# Patient Record
Sex: Female | Born: 1945 | Race: Black or African American | Hispanic: No | Marital: Single | Smoking: Never smoker
Health system: Southern US, Community
[De-identification: ages and names within clinical notes are randomized; demographics above are authoritative.]

## PROBLEM LIST (undated history)

## (undated) DIAGNOSIS — I639 Cerebral infarction, unspecified: Secondary | ICD-10-CM

## (undated) DIAGNOSIS — E119 Type 2 diabetes mellitus without complications: Secondary | ICD-10-CM

## (undated) DIAGNOSIS — I10 Essential (primary) hypertension: Secondary | ICD-10-CM

## (undated) HISTORY — PX: ABDOMINAL HYSTERECTOMY: SHX81

## (undated) HISTORY — PX: GALLBLADDER SURGERY: SHX652

---

## 2000-03-01 ENCOUNTER — Encounter: Admission: RE | Admit: 2000-03-01 | Discharge: 2000-03-01 | Payer: Self-pay | Admitting: Emergency Medicine

## 2000-03-01 ENCOUNTER — Encounter: Payer: Self-pay | Admitting: Emergency Medicine

## 2001-12-17 ENCOUNTER — Emergency Department (HOSPITAL_COMMUNITY): Admission: EM | Admit: 2001-12-17 | Discharge: 2001-12-17 | Payer: Self-pay | Admitting: Emergency Medicine

## 2001-12-17 ENCOUNTER — Encounter: Payer: Self-pay | Admitting: Emergency Medicine

## 2004-11-06 ENCOUNTER — Other Ambulatory Visit: Admission: RE | Admit: 2004-11-06 | Discharge: 2004-11-06 | Payer: Self-pay | Admitting: Internal Medicine

## 2004-11-09 ENCOUNTER — Encounter: Admission: RE | Admit: 2004-11-09 | Discharge: 2004-11-09 | Payer: Self-pay | Admitting: Internal Medicine

## 2005-05-22 ENCOUNTER — Ambulatory Visit (HOSPITAL_COMMUNITY): Admission: RE | Admit: 2005-05-22 | Discharge: 2005-05-22 | Payer: Self-pay | Admitting: Gastroenterology

## 2006-01-29 ENCOUNTER — Encounter: Admission: RE | Admit: 2006-01-29 | Discharge: 2006-01-29 | Payer: Self-pay | Admitting: Internal Medicine

## 2006-02-11 ENCOUNTER — Encounter: Admission: RE | Admit: 2006-02-11 | Discharge: 2006-02-11 | Payer: Self-pay | Admitting: Internal Medicine

## 2006-03-25 ENCOUNTER — Encounter: Admission: RE | Admit: 2006-03-25 | Discharge: 2006-03-25 | Payer: Self-pay | Admitting: General Surgery

## 2006-03-27 ENCOUNTER — Encounter (INDEPENDENT_AMBULATORY_CARE_PROVIDER_SITE_OTHER): Payer: Self-pay | Admitting: Specialist

## 2006-03-27 ENCOUNTER — Ambulatory Visit (HOSPITAL_BASED_OUTPATIENT_CLINIC_OR_DEPARTMENT_OTHER): Admission: RE | Admit: 2006-03-27 | Discharge: 2006-03-27 | Payer: Self-pay | Admitting: General Surgery

## 2007-03-21 ENCOUNTER — Encounter: Admission: RE | Admit: 2007-03-21 | Discharge: 2007-03-21 | Payer: Self-pay | Admitting: Internal Medicine

## 2008-01-15 ENCOUNTER — Emergency Department (HOSPITAL_COMMUNITY): Admission: EM | Admit: 2008-01-15 | Discharge: 2008-01-15 | Payer: Self-pay | Admitting: Emergency Medicine

## 2009-09-10 ENCOUNTER — Emergency Department (HOSPITAL_COMMUNITY)
Admission: EM | Admit: 2009-09-10 | Discharge: 2009-09-10 | Payer: Self-pay | Source: Home / Self Care | Admitting: Emergency Medicine

## 2010-06-27 ENCOUNTER — Inpatient Hospital Stay (HOSPITAL_COMMUNITY)
Admission: EM | Admit: 2010-06-27 | Discharge: 2010-06-30 | Disposition: A | Discharge status: Rehabilitation Facility | Payer: Self-pay | Source: Home / Self Care | Attending: Internal Medicine | Admitting: Internal Medicine

## 2010-06-28 ENCOUNTER — Encounter (INDEPENDENT_AMBULATORY_CARE_PROVIDER_SITE_OTHER): Payer: Self-pay | Admitting: Internal Medicine

## 2010-06-30 ENCOUNTER — Inpatient Hospital Stay (HOSPITAL_COMMUNITY)
Admission: RE | Admit: 2010-06-30 | Discharge: 2010-07-25 | Payer: Self-pay | Attending: Physical Medicine & Rehabilitation | Admitting: Physical Medicine & Rehabilitation

## 2010-07-19 LAB — GLUCOSE, CAPILLARY
Glucose-Capillary: 113 mg/dL — ABNORMAL HIGH (ref 70–99)
Glucose-Capillary: 169 mg/dL — ABNORMAL HIGH (ref 70–99)
Glucose-Capillary: 111 mg/dL — ABNORMAL HIGH (ref 70–99)
Glucose-Capillary: 107 mg/dL — ABNORMAL HIGH (ref 70–99)

## 2010-07-20 LAB — GLUCOSE, CAPILLARY
Glucose-Capillary: 109 mg/dL — ABNORMAL HIGH (ref 70–99)
Glucose-Capillary: 106 mg/dL — ABNORMAL HIGH (ref 70–99)
Glucose-Capillary: 95 mg/dL (ref 70–99)
Glucose-Capillary: 169 mg/dL — ABNORMAL HIGH (ref 70–99)

## 2010-07-21 LAB — GLUCOSE, CAPILLARY
Glucose-Capillary: 124 mg/dL — ABNORMAL HIGH (ref 70–99)
Glucose-Capillary: 87 mg/dL (ref 70–99)

## 2010-07-31 LAB — BASIC METABOLIC PANEL
BUN: 22 mg/dL (ref 6–23)
CO2: 28 mEq/L (ref 19–32)
Calcium: 9.5 mg/dL (ref 8.4–10.5)
Chloride: 108 mEq/L (ref 96–112)
Creatinine, Ser: 0.96 mg/dL (ref 0.4–1.2)
GFR calc Af Amer: >60 mL/min (ref >60)
GFR calc non Af Amer: 59 mL/min — ABNORMAL LOW (ref >60)
Glucose, Bld: 110 mg/dL — ABNORMAL HIGH (ref 70–99)
Potassium: 4.1 mEq/L (ref 3.5–5.1)
Sodium: 140 mEq/L (ref 135–145)

## 2010-07-31 LAB — URINE MICROSCOPIC-ADD ON

## 2010-07-31 LAB — GLUCOSE, CAPILLARY
Glucose-Capillary: 98 mg/dL (ref 70–99)
Glucose-Capillary: 137 mg/dL — ABNORMAL HIGH (ref 70–99)
Glucose-Capillary: 144 mg/dL — ABNORMAL HIGH (ref 70–99)
Glucose-Capillary: 118 mg/dL — ABNORMAL HIGH (ref 70–99)
Glucose-Capillary: 94 mg/dL (ref 70–99)

## 2010-07-31 LAB — URINALYSIS, ROUTINE W REFLEX MICROSCOPIC
Bilirubin Urine: NEGATIVE
Hgb urine dipstick: NEGATIVE
Ketones, ur: NEGATIVE mg/dL
Nitrite: NEGATIVE
Protein, ur: NEGATIVE mg/dL
Specific Gravity, Urine: 1.021 (ref 1.005–1.030)
Urine Glucose, Fasting: NEGATIVE mg/dL
Urobilinogen, UA: 0.2 mg/dL (ref 0.0–1.0)
pH: 5.5 (ref 5.0–8.0)

## 2010-07-31 LAB — URINE CULTURE
Colony Count: NO GROWTH
Culture  Setup Time: 201201091442
Culture: NO GROWTH

## 2010-08-24 ENCOUNTER — Inpatient Hospital Stay: Payer: Self-pay | Admitting: Physical Medicine & Rehabilitation

## 2010-08-24 ENCOUNTER — Encounter: Payer: Self-pay | Attending: Physical Medicine & Rehabilitation

## 2010-09-25 LAB — GLUCOSE, CAPILLARY
Glucose-Capillary: 102 mg/dL — ABNORMAL HIGH (ref 70–99)
Glucose-Capillary: 103 mg/dL — ABNORMAL HIGH (ref 70–99)
Glucose-Capillary: 109 mg/dL — ABNORMAL HIGH (ref 70–99)
Glucose-Capillary: 110 mg/dL — ABNORMAL HIGH (ref 70–99)
Glucose-Capillary: 114 mg/dL — ABNORMAL HIGH (ref 70–99)
Glucose-Capillary: 116 mg/dL — ABNORMAL HIGH (ref 70–99)
Glucose-Capillary: 117 mg/dL — ABNORMAL HIGH (ref 70–99)
Glucose-Capillary: 118 mg/dL — ABNORMAL HIGH (ref 70–99)
Glucose-Capillary: 122 mg/dL — ABNORMAL HIGH (ref 70–99)
Glucose-Capillary: 125 mg/dL — ABNORMAL HIGH (ref 70–99)
Glucose-Capillary: 125 mg/dL — ABNORMAL HIGH (ref 70–99)
Glucose-Capillary: 126 mg/dL — ABNORMAL HIGH (ref 70–99)
Glucose-Capillary: 131 mg/dL — ABNORMAL HIGH (ref 70–99)
Glucose-Capillary: 134 mg/dL — ABNORMAL HIGH (ref 70–99)
Glucose-Capillary: 138 mg/dL — ABNORMAL HIGH (ref 70–99)
Glucose-Capillary: 141 mg/dL — ABNORMAL HIGH (ref 70–99)
Glucose-Capillary: 142 mg/dL — ABNORMAL HIGH (ref 70–99)
Glucose-Capillary: 143 mg/dL — ABNORMAL HIGH (ref 70–99)
Glucose-Capillary: 143 mg/dL — ABNORMAL HIGH (ref 70–99)
Glucose-Capillary: 146 mg/dL — ABNORMAL HIGH (ref 70–99)
Glucose-Capillary: 147 mg/dL — ABNORMAL HIGH (ref 70–99)
Glucose-Capillary: 148 mg/dL — ABNORMAL HIGH (ref 70–99)
Glucose-Capillary: 149 mg/dL — ABNORMAL HIGH (ref 70–99)
Glucose-Capillary: 150 mg/dL — ABNORMAL HIGH (ref 70–99)
Glucose-Capillary: 152 mg/dL — ABNORMAL HIGH (ref 70–99)
Glucose-Capillary: 154 mg/dL — ABNORMAL HIGH (ref 70–99)
Glucose-Capillary: 155 mg/dL — ABNORMAL HIGH (ref 70–99)
Glucose-Capillary: 156 mg/dL — ABNORMAL HIGH (ref 70–99)
Glucose-Capillary: 159 mg/dL — ABNORMAL HIGH (ref 70–99)
Glucose-Capillary: 160 mg/dL — ABNORMAL HIGH (ref 70–99)
Glucose-Capillary: 160 mg/dL — ABNORMAL HIGH (ref 70–99)
Glucose-Capillary: 160 mg/dL — ABNORMAL HIGH (ref 70–99)
Glucose-Capillary: 165 mg/dL — ABNORMAL HIGH (ref 70–99)
Glucose-Capillary: 166 mg/dL — ABNORMAL HIGH (ref 70–99)
Glucose-Capillary: 166 mg/dL — ABNORMAL HIGH (ref 70–99)
Glucose-Capillary: 171 mg/dL — ABNORMAL HIGH (ref 70–99)
Glucose-Capillary: 173 mg/dL — ABNORMAL HIGH (ref 70–99)
Glucose-Capillary: 175 mg/dL — ABNORMAL HIGH (ref 70–99)
Glucose-Capillary: 177 mg/dL — ABNORMAL HIGH (ref 70–99)
Glucose-Capillary: 183 mg/dL — ABNORMAL HIGH (ref 70–99)
Glucose-Capillary: 185 mg/dL — ABNORMAL HIGH (ref 70–99)
Glucose-Capillary: 191 mg/dL — ABNORMAL HIGH (ref 70–99)
Glucose-Capillary: 192 mg/dL — ABNORMAL HIGH (ref 70–99)
Glucose-Capillary: 195 mg/dL — ABNORMAL HIGH (ref 70–99)
Glucose-Capillary: 195 mg/dL — ABNORMAL HIGH (ref 70–99)
Glucose-Capillary: 217 mg/dL — ABNORMAL HIGH (ref 70–99)
Glucose-Capillary: 218 mg/dL — ABNORMAL HIGH (ref 70–99)
Glucose-Capillary: 79 mg/dL (ref 70–99)

## 2010-09-25 LAB — CK TOTAL AND CKMB (NOT AT ARMC)
CK, MB: 2.2 ng/mL (ref 0.3–4.0)
Total CK: 153 U/L (ref 7–177)

## 2010-09-25 LAB — URINALYSIS, ROUTINE W REFLEX MICROSCOPIC
Glucose, UA: NEGATIVE mg/dL
Specific Gravity, Urine: 1.017 (ref 1.005–1.030)
pH: 6 (ref 5.0–8.0)

## 2010-09-25 LAB — BASIC METABOLIC PANEL
BUN: 16 mg/dL (ref 6–23)
BUN: 22 mg/dL (ref 6–23)
BUN: 8 mg/dL (ref 6–23)
CO2: 26 mEq/L (ref 19–32)
CO2: 28 mEq/L (ref 19–32)
Chloride: 104 mEq/L (ref 96–112)
Chloride: 105 mEq/L (ref 96–112)
Chloride: 107 mEq/L (ref 96–112)
Creatinine, Ser: 0.88 mg/dL (ref 0.4–1.2)
GFR calc non Af Amer: 53 mL/min — ABNORMAL LOW (ref >60)
GFR calc non Af Amer: >60 mL/min (ref >60)
Glucose, Bld: 126 mg/dL — ABNORMAL HIGH (ref 70–99)
Glucose, Bld: 139 mg/dL — ABNORMAL HIGH (ref 70–99)
Glucose, Bld: 150 mg/dL — ABNORMAL HIGH (ref 70–99)
Potassium: 3.7 mEq/L (ref 3.5–5.1)
Potassium: 4.3 mEq/L (ref 3.5–5.1)
Sodium: 139 mEq/L (ref 135–145)
Sodium: 141 mEq/L (ref 135–145)

## 2010-09-25 LAB — DIFFERENTIAL
Eosinophils Absolute: 0 10*3/uL (ref 0.0–0.7)
Eosinophils Absolute: 0.1 10*3/uL (ref 0.0–0.7)
Eosinophils Relative: 2 % (ref 0–5)
Lymphocytes Relative: 31 % (ref 12–46)
Lymphocytes Relative: 35 % (ref 12–46)
Lymphs Abs: 2.3 10*3/uL (ref 0.7–4.0)
Monocytes Absolute: 0.3 10*3/uL (ref 0.1–1.0)
Monocytes Absolute: 0.5 10*3/uL (ref 0.1–1.0)
Monocytes Relative: 7 % (ref 3–12)
Neutrophils Relative %: 65 % (ref 43–77)
Smear Review: ADEQUATE

## 2010-09-25 LAB — URINE CULTURE
Colony Count: NO GROWTH
Culture  Setup Time: 201112130011
Culture  Setup Time: 201112170306
Culture: NO GROWTH

## 2010-09-25 LAB — COMPREHENSIVE METABOLIC PANEL
ALT: 14 U/L (ref 0–35)
ALT: 15 U/L (ref 0–35)
AST: 16 U/L (ref 0–37)
AST: 23 U/L (ref 0–37)
Albumin: 2.9 g/dL — ABNORMAL LOW (ref 3.5–5.2)
Albumin: 2.9 g/dL — ABNORMAL LOW (ref 3.5–5.2)
Alkaline Phosphatase: 93 U/L (ref 39–117)
BUN: 14 mg/dL (ref 6–23)
CO2: 27 mEq/L (ref 19–32)
CO2: 28 mEq/L (ref 19–32)
Calcium: 9.4 mg/dL (ref 8.4–10.5)
Calcium: 9.5 mg/dL (ref 8.4–10.5)
Chloride: 102 mEq/L (ref 96–112)
Creatinine, Ser: 0.82 mg/dL (ref 0.4–1.2)
GFR calc Af Amer: >60 mL/min (ref >60)
GFR calc Af Amer: >60 mL/min (ref >60)
GFR calc non Af Amer: 54 mL/min — ABNORMAL LOW (ref >60)
GFR calc non Af Amer: 56 mL/min — ABNORMAL LOW (ref >60)
Glucose, Bld: 180 mg/dL — ABNORMAL HIGH (ref 70–99)
Potassium: 2.9 mEq/L — ABNORMAL LOW (ref 3.5–5.1)
Sodium: 140 mEq/L (ref 135–145)
Sodium: 142 mEq/L (ref 135–145)
Total Bilirubin: 0.5 mg/dL (ref 0.3–1.2)
Total Protein: 6.8 g/dL (ref 6.0–8.3)

## 2010-09-25 LAB — URINALYSIS, MICROSCOPIC ONLY
Bilirubin Urine: NEGATIVE
Bilirubin Urine: NEGATIVE
Glucose, UA: NEGATIVE mg/dL
Glucose, UA: NEGATIVE mg/dL
Nitrite: NEGATIVE
Specific Gravity, Urine: 1.023 (ref 1.005–1.030)
Specific Gravity, Urine: 1.023 (ref 1.005–1.030)
Urobilinogen, UA: 1 mg/dL (ref 0.0–1.0)
pH: 5.5 (ref 5.0–8.0)
pH: 6 (ref 5.0–8.0)

## 2010-09-25 LAB — CBC
Hemoglobin: 11.6 g/dL — ABNORMAL LOW (ref 12.0–15.0)
MCH: 22.8 pg — ABNORMAL LOW (ref 26.0–34.0)
MCH: 23.3 pg — ABNORMAL LOW (ref 26.0–34.0)
MCH: 23.8 pg — ABNORMAL LOW (ref 26.0–34.0)
MCHC: 33.9 g/dL (ref 30.0–36.0)
MCV: 69.5 fL — ABNORMAL LOW (ref 78.0–100.0)
Platelets: 146 10*3/uL — ABNORMAL LOW (ref 150–400)
Platelets: 160 10*3/uL (ref 150–400)
Platelets: 166 10*3/uL (ref 150–400)
Platelets: 177 10*3/uL (ref 150–400)
RBC: 4.45 MIL/uL (ref 3.87–5.11)
RBC: 4.85 MIL/uL (ref 3.87–5.11)
RBC: 4.92 MIL/uL (ref 3.87–5.11)
RDW: 17.9 % — ABNORMAL HIGH (ref 11.5–15.5)
WBC: 7.3 10*3/uL (ref 4.0–10.5)
WBC: 8 10*3/uL (ref 4.0–10.5)

## 2010-09-25 LAB — MAGNESIUM: Magnesium: 1.8 mg/dL (ref 1.5–2.5)

## 2010-09-25 LAB — CARDIAC PANEL(CRET KIN+CKTOT+MB+TROPI)
CK, MB: 1.4 ng/mL (ref 0.3–4.0)
Relative Index: 1.3 (ref 0.0–2.5)
Total CK: 108 U/L (ref 7–177)

## 2010-09-25 LAB — PROTIME-INR
INR: 1.11 (ref 0.00–1.49)
Prothrombin Time: 14.5 seconds (ref 11.6–15.2)

## 2010-11-17 ENCOUNTER — Ambulatory Visit: Payer: Medicare Other | Attending: Internal Medicine | Admitting: Rehabilitative and Restorative Service Providers"

## 2010-11-17 DIAGNOSIS — IMO0001 Reserved for inherently not codable concepts without codable children: Secondary | ICD-10-CM | POA: Insufficient documentation

## 2010-11-17 DIAGNOSIS — R269 Unspecified abnormalities of gait and mobility: Secondary | ICD-10-CM | POA: Insufficient documentation

## 2010-11-17 DIAGNOSIS — M6281 Muscle weakness (generalized): Secondary | ICD-10-CM | POA: Insufficient documentation

## 2010-11-24 ENCOUNTER — Ambulatory Visit: Payer: Medicare Other | Admitting: *Deleted

## 2010-11-28 ENCOUNTER — Ambulatory Visit: Payer: Medicare Other | Admitting: Rehabilitative and Restorative Service Providers"

## 2010-11-29 ENCOUNTER — Ambulatory Visit: Payer: Medicare Other | Admitting: Rehabilitative and Restorative Service Providers"

## 2010-12-01 ENCOUNTER — Ambulatory Visit: Payer: Medicare Other | Admitting: Rehabilitative and Restorative Service Providers"

## 2010-12-01 NOTE — Op Note (Signed)
Laurie Hunter, Laurie Hunter                ACCOUNT NO.:  [id number]   MEDICAL RECORD NO.:  [id number]          PATIENT TYPE:  AMB   LOCATION:  DSC                          FACILITY:  [facility]   PHYSICIAN:  Leonie Man, M.D.   DATE OF BIRTH:  [birth year]   DATE OF PROCEDURE:  03/27/2006  DATE OF DISCHARGE:                                 OPERATIVE REPORT   PREOPERATIVE DIAGNOSIS:  Duct papillomatosis, left breast.   POSTOPERATIVE DIAGNOSIS:  Duct papillomatosis, left breast.   PROCEDURE:  Major duct excision, left breast.   SURGEON:  Dr. Leonie Man.   ASSISTANT:  OR nurse.   ANESTHESIA:  General.   INDICATIONS:  The patient is a 65 year old female who presents with a clear  left-sided nipple discharge which appeared spontaneously. She underwent  ductogram which showed a papilloma or filling defects within the major ducts  of the left breast.  She comes to the operating room now for duct excision  to rule out a papillary carcinoma.   DESCRIPTION OF PROCEDURE:  Following the induction of satisfactory general  anesthesia with the patient positioned supinely, the left breast was prepped  and draped to be included in a sterile operative field. An inferiorly placed  circumareolar incision is made deep and through the skin and subcutaneous  tissue with the nipple lifted superiorly as a flap. All of the ducts below  the nipple are taken down and dissection carried down to the central portion  of the breast for approximately 5-1/2 cm. The specimen was removed in its  entirety and forwarded for pathologic evaluation.  Hemostasis obtained with  electrocautery.  Sponge and instrument counts verified.  The breast tissue  is reapproximated with two concentric pursestring sutures pulling the breast  tissues together.  The subcutaneous tissues are then closed with interrupted  3-0 Vicryl suture. The skin is closed with running 5-0 Monocryl suture.  The  wound is reinforced with Steri-Strips,  sterile dressing is applied.  The  anesthetic reversed and the patient removed from the operating room to the  recovery room in stable condition.  He tolerated the procedure well.      Leonie Man, M.D.  Electronically Signed     PB/MEDQ  D:  03/27/2006  T:  03/27/2006  Job:  [id number]

## 2010-12-01 NOTE — Op Note (Signed)
NAMEXIAN, APOSTOL                ACCOUNT NO.:  [id number]   MEDICAL RECORD NO.:  [id number]          PATIENT TYPE:  AMB   LOCATION:  ENDO                         FACILITY:  [facility]   PHYSICIAN:  Danise Edge, M.D.   DATE OF BIRTH:  1945/07/22   DATE OF PROCEDURE:  05/22/2005  DATE OF DISCHARGE:                                 OPERATIVE REPORT   PROCEDURE:  Screening colonoscopy.   INDICATIONS:  Ms. Nisa Decaire is a 65 year old female born [birth year].  Ms. Mcquilkin is scheduled to undergo her first screening colonoscopy with  polypectomy to prevent colon cancer.   ENDOSCOPIST:  Danise Edge, M.D.   PREMEDICATION:  Versed 5 mg, Demerol 50 mg.   DESCRIPTION OF PROCEDURE:  After obtaining informed consent, Ms. Matarese was  placed in the left lateral decubitus position. I administered intravenous  Demerol and intravenous Versed to achieve conscious sedation for the  procedure. The patient's blood pressure, oxygen saturation and cardiac  rhythm were monitored throughout the procedure and documented in the medical  record.   Anal inspection and digital rectal exam were normal. The Olympus adjustable  pediatric colonoscope was introduced into the rectum and easily advanced to  the cecum. Colonic preparation was fair. The cecum was filled with solid  fecal matter. Wight vigorous water irrigation, I was able to inspect the  vast majority of the cecum; small polyps could easily have been missed.  There was also a large amount of solid waste in the sigmoid colon and small  polyps could have be missed in this area.   RECTUM:  Normal. Retroflexed view of the distal rectum normal.  SIGMOID COLON AND DESCENDING COLON:  Normal.  SPLENIC FLEXURE:  Normal.  TRANSVERSE COLON:  Normal.  HEPATIC FLEXURE:  Normal.  ASCENDING COLON:  Normal.  CECUM AND ILEOCECAL VALVE:  Normal.   ASSESSMENT:  Normal screening proctocolonoscopy to the cecum.           ______________________________  Danise Edge, M.D.    MJ/MEDQ  D:  05/22/2005  T:  05/22/2005  Job:  [id number]   cc:   Georgann Housekeeper, MD  Fax: [phone]

## 2010-12-04 ENCOUNTER — Ambulatory Visit: Payer: Medicare Other | Admitting: Rehabilitative and Restorative Service Providers"

## 2010-12-06 ENCOUNTER — Ambulatory Visit: Payer: Medicare Other | Admitting: Occupational Therapy

## 2010-12-06 ENCOUNTER — Ambulatory Visit: Payer: Medicare Other | Admitting: Rehabilitative and Restorative Service Providers"

## 2010-12-08 ENCOUNTER — Ambulatory Visit: Payer: Medicare Other | Admitting: Rehabilitative and Restorative Service Providers"

## 2010-12-12 ENCOUNTER — Ambulatory Visit: Payer: Medicare Other | Admitting: Rehabilitative and Restorative Service Providers"

## 2010-12-13 ENCOUNTER — Ambulatory Visit: Payer: Medicare Other | Admitting: Occupational Therapy

## 2010-12-13 ENCOUNTER — Encounter: Payer: Medicare Other | Admitting: Occupational Therapy

## 2010-12-13 ENCOUNTER — Ambulatory Visit: Payer: Medicare Other | Admitting: Rehabilitative and Restorative Service Providers"

## 2010-12-15 ENCOUNTER — Ambulatory Visit: Payer: Medicare Other | Admitting: Occupational Therapy

## 2010-12-15 ENCOUNTER — Ambulatory Visit: Payer: Medicare Other | Attending: Internal Medicine | Admitting: Rehabilitative and Restorative Service Providers"

## 2010-12-15 DIAGNOSIS — IMO0001 Reserved for inherently not codable concepts without codable children: Secondary | ICD-10-CM | POA: Insufficient documentation

## 2010-12-15 DIAGNOSIS — R269 Unspecified abnormalities of gait and mobility: Secondary | ICD-10-CM | POA: Insufficient documentation

## 2010-12-15 DIAGNOSIS — M6281 Muscle weakness (generalized): Secondary | ICD-10-CM | POA: Insufficient documentation

## 2010-12-18 ENCOUNTER — Ambulatory Visit: Payer: Medicare Other | Admitting: Rehabilitative and Restorative Service Providers"

## 2010-12-18 ENCOUNTER — Ambulatory Visit: Payer: Medicare Other | Admitting: Occupational Therapy

## 2010-12-20 ENCOUNTER — Ambulatory Visit: Payer: Medicare Other | Admitting: Occupational Therapy

## 2010-12-20 ENCOUNTER — Ambulatory Visit: Payer: Medicare Other | Admitting: Rehabilitative and Restorative Service Providers"

## 2010-12-21 ENCOUNTER — Ambulatory Visit: Payer: Medicare Other | Admitting: Occupational Therapy

## 2010-12-21 ENCOUNTER — Ambulatory Visit: Payer: Medicare Other | Admitting: Rehabilitative and Restorative Service Providers"

## 2010-12-25 ENCOUNTER — Ambulatory Visit: Payer: Medicare Other | Admitting: Occupational Therapy

## 2010-12-25 ENCOUNTER — Ambulatory Visit: Payer: Medicare Other | Admitting: Rehabilitative and Restorative Service Providers"

## 2010-12-26 ENCOUNTER — Ambulatory Visit: Payer: Medicare Other | Admitting: Occupational Therapy

## 2010-12-27 ENCOUNTER — Ambulatory Visit: Payer: Medicare Other | Admitting: Occupational Therapy

## 2010-12-27 ENCOUNTER — Ambulatory Visit: Payer: Medicare Other | Admitting: Rehabilitative and Restorative Service Providers"

## 2010-12-28 ENCOUNTER — Ambulatory Visit: Payer: Medicare Other | Admitting: Physical Therapy

## 2010-12-28 ENCOUNTER — Encounter: Payer: Medicare Other | Admitting: Occupational Therapy

## 2011-01-01 ENCOUNTER — Ambulatory Visit: Payer: Medicare Other | Admitting: Rehabilitative and Restorative Service Providers"

## 2011-01-01 ENCOUNTER — Ambulatory Visit: Payer: Medicare Other | Admitting: Occupational Therapy

## 2011-01-03 ENCOUNTER — Ambulatory Visit: Payer: Medicare Other | Admitting: Occupational Therapy

## 2011-01-03 ENCOUNTER — Ambulatory Visit: Payer: Medicare Other | Admitting: Rehabilitative and Restorative Service Providers"

## 2011-01-05 ENCOUNTER — Ambulatory Visit: Payer: Medicare Other | Admitting: Occupational Therapy

## 2011-01-05 ENCOUNTER — Ambulatory Visit: Payer: Medicare Other | Admitting: Rehabilitative and Restorative Service Providers"

## 2011-01-10 ENCOUNTER — Ambulatory Visit: Payer: Medicare Other | Admitting: Rehabilitative and Restorative Service Providers"

## 2011-01-11 ENCOUNTER — Ambulatory Visit: Payer: Medicare Other | Admitting: Occupational Therapy

## 2011-01-12 ENCOUNTER — Ambulatory Visit: Payer: Medicare Other | Admitting: Rehabilitative and Restorative Service Providers"

## 2011-01-15 ENCOUNTER — Ambulatory Visit: Payer: Medicare Other | Attending: Internal Medicine | Admitting: Occupational Therapy

## 2011-01-15 DIAGNOSIS — M6281 Muscle weakness (generalized): Secondary | ICD-10-CM | POA: Insufficient documentation

## 2011-01-15 DIAGNOSIS — R269 Unspecified abnormalities of gait and mobility: Secondary | ICD-10-CM | POA: Insufficient documentation

## 2011-01-15 DIAGNOSIS — IMO0001 Reserved for inherently not codable concepts without codable children: Secondary | ICD-10-CM | POA: Insufficient documentation

## 2011-01-19 ENCOUNTER — Ambulatory Visit: Payer: Medicare Other | Admitting: Physical Therapy

## 2011-01-23 ENCOUNTER — Ambulatory Visit: Payer: Medicare Other | Admitting: Occupational Therapy

## 2011-01-23 ENCOUNTER — Ambulatory Visit: Payer: Medicare Other | Admitting: Rehabilitative and Restorative Service Providers"

## 2011-01-24 ENCOUNTER — Ambulatory Visit: Payer: Medicare Other | Admitting: Rehabilitative and Restorative Service Providers"

## 2011-01-25 ENCOUNTER — Ambulatory Visit: Payer: Medicare Other | Admitting: Rehabilitative and Restorative Service Providers"

## 2011-01-30 ENCOUNTER — Ambulatory Visit: Payer: Medicare Other | Admitting: Rehabilitative and Restorative Service Providers"

## 2011-02-01 ENCOUNTER — Ambulatory Visit: Payer: Medicare Other | Admitting: Rehabilitative and Restorative Service Providers"

## 2011-02-07 ENCOUNTER — Ambulatory Visit: Payer: Medicare Other | Admitting: Physical Therapy

## 2011-02-07 ENCOUNTER — Ambulatory Visit: Payer: Medicare Other | Admitting: Occupational Therapy

## 2011-02-09 ENCOUNTER — Ambulatory Visit: Payer: Medicare Other | Admitting: Occupational Therapy

## 2011-02-09 ENCOUNTER — Ambulatory Visit: Payer: Medicare Other | Admitting: Rehabilitative and Restorative Service Providers"

## 2011-02-13 ENCOUNTER — Ambulatory Visit: Payer: Medicare Other | Admitting: Occupational Therapy

## 2011-02-13 ENCOUNTER — Ambulatory Visit: Payer: Medicare Other | Admitting: Rehabilitative and Restorative Service Providers"

## 2011-02-15 ENCOUNTER — Ambulatory Visit: Payer: Medicare Other | Attending: Internal Medicine | Admitting: Occupational Therapy

## 2011-02-15 ENCOUNTER — Ambulatory Visit: Payer: Medicare Other | Admitting: Physical Therapy

## 2011-02-15 DIAGNOSIS — R269 Unspecified abnormalities of gait and mobility: Secondary | ICD-10-CM | POA: Insufficient documentation

## 2011-02-15 DIAGNOSIS — M6281 Muscle weakness (generalized): Secondary | ICD-10-CM | POA: Insufficient documentation

## 2011-02-15 DIAGNOSIS — IMO0001 Reserved for inherently not codable concepts without codable children: Secondary | ICD-10-CM | POA: Insufficient documentation

## 2011-02-20 ENCOUNTER — Ambulatory Visit: Payer: Medicare Other | Admitting: Occupational Therapy

## 2011-02-20 ENCOUNTER — Ambulatory Visit: Payer: Medicare Other | Admitting: Rehabilitative and Restorative Service Providers"

## 2011-02-22 ENCOUNTER — Ambulatory Visit: Payer: Medicare Other | Admitting: Occupational Therapy

## 2011-02-26 ENCOUNTER — Ambulatory Visit: Payer: Medicare Other | Admitting: Rehabilitative and Restorative Service Providers"

## 2011-02-27 ENCOUNTER — Ambulatory Visit: Payer: Medicare Other | Admitting: Rehabilitative and Restorative Service Providers"

## 2011-03-01 ENCOUNTER — Ambulatory Visit: Payer: Medicare Other | Admitting: Physical Therapy

## 2011-03-02 ENCOUNTER — Ambulatory Visit: Payer: Medicare Other | Admitting: Physical Therapy

## 2011-03-06 ENCOUNTER — Ambulatory Visit: Payer: Medicare Other | Admitting: Rehabilitative and Restorative Service Providers"

## 2011-03-07 ENCOUNTER — Ambulatory Visit: Payer: Medicare Other | Admitting: Physical Therapy

## 2011-03-09 ENCOUNTER — Ambulatory Visit: Payer: Medicare Other | Admitting: Rehabilitative and Restorative Service Providers"

## 2011-03-13 ENCOUNTER — Ambulatory Visit: Payer: Medicare Other | Admitting: Rehabilitative and Restorative Service Providers"

## 2011-03-16 ENCOUNTER — Ambulatory Visit: Payer: Medicare Other | Admitting: Rehabilitative and Restorative Service Providers"

## 2011-03-22 ENCOUNTER — Ambulatory Visit: Payer: Medicare Other | Admitting: Rehabilitative and Restorative Service Providers"

## 2011-07-23 ENCOUNTER — Ambulatory Visit: Payer: Medicare Other | Attending: Internal Medicine | Admitting: Rehabilitative and Restorative Service Providers"

## 2011-07-23 DIAGNOSIS — IMO0001 Reserved for inherently not codable concepts without codable children: Secondary | ICD-10-CM | POA: Insufficient documentation

## 2011-07-23 DIAGNOSIS — M6281 Muscle weakness (generalized): Secondary | ICD-10-CM | POA: Insufficient documentation

## 2011-07-23 DIAGNOSIS — R269 Unspecified abnormalities of gait and mobility: Secondary | ICD-10-CM | POA: Insufficient documentation

## 2011-07-31 ENCOUNTER — Ambulatory Visit: Payer: Medicare Other | Admitting: Physical Therapy

## 2011-08-01 ENCOUNTER — Ambulatory Visit: Payer: Medicare Other | Admitting: Rehabilitative and Restorative Service Providers"

## 2011-08-07 ENCOUNTER — Ambulatory Visit: Payer: Medicare Other | Admitting: Physical Therapy

## 2011-08-09 ENCOUNTER — Ambulatory Visit: Payer: Medicare Other | Admitting: Rehabilitative and Restorative Service Providers"

## 2011-08-14 ENCOUNTER — Ambulatory Visit: Payer: Medicare Other | Admitting: Rehabilitative and Restorative Service Providers"

## 2011-08-16 ENCOUNTER — Ambulatory Visit: Payer: Medicare Other | Admitting: Rehabilitative and Restorative Service Providers"

## 2011-08-21 ENCOUNTER — Ambulatory Visit: Payer: Medicare Other | Admitting: Rehabilitative and Restorative Service Providers"

## 2011-08-23 ENCOUNTER — Ambulatory Visit: Payer: Medicare Other | Attending: Internal Medicine | Admitting: Rehabilitative and Restorative Service Providers"

## 2011-08-23 DIAGNOSIS — M6281 Muscle weakness (generalized): Secondary | ICD-10-CM | POA: Insufficient documentation

## 2011-08-23 DIAGNOSIS — IMO0001 Reserved for inherently not codable concepts without codable children: Secondary | ICD-10-CM | POA: Insufficient documentation

## 2011-08-23 DIAGNOSIS — R269 Unspecified abnormalities of gait and mobility: Secondary | ICD-10-CM | POA: Insufficient documentation

## 2011-08-24 ENCOUNTER — Ambulatory Visit: Payer: Medicare Other | Admitting: Rehabilitative and Restorative Service Providers"

## 2011-08-27 ENCOUNTER — Ambulatory Visit: Payer: Medicare Other | Admitting: Rehabilitative and Restorative Service Providers"

## 2011-08-29 ENCOUNTER — Ambulatory Visit: Payer: Medicare Other | Admitting: Rehabilitative and Restorative Service Providers"

## 2011-09-03 ENCOUNTER — Ambulatory Visit: Payer: Medicare Other | Admitting: Rehabilitative and Restorative Service Providers"

## 2011-09-05 ENCOUNTER — Ambulatory Visit: Payer: Medicare Other | Admitting: Rehabilitative and Restorative Service Providers"

## 2011-09-07 ENCOUNTER — Ambulatory Visit: Payer: Medicare Other | Admitting: Rehabilitative and Restorative Service Providers"

## 2011-09-10 ENCOUNTER — Ambulatory Visit: Payer: Medicare Other | Admitting: Rehabilitative and Restorative Service Providers"

## 2011-09-12 ENCOUNTER — Ambulatory Visit: Payer: Medicare Other | Admitting: Rehabilitative and Restorative Service Providers"

## 2011-09-17 ENCOUNTER — Ambulatory Visit: Payer: Medicare Other | Admitting: Rehabilitative and Restorative Service Providers"

## 2011-09-18 ENCOUNTER — Other Ambulatory Visit: Payer: Self-pay | Admitting: Internal Medicine

## 2011-09-18 DIAGNOSIS — Z1231 Encounter for screening mammogram for malignant neoplasm of breast: Secondary | ICD-10-CM

## 2011-09-19 ENCOUNTER — Ambulatory Visit: Payer: Medicare Other | Attending: Internal Medicine | Admitting: Rehabilitative and Restorative Service Providers"

## 2011-09-19 DIAGNOSIS — R269 Unspecified abnormalities of gait and mobility: Secondary | ICD-10-CM | POA: Insufficient documentation

## 2011-09-19 DIAGNOSIS — M6281 Muscle weakness (generalized): Secondary | ICD-10-CM | POA: Insufficient documentation

## 2011-09-19 DIAGNOSIS — IMO0001 Reserved for inherently not codable concepts without codable children: Secondary | ICD-10-CM | POA: Insufficient documentation

## 2011-09-24 ENCOUNTER — Ambulatory Visit: Payer: Medicare Other | Admitting: Rehabilitative and Restorative Service Providers"

## 2011-09-26 ENCOUNTER — Ambulatory Visit: Payer: Medicare Other | Admitting: Rehabilitative and Restorative Service Providers"

## 2011-10-03 ENCOUNTER — Ambulatory Visit: Payer: Medicare Other | Admitting: Rehabilitative and Restorative Service Providers"

## 2011-10-05 ENCOUNTER — Ambulatory Visit
Admission: RE | Admit: 2011-10-05 | Discharge: 2011-10-05 | Disposition: A | Discharge status: Home / Self Care | Payer: Medicare Other | Source: Ambulatory Visit | Attending: Internal Medicine | Admitting: Internal Medicine

## 2011-10-05 ENCOUNTER — Other Ambulatory Visit: Payer: Self-pay | Admitting: Internal Medicine

## 2011-10-05 DIAGNOSIS — Z1231 Encounter for screening mammogram for malignant neoplasm of breast: Secondary | ICD-10-CM

## 2011-10-09 ENCOUNTER — Other Ambulatory Visit: Payer: Self-pay | Admitting: Internal Medicine

## 2011-10-09 DIAGNOSIS — R928 Other abnormal and inconclusive findings on diagnostic imaging of breast: Secondary | ICD-10-CM

## 2011-10-19 ENCOUNTER — Ambulatory Visit
Admission: RE | Admit: 2011-10-19 | Discharge: 2011-10-19 | Disposition: A | Discharge status: Home / Self Care | Payer: Medicare Other | Source: Ambulatory Visit | Attending: Internal Medicine | Admitting: Internal Medicine

## 2011-10-19 DIAGNOSIS — R928 Other abnormal and inconclusive findings on diagnostic imaging of breast: Secondary | ICD-10-CM

## 2012-10-17 ENCOUNTER — Emergency Department (HOSPITAL_COMMUNITY)
Admission: EM | Admit: 2012-10-17 | Discharge: 2012-10-17 | Disposition: A | Discharge status: Home / Self Care | Payer: Medicare Other | Attending: Emergency Medicine | Admitting: Emergency Medicine

## 2012-10-17 ENCOUNTER — Encounter (HOSPITAL_COMMUNITY): Payer: Self-pay | Admitting: Emergency Medicine

## 2012-10-17 DIAGNOSIS — T46905A Adverse effect of unspecified agents primarily affecting the cardiovascular system, initial encounter: Secondary | ICD-10-CM | POA: Insufficient documentation

## 2012-10-17 DIAGNOSIS — I10 Essential (primary) hypertension: Secondary | ICD-10-CM | POA: Insufficient documentation

## 2012-10-17 DIAGNOSIS — T783XXA Angioneurotic edema, initial encounter: Secondary | ICD-10-CM

## 2012-10-17 DIAGNOSIS — E119 Type 2 diabetes mellitus without complications: Secondary | ICD-10-CM | POA: Insufficient documentation

## 2012-10-17 DIAGNOSIS — Z7982 Long term (current) use of aspirin: Secondary | ICD-10-CM | POA: Insufficient documentation

## 2012-10-17 DIAGNOSIS — IMO0002 Reserved for concepts with insufficient information to code with codable children: Secondary | ICD-10-CM | POA: Insufficient documentation

## 2012-10-17 DIAGNOSIS — Z79899 Other long term (current) drug therapy: Secondary | ICD-10-CM | POA: Insufficient documentation

## 2012-10-17 DIAGNOSIS — Z8673 Personal history of transient ischemic attack (TIA), and cerebral infarction without residual deficits: Secondary | ICD-10-CM | POA: Insufficient documentation

## 2012-10-17 DIAGNOSIS — Z7902 Long term (current) use of antithrombotics/antiplatelets: Secondary | ICD-10-CM | POA: Insufficient documentation

## 2012-10-17 HISTORY — DX: Cerebral infarction, unspecified: I63.9

## 2012-10-17 HISTORY — DX: Type 2 diabetes mellitus without complications: E11.9

## 2012-10-17 HISTORY — DX: Essential (primary) hypertension: I10

## 2012-10-17 MED ORDER — FAMOTIDINE 20 MG PO TABS: 20.0000 mg | ORAL_TABLET | Freq: Two times a day (BID) | ORAL | Status: AC

## 2012-10-17 MED ORDER — PREDNISONE 20 MG PO TABS: 40.0000 mg | ORAL_TABLET | Freq: Every day | ORAL | Status: DC

## 2012-10-17 MED ORDER — FAMOTIDINE 20 MG PO TABS
20.0000 mg | ORAL_TABLET | Freq: Once | ORAL | Status: AC
  Administered 2012-10-17: 20 mg via ORAL
  Filled 2012-10-17: qty 1

## 2012-10-17 MED ORDER — PREDNISONE 20 MG PO TABS
40.0000 mg | ORAL_TABLET | Freq: Once | ORAL | Status: AC
  Administered 2012-10-17: 40 mg via ORAL
  Filled 2012-10-17: qty 2

## 2012-10-17 MED ORDER — DIPHENHYDRAMINE HCL 25 MG PO CAPS
25.0000 mg | ORAL_CAPSULE | Freq: Once | ORAL | Status: AC
  Administered 2012-10-17: 25 mg via ORAL
  Filled 2012-10-17: qty 1

## 2012-10-17 MED ORDER — DIPHENHYDRAMINE HCL 25 MG PO TABS: 25.0000 mg | ORAL_TABLET | Freq: Four times a day (QID) | ORAL | Status: DC | PRN

## 2012-10-17 NOTE — ED Notes (Signed)
Pt from home with reports of angioedema that started this morning. Pt denies airway swelling/difficulty, no acute resp distress noted. Pt endorses taking Lisinopril.

## 2012-10-17 NOTE — ED Provider Notes (Signed)
History     CSN: [id number]  Arrival date & time 10/17/12  1627   First MD Initiated Contact with Patient 10/17/12 1644      Chief Complaint  Patient presents with  . Angioedema    (Consider location/radiation/quality/duration/timing/severity/associated sxs/prior treatment) HPI Comments: 67 year old female with a history of hypertension and takes an ACE inhibitor who presents with approximately 12 hours of upper lip swelling. This started this morning when she woke up, has been persistent throughout the day and this only gradually worsening. There is no associated rash, itching, swelling of the tongue, shortness of breath or wheezing. Nothing seems to make this better or worse  The history is provided by the patient and a relative.    Past Medical History  Diagnosis Date  . Hypertension   . Diabetes mellitus without complication   . Stroke     Past Surgical History  Procedure Laterality Date  . Gallbladder surgery    . Abdominal hysterectomy      No family history on file.  History  Substance Use Topics  . Smoking status: Never Smoker   . Smokeless tobacco: Not on file  . Alcohol Use: No    OB History   Grav Para Term Preterm Abortions TAB SAB Ect Mult Living                  Review of Systems  All other systems reviewed and are negative.    Allergies  Erythromycin and Penicillins  Home Medications   Current Outpatient Rx  Name  Route  Sig  Dispense  Refill  . alendronate (FOSAMAX) 70 MG tablet   Oral   Take 70 mg by mouth every 7 (seven) days. Friday. Take with a full glass of water on an empty stomach.         Marland Kitchen allopurinol (ZYLOPRIM) 100 MG tablet   Oral   Take 100 mg by mouth daily.         Marland Kitchen amLODipine (NORVASC) 10 MG tablet   Oral   Take 10 mg by mouth daily.         Marland Kitchen aspirin 81 MG tablet   Oral   Take 162 mg by mouth daily.         Marland Kitchen atenolol (TENORMIN) 50 MG tablet   Oral   Take 25 mg by mouth daily.         .  clopidogrel (PLAVIX) 75 MG tablet   Oral   Take 75 mg by mouth daily.         Marland Kitchen glipiZIDE (GLUCOTROL) 5 MG tablet   Oral   Take 5 mg by mouth daily.         . hydrochlorothiazide (HYDRODIURIL) 25 MG tablet   Oral   Take 25 mg by mouth daily.         . metFORMIN (GLUCOPHAGE) 500 MG tablet   Oral   Take 500 mg by mouth 2 (two) times daily with a meal.         . pravastatin (PRAVACHOL) 40 MG tablet   Oral   Take 40 mg by mouth daily.         . diphenhydrAMINE (BENADRYL) 25 MG tablet   Oral   Take 1 tablet (25 mg total) by mouth every 6 (six) hours as needed for itching (Rash).   30 tablet   0   . famotidine (PEPCID) 20 MG tablet   Oral   Take 1 tablet (20 mg total) by  mouth 2 (two) times daily.   30 tablet   0   . predniSONE (DELTASONE) 20 MG tablet   Oral   Take 2 tablets (40 mg total) by mouth daily.   10 tablet   0     BP 160/73  Pulse 74  Temp(Src) 98.5 F (36.9 C) (Oral)  Resp 18  SpO2 100%  Physical Exam  Nursing note and vitals reviewed. Constitutional: She appears well-developed and well-nourished. No distress.  HENT:  Head: Normocephalic and atraumatic.  Mouth/Throat: Oropharynx is clear and moist. No oropharyngeal exudate.  Upper lip is swollen consistent with angioedema, no tenderness, no redness, oropharynx is clear, tongue normal, voice normal  Eyes: Conjunctivae and EOM are normal. Pupils are equal, round, and reactive to light. Right eye exhibits no discharge. Left eye exhibits no discharge. No scleral icterus.  Neck: Normal range of motion. Neck supple. No JVD present. No thyromegaly present.  Cardiovascular: Normal rate, regular rhythm, normal heart sounds and intact distal pulses.  Exam reveals no gallop and no friction rub.   No murmur heard. Pulmonary/Chest: Effort normal and breath sounds normal. No respiratory distress. She has no wheezes. She has no rales.  Abdominal: Soft. Bowel sounds are normal. She exhibits no distension and  no mass. There is no tenderness.  Musculoskeletal: Normal range of motion. She exhibits no edema and no tenderness.  Lymphadenopathy:    She has no cervical adenopathy.  Neurological: She is alert. Coordination normal.  Skin: Skin is warm and dry. No rash noted. No erythema.  Psychiatric: She has a normal mood and affect. Her behavior is normal.    ED Course  Procedures (including critical care time)  Labs Reviewed - No data to display No results found.   1. Angioedema of lips, initial encounter       MDM  Lungs and heart appear normal, there is no wheezing, no difficulty breathing, isolated angioedema to the upper lip consistent with ACE inhibitor angioedema, counseled the patient on her use of lisinopril, Pepcid, Benadryl, prednisone, observe for short time to make sure not getting worse, patient has expressed her understanding.  The patient appears stable, she has had no significant worsening in her symptoms over the last 3 hours. She has been given Pepcid, Benadryl, prednisone, advised to never take lisinopril again, she states her understanding and will be discharged with followup with her family doctor for further blood pressure medication adjustments.      Vida Roller, MD 10/17/12 [phone]

## 2013-05-19 ENCOUNTER — Ambulatory Visit: Payer: Self-pay

## 2013-08-24 ENCOUNTER — Ambulatory Visit: Payer: Medicare Other | Attending: Internal Medicine

## 2013-08-24 DIAGNOSIS — Z9181 History of falling: Secondary | ICD-10-CM | POA: Insufficient documentation

## 2013-08-24 DIAGNOSIS — R279 Unspecified lack of coordination: Secondary | ICD-10-CM | POA: Insufficient documentation

## 2013-08-24 DIAGNOSIS — R29898 Other symptoms and signs involving the musculoskeletal system: Secondary | ICD-10-CM | POA: Insufficient documentation

## 2013-08-24 DIAGNOSIS — I890 Lymphedema, not elsewhere classified: Secondary | ICD-10-CM | POA: Insufficient documentation

## 2013-08-24 DIAGNOSIS — IMO0001 Reserved for inherently not codable concepts without codable children: Secondary | ICD-10-CM | POA: Insufficient documentation

## 2013-08-24 DIAGNOSIS — I69998 Other sequelae following unspecified cerebrovascular disease: Secondary | ICD-10-CM | POA: Insufficient documentation

## 2013-08-24 DIAGNOSIS — R262 Difficulty in walking, not elsewhere classified: Secondary | ICD-10-CM | POA: Insufficient documentation

## 2013-08-31 ENCOUNTER — Ambulatory Visit: Payer: Medicare Other

## 2013-09-03 ENCOUNTER — Ambulatory Visit: Payer: Medicare Other

## 2013-09-07 ENCOUNTER — Ambulatory Visit: Payer: Medicare Other

## 2013-09-10 ENCOUNTER — Ambulatory Visit: Payer: Medicare Other

## 2013-09-14 ENCOUNTER — Ambulatory Visit: Payer: Medicare Other | Attending: Internal Medicine

## 2013-09-14 DIAGNOSIS — R279 Unspecified lack of coordination: Secondary | ICD-10-CM | POA: Insufficient documentation

## 2013-09-14 DIAGNOSIS — I69998 Other sequelae following unspecified cerebrovascular disease: Secondary | ICD-10-CM | POA: Insufficient documentation

## 2013-09-14 DIAGNOSIS — I890 Lymphedema, not elsewhere classified: Secondary | ICD-10-CM | POA: Insufficient documentation

## 2013-09-14 DIAGNOSIS — Z9181 History of falling: Secondary | ICD-10-CM | POA: Insufficient documentation

## 2013-09-14 DIAGNOSIS — R29898 Other symptoms and signs involving the musculoskeletal system: Secondary | ICD-10-CM | POA: Insufficient documentation

## 2013-09-14 DIAGNOSIS — IMO0001 Reserved for inherently not codable concepts without codable children: Secondary | ICD-10-CM | POA: Insufficient documentation

## 2013-09-14 DIAGNOSIS — R262 Difficulty in walking, not elsewhere classified: Secondary | ICD-10-CM | POA: Insufficient documentation

## 2013-09-17 ENCOUNTER — Ambulatory Visit: Payer: Medicare Other | Admitting: Physical Therapy

## 2013-09-21 ENCOUNTER — Ambulatory Visit: Payer: Medicare Other

## 2013-09-24 ENCOUNTER — Ambulatory Visit: Payer: Medicare Other

## 2013-11-10 ENCOUNTER — Other Ambulatory Visit: Payer: Self-pay | Admitting: Internal Medicine

## 2013-11-10 DIAGNOSIS — N63 Unspecified lump in unspecified breast: Secondary | ICD-10-CM

## 2013-11-20 ENCOUNTER — Ambulatory Visit
Admission: RE | Admit: 2013-11-20 | Discharge: 2013-11-20 | Disposition: A | Discharge status: Home / Self Care | Payer: Medicare Other | Source: Ambulatory Visit | Attending: Internal Medicine | Admitting: Internal Medicine

## 2013-11-20 ENCOUNTER — Other Ambulatory Visit: Payer: Self-pay | Admitting: Internal Medicine

## 2013-11-20 DIAGNOSIS — N63 Unspecified lump in unspecified breast: Secondary | ICD-10-CM

## 2013-12-14 ENCOUNTER — Ambulatory Visit (HOSPITAL_COMMUNITY): Payer: Medicare Other | Admitting: Physical Therapy

## 2013-12-17 ENCOUNTER — Ambulatory Visit (HOSPITAL_COMMUNITY)
Admission: RE | Admit: 2013-12-17 | Discharge: 2013-12-17 | Disposition: A | Discharge status: Home / Self Care | Payer: Medicare Other | Source: Ambulatory Visit | Attending: Internal Medicine | Admitting: Internal Medicine

## 2013-12-17 DIAGNOSIS — R262 Difficulty in walking, not elsewhere classified: Secondary | ICD-10-CM | POA: Insufficient documentation

## 2013-12-17 DIAGNOSIS — IMO0001 Reserved for inherently not codable concepts without codable children: Secondary | ICD-10-CM | POA: Insufficient documentation

## 2013-12-17 DIAGNOSIS — I890 Lymphedema, not elsewhere classified: Secondary | ICD-10-CM | POA: Insufficient documentation

## 2013-12-17 NOTE — Evaluation (Signed)
Physical Therapy Evaluation  Patient Details  Name: KHANIA PASTERNACK MRN: [id number] Date of Birth: [birth year]  Today's Date: 12/17/2013 Time: 1015-1055 PT Time Calculation (min): 40 min              Visit#: 1 of 13  Re-eval: 01/16/14 Assessment Diagnosis: B Edema  Authorization: [employer] medicare    Authorization Visit#: 1 of 10   Past Medical History:  Past Medical History  Diagnosis Date  . Hypertension   . Diabetes mellitus without complication   . Stroke    Past Surgical History:  Past Surgical History  Procedure Laterality Date  . Gallbladder surgery    . Abdominal hysterectomy      Subjective Symptoms/Limitations Symptoms: Ms Compres states that she lives at home with her sister and brother.  She states she had a stroke in 2011 causing her to have decreased dorsiflexion and needing to wear a brace on her left LE.  She states  she gained weight but she has lost a significant amount of weight in the past year but her thighs remain very swollen.  She has been referred to PT for lymphedema however true lymphedma the swelling will be the greatest distally; Ms. Willcutt edema is in her thighs but does not involve her feet or calves .  She states her legs are big in the morning and decrease slightly when she walks.  Her primary MD is Dr. Jerelyn Scott with [facility].    Pertinent History: CVA How long can you sit comfortably?: no problem How long can you stand comfortably?: only for short periods less than 5 minutes How long can you walk comfortably?: Only for short distances with a rolling walker.   Patient Stated Goals: Pt and sister want the swelling in the pt thighs to decrease to allow pt to be able to walk more and assist in her ADL's to a greater extent . Pain Assessment Currently in Pain?: No/denies    Balance Screening  no falls  Cognition/Observation Observation/Other Assessments Observations: Pt has significant induration in B thigh    Assessment  Cicumferential  measurements  Patella     RT 81cm        Lt  84cm 2" above  Rt  93cm        Lt  95.4cm 4" above  Rt  92.5cm     Lt  95.8cm  6" above  Rt 93.8cm       Lt 91.4cm    Exercise/Treatments     Seated Long Arc Quad: 5 reps Other Seated Knee Exercises: ankle pumps, quad sets, abdominal sets, glut sets x 5  Physical Therapy Assessment and Plan PT Assessment and Plan Clinical Impression Statement:  Pt and family are very distraught over the fact that pt was ambulating with a cane and now due to the swelling in her LE she is barely able to walk at all.  Family states that care for pt has increased to such a degree they are unsue if they can continute.  Pt and family educated in lymphedma and how lymphedma normally presents; (and how pt is not presenting as a normal lymphedma pt;  please note that therapist feels pt should have a through medical exam by MD as swelling proximal and not distal has been linked to cancer although pt has no hx of cancer.)  Pt will be seen for two week period to see if manual techiaques assist in decongesion of LE. Marland Kitchen   Pt will benefit from skilled therapeutic  intervention in order to improve on the following deficits: Increased edema;Decreased strength;Difficulty walking Rehab Potential: Fair PT Frequency: Min 3X/week PT Duration: 4 weeks PT Treatment/Interventions: Therapeutic exercise;Manual techniques PT Plan: Begin decongestive techniques to see if they have any benefit for pt.    Goals Home Exercise Program Pt/caregiver will Perform Home Exercise Program:  (decreased volume of fluid) PT Short Term Goals Time to Complete Short Term Goals: 2 weeks PT Short Term Goal 1: Pt to be able to verbalize the importance of compression as a lifetime necessity to control edema PT Short Term Goal 2: Pt to have obtained  Capri biking shorts PT Short Term Goal 3: Pt volume to have decreased  by 20 % using cone formula  PT Long Term Goals Time to Complete Long Term Goals: 4  weeks PT Long Term Goal 1: Pt to be able to verbalize the importance of skin care PT Long Term Goal 2: Pt volume to be decreased by 50% Long Term Goal 3: Pt to be walking for at least 10 minutes at a time for better health habits.  Problem List Patient Active Problem List   Diagnosis Date Noted  . Lymphedema of both lower extremities 12/17/2013  . Difficulty in walking(719.7) 12/17/2013    PT Plan of Care PT Home Exercise Plan: given   GP Functional Assessment Tool Used: clinical judgement Functional Limitation: Other PT primary Other PT Primary Current Status (B2841(G8990): At least 80 percent but less than 100 percent impaired, limited or restricted Other PT Primary Goal Status (L2440(G8991): At least 60 percent but less than 80 percent impaired, limited or restricted  Bella KennedyCynthia J Jahyra Sukup 12/17/2013, 11:50 AM  Physician Documentation Your signature is required to indicate approval of the treatment plan as stated above.  Please sign and either send electronically or make a copy of this report for your files and return this physician signed original.   Please mark one 1.__approve of plan  2. ___approve of plan with the following conditions.   ______________________________                                                          _____________________ Physician Signature                                                                                                             Date

## 2013-12-17 NOTE — Evaluation (Deleted)
Physical Therapy Evaluation  Patient Details  Name: Laurie Hunter MRN: [id number] Date of Birth: 1945/11/13  Today's Date: 12/17/2013 Time: 1015-1055 PT Time Calculation (min): 40 min Charge:  evaluation             Visit#: 1 of 13  Re-eval: 01/16/14 Assessment Diagnosis: B Edema  Authorization: [employer]BS medicare    Authorization Time Period:    Authorization Visit#: 1 of 10   Past Medical History:  Past Medical History  Diagnosis Date  . Hypertension   . Diabetes mellitus without complication   . Stroke    Past Surgical History:  Past Surgical History  Procedure Laterality Date  . Gallbladder surgery    . Abdominal hysterectomy      Subjective Symptoms/Limitations Symptoms: Laurie Hunter states that she lives at home with her sister and brother.  She states she had a stroke in 2011 causing her to have decreased dorsiflexion and needing to wear a brace on her left LE.  She states  she gained weight but she has lost a significant amount of weight in the past year but her thighs remain very swollen.  She has been referred to PT for lymphedema however true lymphedma the swelling will be the greatest distally; Laurie. Hunter edema is in her thighs but does not involve her feet or calves .  She states her legs are big in the morning and decrease slightly when she walks.  Her primary MD is Dr. Jerelyn [facility] with [facility].    Pertinent History: CVA How long can you sit comfortably?: no problem How long can you stand comfortably?: only for short periods less than 5 minutes How long can you walk comfortably?: Only for short distances with a rolling walker.   Patient Stated Goals: Pt and sister want the swelling in the pt thighs to decrease to allow pt to be able to walk more and assist in her ADL's to a greater extent . Pain Assessment Currently in Pain?: No/denies     Balance Screening  no falls     Cognition/Observation Observation/Other Assessments Observations: Pt has significant  induration in B thigh   Assessment  patella circumferential measurement:    RT  81cm       Lt 84cm  2" above                                                  Rt    93cm       Lt 95.4cm  4" above                                                  Rt    92.5 cm   Lt 95.8cm   6" above                                                  Rt    93.8 cm   Lt 91.4 cm  Exercise/Treatments  Given for ankle pumps, quad set; long arc quad, isometric for gluts and abdominal      Physical  Therapy Assessment and Plan PT Assessment and Plan Clinical Impression Statement:  Pt and family are very distraught over the fact that pt was ambulating with a cane and now due to the swelling in her LE she is barely able to walk at all.  Family states that care for pt has increased to such a degree they are unsue if they can continute.  Pt and family educated in lymphedma and how lymphedma normally presents; (and how pt is not presenting as a normal lymphedma pt;  please note that therapist feels pt should have a through medical exam by MD as swelling proximal and not distal has been linked to cancer although pt has no hx of cancer.)  Pt will be seen for two week period to see if manual techiaques assist in decongesion of LE. Marland Kitchen.   Pt will benefit from skilled therapeutic intervention in order to improve on the following deficits: Increased edema;Decreased strength;Difficulty walking Rehab Potential: Fair PT Frequency: Min 3X/week PT Duration: 4 weeks PT Treatment/Interventions: Therapeutic exercise;Manual techniques PT Plan: Begin decongestive techniques to see if they have any benefit for pt.    Goals Home Exercise Program Pt/caregiver will Perform Home Exercise Program:  (decreased volume of fluid) PT Short Term Goals Time to Complete Short Term Goals: 2 weeks PT Short Term Goal 1: Pt to be able to verbalize the importance of compression as a lifetime necessity to control edema PT Short Term Goal 2: Pt to have  obtained  Capri biking shorts PT Short Term Goal 3: Pt volume to have decreased  by 20 % using cone formula  PT Long Term Goals Time to Complete Long Term Goals: 4 weeks PT Long Term Goal 1: Pt to be able to verbalize the importance of skin care PT Long Term Goal 2: Pt volume to be decreased by 50%  Problem List Patient Active Problem List   Diagnosis Date Noted  . Lymphedema of both lower extremities 12/17/2013  . Difficulty in walking(719.7) 12/17/2013    PT Plan of Care PT Home Exercise Plan: given   GP Functional Assessment Tool Used: clinical judgement Functional Limitation: Other PT primary Other PT Primary Current Status (Z6109(G8990): At least 80 percent but less than 100 percent impaired, limited or restricted Other PT Primary Goal Status (U0454(G8991): At least 60 percent but less than 80 percent impaired, limited or restricted  Laurie KennedyCynthia J Koda Hunter 12/17/2013, 11:43 AM  Physician Documentation Your signature is required to indicate approval of the treatment plan as stated above.  Please sign and either send electronically or make a copy of this report for your files and return this physician signed original.   Please mark one 1.__approve of plan  2. ___approve of plan with the following conditions.   ______________________________                                                          _____________________ Physician Signature  Date  

## 2013-12-22 ENCOUNTER — Ambulatory Visit (HOSPITAL_COMMUNITY)
Admission: RE | Admit: 2013-12-22 | Discharge: 2013-12-22 | Disposition: A | Discharge status: Home / Self Care | Payer: Medicare Other | Source: Ambulatory Visit | Attending: Internal Medicine | Admitting: Internal Medicine

## 2013-12-22 NOTE — Progress Notes (Signed)
out Physical Therapy Treatment Patient Details  Name: Laurie Hunter MRN: [id number] Date of Birth: 11/03/1945  Today's Date: 12/22/2013 Time: 1145-1225 PT Time Calculation (min): 40 min Charge:  Manual [id number] Visit#: 2 of 12  Re-eval: 01/16/14    Authorization: [employer] medicare     Authorization Visit#: 2 of 10   Subjective: Symptoms/Limitations Symptoms: Pt states she has been trying to walk more and has been doing the exercises given to her.  She states she feels better overall     Exercise/Treatments      Manual Therapy Manual Therapy: Edema management Edema Management: Pt recived manual decongestive technique for B LE including supraclavicular, deep and superfical abdominal, and routing fluid from Rt inguinal to Rt axillary anastomisis; Lt inguinal to Lt axillary anastomosis.   Physical Therapy Assessment and Plan PT Assessment and Plan Clinical Impression Statement: Pt has noted increased induration along posterior thigh but unable to rolll to stomach and table was to narrow for pt to feel comfortable rolling to side therefore therapist placed bolster under ankle to be able to complete manual to posterior aspect of thigh.   PT Plan: continue with manual decongestive techniques.         Problem List Patient Active Problem List   Diagnosis Date Noted  . Lymphedema of both lower extremities 12/17/2013  . Difficulty in walking(719.7) 12/17/2013       GP    Bella Kennedy 12/22/2013, 5:36 PM

## 2013-12-23 ENCOUNTER — Ambulatory Visit (HOSPITAL_COMMUNITY)
Admission: RE | Admit: 2013-12-23 | Discharge: 2013-12-23 | Disposition: A | Discharge status: Home / Self Care | Payer: Medicare Other | Source: Ambulatory Visit | Attending: Internal Medicine | Admitting: Internal Medicine

## 2013-12-23 DIAGNOSIS — R262 Difficulty in walking, not elsewhere classified: Secondary | ICD-10-CM

## 2013-12-23 DIAGNOSIS — I890 Lymphedema, not elsewhere classified: Secondary | ICD-10-CM

## 2013-12-23 NOTE — Progress Notes (Signed)
Physical Therapy Treatment Patient Details  Name: Laurie Hunter MRN: [id number] Date of Birth: 19-Jun-1946  Today's Date: 12/23/2013 Time: 2035-5974 PT Time Calculation (min): 48 min Charge:  Manual [id number] Visit#: 3 of 12  Re-eval: 01/16/14    Authorization: [employer] medicare  Authorization Time Period:    Authorization Visit#: 3 of 10   Subjective: Symptoms/Limitations Symptoms: Laurie Hunter states that she has noted increased voiding after manual decongestion.   Exercise/Treatments    Manual Therapy Manual Therapy: Edema management Edema Management: Pt recieved manual decongestive techniques for B LE routing using inguinal t-axillary anastomosis.  Pt was unable to roll to side therefore pt foot was placed on bolster to give therapist acess to posterior aspect of thigh.    Physical Therapy Assessment and Plan PT Assessment and Plan Clinical Impression Statement: therapist concentrated on posterior thigh B due to increased induration. Noted softening of tissue after manual techniques.  Therapise asked about biker capri shorts and if pt had aquired any; pt states she was confused and thought that the therapist wanted her to purchase capri pants not biker capri.  PT Plan: continue with manual decongestive techniques.     Goals  progressing  Problem List Patient Active Problem List   Diagnosis Date Noted  . Lymphedema of both lower extremities 12/17/2013  . Difficulty in walking(719.7) 12/17/2013       GP    RUSSELL,CINDY 12/23/2013, 1:45 PM

## 2013-12-28 ENCOUNTER — Ambulatory Visit (HOSPITAL_COMMUNITY)
Admission: RE | Admit: 2013-12-28 | Discharge: 2013-12-28 | Disposition: A | Discharge status: Home / Self Care | Payer: Medicare Other | Source: Ambulatory Visit | Attending: Internal Medicine | Admitting: Internal Medicine

## 2013-12-28 DIAGNOSIS — I890 Lymphedema, not elsewhere classified: Secondary | ICD-10-CM

## 2013-12-28 DIAGNOSIS — R262 Difficulty in walking, not elsewhere classified: Secondary | ICD-10-CM

## 2013-12-28 NOTE — Progress Notes (Signed)
Physical Therapy Treatment Patient Details  Name: Laurie Hunter MRN: [id number] Date of Birth: 08/31/1945  Today's Date: 12/28/2013 Time: 4098-11911430-1515 PT Time Calculation (min): 45 min Charge:  Manual [id number] Visit#: 4 of 12  Re-eval: 01/16/14    Authorization: [employer] medicare   Authorization Visit#: 4 of 10   Subjective: Symptoms/Limitations Symptoms: Pt came to department with biker shorts with 10% spandex in a bag to make sure this is what therapist wanted.  Explained that it was a good start but therapist would like to see 20% spandex.  Pt states she continues to walk more     Exercise/Treatments      Manual Therapy Edema Management: Pt recieved manual decongestive techniques for B LE routing using inguinal t-axillary anastomosis.  Pt was unable to roll to side therefore pt foot was placed on bolster to give therapist acess to posterior aspect of thigh.    Physical Therapy Assessment and Plan PT Assessment and Plan Clinical Impression Statement: Pt induration is decreased.  Pt states she is noticing that she is having to void more.  PT Plan: continue with manual decongestive techniques remeasure next treatment.    Goals    Problem List Patient Active Problem List   Diagnosis Date Noted  . Lymphedema of both lower extremities 12/17/2013  . Difficulty in walking(719.7) 12/17/2013       GP    RUSSELL,CINDY 12/28/2013, 5:18 PM

## 2013-12-30 ENCOUNTER — Ambulatory Visit (HOSPITAL_COMMUNITY)
Admission: RE | Admit: 2013-12-30 | Discharge: 2013-12-30 | Disposition: A | Discharge status: Home / Self Care | Payer: Medicare Other | Source: Ambulatory Visit | Attending: Internal Medicine | Admitting: Internal Medicine

## 2013-12-30 DIAGNOSIS — R262 Difficulty in walking, not elsewhere classified: Secondary | ICD-10-CM

## 2013-12-30 DIAGNOSIS — I890 Lymphedema, not elsewhere classified: Secondary | ICD-10-CM

## 2013-12-30 NOTE — Progress Notes (Signed)
Physical Therapy Treatment Patient Details  Name: Laurie Hunter MRN: [id number] Date of Birth: Feb 23, 1946  Today's Date: 12/30/2013 Time: 1440-1520 PT Time Calculation (min): 40 min Charge:  Manual 1440-1520  Visit#: 5 of 12  Re-eval: 01/16/14    Authorization: [employer] medicare  Authorization Visit#: 5 of 10   Subjective: Symptoms/Limitations Symptoms: Pt states lifting leg and walking are all getting easier for her to do.  She states that she is ordering biker shorts with 20% spandex on line.    Exercise/Treatments     Manual Therapy Edema Management: Pt recieved manual decongestive techniques for B LE including supraclavicular, deep and superfical abdominal then routing fluid from Rt inguinal and Lt inguinal areas using the inguinal/axillary anastomosiis.   Physical Therapy Assessment and Plan PT Assessment and Plan Clinical Impression Statement: Pt LE decreasing is size   2" above  Rt   was 93cm  now 89.2 cm ;  Lt  was 95.4cm now 90.7cm; 4" above  Rt   was 92.5cm now 91.5 cm      Lt  95.8cm  now 94 cm PT Plan: continue with manual decongestive techniques     Goals  progressing  Problem List Patient Active Problem List   Diagnosis Date Noted  . Lymphedema of both lower extremities 12/17/2013  . Difficulty in walking(719.7) 12/17/2013       GP    RUSSELL,CINDY 12/30/2013, 3:47 PM

## 2014-01-01 ENCOUNTER — Ambulatory Visit (HOSPITAL_COMMUNITY)
Admission: RE | Admit: 2014-01-01 | Discharge: 2014-01-01 | Disposition: A | Discharge status: Home / Self Care | Payer: Medicare Other | Source: Ambulatory Visit | Attending: Internal Medicine | Admitting: Internal Medicine

## 2014-01-01 DIAGNOSIS — I890 Lymphedema, not elsewhere classified: Secondary | ICD-10-CM

## 2014-01-01 DIAGNOSIS — R262 Difficulty in walking, not elsewhere classified: Secondary | ICD-10-CM

## 2014-01-01 NOTE — Progress Notes (Signed)
  Physical Therapy Treatment Patient Details  Name: Laurie Hunter MRN: [id number] Date of Birth: Dec 06, 1945  Today's Date: 01/01/2014 Time: 4098-11911438-1517 PT Time Calculation (min): 39 min Charge:  Manual [id number] Visit#: 6 of 12  Re-eval: 01/16/14    Authorization: [employer] medicare     Authorization Visit#: 6 of 10   Subjective: Symptoms/Limitations Symptoms: Pt states she feels as if the treatment is helping  Exercise/Treatments    Manual Therapy Edema Management:  Pt recieved manual decongestive techniques for B LE including supraclavicular, deep and superfical abdominal then routing fluid from Rt inguinal and Lt inguinal areas using the inguinal/axillary anastomosiis.   Pt unable to lie on either side so posterior aspect of B LE was completed by placing pt foot on a bolster .  Physical Therapy Assessment and Plan PT Assessment and Plan Clinical Impression Statement: Pt continues to have induration on the posterior aspect of her thighs. Therapist attempted to target this area but due to postiional challenges this is difficult.     Problem List Patient Active Problem List   Diagnosis Date Noted  . Lymphedema of both lower extremities 12/17/2013  . Difficulty in walking(719.7) 12/17/2013       GP    Sani Loiseau,CINDY 01/01/2014, 4:43 PM

## 2014-01-05 ENCOUNTER — Ambulatory Visit (HOSPITAL_COMMUNITY)
Admission: RE | Admit: 2014-01-05 | Discharge: 2014-01-05 | Disposition: A | Discharge status: Home / Self Care | Payer: Medicare Other | Source: Ambulatory Visit | Attending: Internal Medicine | Admitting: Internal Medicine

## 2014-01-05 DIAGNOSIS — I890 Lymphedema, not elsewhere classified: Secondary | ICD-10-CM

## 2014-01-05 DIAGNOSIS — R262 Difficulty in walking, not elsewhere classified: Secondary | ICD-10-CM

## 2014-01-05 NOTE — Progress Notes (Signed)
Physical Therapy Treatment Patient Details  Name: Laurie Hunter MRN: [id number] Date of Birth: 22-Jun-1946  Today's Date: 01/05/2014 Time: 1017-1059 PT Time Calculation (min): 42 min Charge:  Manual [id number] Visit#: 7 of 12  Re-eval: 01/14/14   Subjective: Symptoms/Limitations Symptoms: Pt states she was able to assist her brother in getting her legs into the bed; state usually he has to complete all the lifting.  States biker capri shorts are on order   Exercise/Treatments     Seated Long Arc Quad: 5 reps Supine Straight Leg Raises: 5 reps Other Supine Knee Exercises: hip abduction x 5   Pt recieved manual decongestive techniques for B LE including supraclavicular, deep and superfical abdominal then routing fluid from Rt inguinal and Lt inguinal areas using the inguinal/axillary anastomosiis. Pt unable to lie on either side so posterior aspect of B LE was completed by placing pt foot on a bolster .     Physical Therapy Assessment and Plan PT Assessment and Plan Clinical Impression Statement: Pt able to move her leg with increased ease; states pants seem looser in the thighs  PT Plan: continue with manual decongestive techniques     Goals  progressing  Problem List Patient Active Problem List   Diagnosis Date Noted  . Lymphedema of both lower extremities 12/17/2013  . Difficulty in walking(719.7) 12/17/2013       GP    RUSSELL,CINDY 01/05/2014, 11:04 AM

## 2014-01-06 ENCOUNTER — Ambulatory Visit (HOSPITAL_COMMUNITY)
Admission: RE | Admit: 2014-01-06 | Discharge: 2014-01-06 | Disposition: A | Discharge status: Home / Self Care | Payer: Medicare Other | Source: Ambulatory Visit | Attending: Internal Medicine | Admitting: Internal Medicine

## 2014-01-06 DIAGNOSIS — I890 Lymphedema, not elsewhere classified: Secondary | ICD-10-CM

## 2014-01-06 DIAGNOSIS — R262 Difficulty in walking, not elsewhere classified: Secondary | ICD-10-CM

## 2014-01-06 NOTE — Progress Notes (Signed)
Physical Therapy Treatment Patient Details  Name: Laurie Hunter MRN: [id number] Date of Birth: Jun 27, 1946  Today's Date: 01/06/2014 Time: 1355-1436 PT Time Calculation (min): 41 min Manual 1355-1432; there ex [id number] Visit#: 8 of 12  Re-eval: 01/14/14    Authorization: [employer] medicare   Authorization Visit#: 8 of 10   Subjective: Symptoms/Limitations Symptoms: Pt states she has been doing her exercises .  Pt comes in today with a different pair of capri biker shorts again this has 9% spandex.   Explained that this is better than nothing but therapist would like to see 15-20% spandex   Exercise/Treatments      Standing Other Standing Knee Exercises: pulling up into upright position x 5 Supine Heel Slides: Both;10 reps Straight Leg Raises: 10 reps Other Supine Knee Exercises: 10  Manual Therapy Edema Management: Pt recieved manual decongestive techniques for B LE including supraclavicular,deep and superficial abdominal followed by routing of fluid from inguinal to axillary using on the Rt and Lt sides.  Pt is unable to tolerate sidelying or prone therefore posterior aspect of thigh was completed by therapist placing pt foot on a bolster.   Physical Therapy Assessment and Plan PT Assessment and Plan Clinical Impression Statement: Pt able to lift leg with greater ease today assisting therapist with going from sit to supine PT Plan: remeasure LE next treatmentl     Goals  progressing   Problem List Patient Active Problem List   Diagnosis Date Noted  . Lymphedema of both lower extremities 12/17/2013  . Difficulty in walking(719.7) 12/17/2013       GP    RUSSELL,CINDY 01/06/2014, 3:20 PM

## 2014-01-08 ENCOUNTER — Ambulatory Visit (HOSPITAL_COMMUNITY)
Admission: RE | Admit: 2014-01-08 | Discharge: 2014-01-08 | Disposition: A | Discharge status: Home / Self Care | Payer: Medicare Other | Source: Ambulatory Visit | Attending: Internal Medicine | Admitting: Internal Medicine

## 2014-01-08 DIAGNOSIS — R262 Difficulty in walking, not elsewhere classified: Secondary | ICD-10-CM

## 2014-01-08 DIAGNOSIS — I890 Lymphedema, not elsewhere classified: Secondary | ICD-10-CM

## 2014-01-08 NOTE — Progress Notes (Signed)
Physical Therapy Treatment Patient Details  Name: Laurie Hunter MRN: [id number] Date of Birth: 10/03/1945  Today's Date: 01/08/2014 Time: 4098-11911350-1438 PT Time Calculation (min): 48 min Charge: manual [id number] Visit#: 9 of 12  Re-eval: 01/14/14    Authorization: [employer] medicare  Authorization Time Period:    Authorization Visit#: 9 of 10   Subjective: Symptoms/Limitations Symptoms: PT states she feels treatment is beneficial   Precautions/Restrictions     Exercise/Treatments 2" superior to patella 6/17  Rt=89.2 Lt =90.7 Current Rt=88.0 Lt=91;  4" superior to patella 6/17 Rt 91.5  Lt 94.0 Current Rt 89.5  Lt 94.8      Standing Other Standing Knee Exercises: pulling up into upright position x 5    Supine Heel Slides: Both;10 reps Bridges: 10 reps Straight Leg Raises: 10 reps Other Supine Knee Exercises: 10  Manual Therapy Edema Management: Pt recieved manual decongestive techniques for B LE including supraclavicular,deep and superficial abdominal followed by routing of fluid from inguinal to axillary using on the Rt and Lt sides.  Pt is unable to tolerate sidelying or prone therefore posterior aspect of thigh was completed by therapist placing pt foot on a bolster.   Physical Therapy Assessment and Plan PT Assessment and Plan Clinical Impression Statement: Pt continues to decongest with greater reduction since 12/30/2013  PT Plan: continue treatment    Goals    Problem List Patient Active Problem List   Diagnosis Date Noted  . Lymphedema of both lower extremities 12/17/2013  . Difficulty in walking(719.7) 12/17/2013       GP    RUSSELL,CINDY 01/08/2014, 5:08 PM

## 2014-01-11 ENCOUNTER — Ambulatory Visit (HOSPITAL_COMMUNITY)
Admission: RE | Admit: 2014-01-11 | Discharge: 2014-01-11 | Disposition: A | Discharge status: Home / Self Care | Payer: Medicare Other | Source: Ambulatory Visit | Attending: Internal Medicine | Admitting: Internal Medicine

## 2014-01-11 NOTE — Progress Notes (Signed)
Physical Therapy Treatment; Gcode update Patient Details  Name: Laurie Hunter MRN: [id number] Date of Birth: [birth year]  Today's Date: 01/11/2014 Time: [id number] PT Time Calculation (min): 45 min  Visit#: 10 of 12  Re-eval: 01/14/14 Authorization: [employer] medicare  Authorization Visit#: 10 of 20  Charges:  Manual 45  Subjective: Symptoms/Limitations Symptoms: Pt states she ordered biker shorts but they are cotton and not compressive.  Discussed the need for heavier compression than standard biker shorts with patient and sister.   Pain Assessment Currently in Pain?: No/denies   Exercise/Treatments Supine Heel Slides: Both;10 reps;AAROM Bridges: AAROM;10 reps Straight Leg Raises: AAROM;Both;10 reps Other Supine Knee Exercises: hip abduction bilaterally 10 reps Sit to stand 5 reps from normal height EOB       Manual Therapy Manual Therapy: Edema management Edema Management: Pt received manual decongestive techniques for B LE including supraclavicular,deep and superficial abdominal followed by routing of fluid from inguinal to axillary using on the Rt and Lt sides. Pt is unable to tolerate sidelying or prone therefore posterior aspect of thigh was completed by therapist placing pt foot on a bolster  Physical Therapy Assessment and Plan PT Assessment and Plan Clinical Impression Statement: Pt with improving function and mobility.  Pt with less induration noted today as described in initial evaluation. PT Plan: Continue manual lymph massage.  Order faxed to MD for compression garments.  Contact [facility] for consultation on garments when order received.  Re-evaluate next visit.      Problem List Patient Active Problem List   Diagnosis Date Noted  . Lymphedema of both lower extremities 12/17/2013  . Difficulty in walking(719.7) 12/17/2013       GP Functional Assessment Tool Used: clinical judgement Functional Limitation: Other PT primary Other PT Primary Current Status  (Q2595(G8990): At least 80 percent but less than 100 percent impaired, limited or restricted Other PT Primary Goal Status (G3875(G8991): At least 60 percent but less than 80 percent impaired, limited or restricted  Lurena NidaAmy B Frazier, PTA/CLT 01/11/2014, 12:42 PM

## 2014-01-13 ENCOUNTER — Ambulatory Visit (HOSPITAL_COMMUNITY)
Admission: RE | Admit: 2014-01-13 | Discharge: 2014-01-13 | Disposition: A | Discharge status: Home / Self Care | Payer: Medicare Other | Source: Ambulatory Visit | Attending: Internal Medicine | Admitting: Internal Medicine

## 2014-01-13 DIAGNOSIS — IMO0001 Reserved for inherently not codable concepts without codable children: Secondary | ICD-10-CM | POA: Diagnosis not present

## 2014-01-13 DIAGNOSIS — R262 Difficulty in walking, not elsewhere classified: Secondary | ICD-10-CM

## 2014-01-13 DIAGNOSIS — I890 Lymphedema, not elsewhere classified: Secondary | ICD-10-CM

## 2014-01-13 NOTE — Evaluation (Signed)
Physical Therapy Reassessment  Patient Details  Name: Laurie Hunter MRN: [id number] Date of Birth: [birth year]  Today's Date: 01/13/2014 Time: 0814-4818 PT Time Calculation (min): 40 min Charge:  Manual 1105-1136; there ex 5631-4970             Visit#: 11 of 21  Re-eval: 02/12/14    Authorization: [employer] medicare        Authorization Visit#: 47 of 20   Past Medical History:  Past Medical History  Diagnosis Date  . Hypertension   . Diabetes mellitus without complication   . Stroke    Past Surgical History:  Past Surgical History  Procedure Laterality Date  . Gallbladder surgery    . Abdominal hysterectomy      Subjective Symptoms/Limitations Symptoms: Pt has no complaints states that walking is getting easier for her     Assessment   Patella  RT 79 cm   was 81cm      Lt 71    cm    was   84 cm  2" above Rt  89 cm   was 93cm      Lt  87.3 cm  was   95.4 cm  4" above Rt  88.5 cm was 92.5cm  Lt 91.4 cm   was  95.8 cm  6" above Rt  90.0 cm was  93.8cm Lt 91.4 cm    Was 91.4 cm  Pt initially coming into therapy in a wheelchair she now comes in a walker.  Exercise/Treatments     Heel Slides: 15 reps Bridges: 15 reps Straight Leg Raises: 15 reps Other Supine Knee Exercises: hip ab/adduction x 15   Manual Therapy Edema Management: Pt recieved manual decongestive techniques for B LE routing fluid using the inguinal/axillary anastomosis.  Posterior thigh was completed by placing pt LE on a bolster.   Physical Therapy Assessment and Plan PT Assessment and Plan Clinical Impression Statement: Pt continues to have decreased induration with improved mobility. PT Plan: continue with manual lymph decongestion.  Emphasis to pt that we want her to be able to walk for ten minutes without stopping and for her to work towards this goal at home.   See pt 3x week for 4 more weeks. Goals PT Short Term Goals Time to Complete Short Term Goals: 2 weeks PT Short Term Goal 1: Pt to be  able to verbalize the importance of compression as a lifetime necessity to control edema PT Short Term Goal 1 - Progress: Met PT Short Term Goal 2: Pt to have obtained  Capri biking shorts PT Short Term Goal 2 - Progress: Progressing toward goal PT Short Term Goal 3: Pt volume to have decreased  by 20 % using cone formula  PT Short Term Goal 3 - Progress: Progressing toward goal PT Long Term Goals Time to Complete Long Term Goals: 4 weeks PT Long Term Goal 1: Pt to be able to verbalize the importance of skin care PT Long Term Goal 1 - Progress: Met PT Long Term Goal 2: Pt volume to be decreased by 50% PT Long Term Goal 2 - Progress: Progressing toward goal Long Term Goal 3: Pt to be walking for at least 10 minutes at a time for better health habits. Long Term Goal 3 Progress: Progressing toward goal  Problem List Patient Active Problem List   Diagnosis Date Noted  . Lymphedema of both lower extremities 12/17/2013  . Difficulty in walking(719.7) 12/17/2013       GP  RUSSELL,CINDY 01/13/2014, 5:10 PM  Physician Documentation Your signature is required to indicate approval of the treatment plan as stated above.  Please sign and either send electronically or make a copy of this report for your files and return this physician signed original.   Please mark one 1.__approve of plan  2. ___approve of plan with the following conditions.   ______________________________                                                          _____________________ Physician Signature                                                                                                             Date

## 2014-01-19 ENCOUNTER — Inpatient Hospital Stay (HOSPITAL_COMMUNITY): Admission: RE | Admit: 2014-01-19 | Payer: Medicare Other | Source: Ambulatory Visit | Admitting: Physical Therapy

## 2014-01-20 ENCOUNTER — Ambulatory Visit (HOSPITAL_COMMUNITY)
Admission: RE | Admit: 2014-01-20 | Discharge: 2014-01-20 | Disposition: A | Discharge status: Home / Self Care | Payer: Medicare Other | Source: Ambulatory Visit | Attending: Internal Medicine | Admitting: Internal Medicine

## 2014-01-20 DIAGNOSIS — IMO0001 Reserved for inherently not codable concepts without codable children: Secondary | ICD-10-CM | POA: Diagnosis not present

## 2014-01-20 NOTE — Progress Notes (Signed)
Physical Therapy Treatment Patient Details  Name: Laurie Hunter MRN: [id number] Date of Birth: 11-09-1945  Today's Date: 01/20/2014 Time: 1352-1430 PT Time Calculation (min): 38 min  Visit#: 12 of 21  Re-eval: 02/12/14  Authorization: [employer] medicare  Authorization Visit#: 12 of 20  Charges:  Manual 38  Subjective: Symptoms/Limitations Symptoms: Pt states she can tell she's getting better.  STates she can move her legs better and is stronger.  Pain Assessment Currently in Pain?: No/denies   Objective: Manual Therapy Manual Therapy: Edema management Edema Management: Pt recieved manual decongestive techniques for B LE routing fluid using the inguinal/axillary anastomosis. Posterior thigh was completed by placing pt LE on a bolster  Physical Therapy Assessment and Plan PT Assessment and Plan Clinical Impression Statement: Noted reduction in induration bilaterally.  [facility] and [facility]  Pt with improved  bed mobility as well.   PT Plan: continue with manual lymph decongestion.    Problem List Patient Active Problem List   Diagnosis Date Noted  . Lymphedema of both lower extremities 12/17/2013  . Difficulty in walking(719.7) 12/17/2013     Lurena NidaAmy B Ajene Carchi, PTA/CLT 01/20/2014, 5:01 PM

## 2014-01-22 ENCOUNTER — Ambulatory Visit (HOSPITAL_COMMUNITY)
Admission: RE | Admit: 2014-01-22 | Discharge: 2014-01-22 | Disposition: A | Discharge status: Home / Self Care | Payer: Medicare Other | Source: Ambulatory Visit | Attending: Internal Medicine | Admitting: Internal Medicine

## 2014-01-22 DIAGNOSIS — IMO0001 Reserved for inherently not codable concepts without codable children: Secondary | ICD-10-CM | POA: Diagnosis not present

## 2014-01-22 NOTE — Progress Notes (Signed)
Physical Therapy Treatment Patient Details  Name: Rusty AusGloria D Jacquin MRN: [id number] Date of Birth: 18-Nov-1945  Today's Date: 01/22/2014 Time: 1018-1056 PT Time Calculation (min): 38 min  Visit#: 13 of 21  Re-eval: 02/12/14 Authorization: [employer] medicare  Authorization Visit#: 13 of 20  Charges:  Manual 38  Subjective: Symptoms/Limitations Symptoms: Pt states she is moving faster than she was before.  Trying to eat better also. Pain Assessment Currently in Pain?: No/denies   Objective: Manual Therapy Manual Therapy: Edema management Edema Management: Pt received manual decongestive techniques for B LE routing fluid using bilateral inguina-axillo pathways. Posterior thigh was completed by placing pt LE on a therapist shoulder.   Physical Therapy Assessment and Plan PT Assessment and Plan Clinical Impression Statement: Continued improvements and favorable response to manual lymph drainage.  Anticipate further improvement with proper compression. PT Plan: continue with manual lymph decongestion. [facility]    Goals PT Short Term Goals Time to Complete Short Term Goals: 2 weeks PT Short Term Goal 1: Pt to be able to verbalize the importance of compression as a lifetime necessity to control edema PT Short Term Goal 2: Pt to have obtained  Capri biking shorts PT Short Term Goal 3: Pt volume to have decreased  by 20 % using cone formula  PT Long Term Goals Time to Complete Long Term Goals: 4 weeks PT Long Term Goal 1: Pt to be able to verbalize the importance of skin care PT Long Term Goal 2: Pt volume to be decreased by 50% Long Term Goal 3: Pt to be walking for at least 10 minutes at a time for better health habits.  Problem List Patient Active Problem List   Diagnosis Date Noted  . Lymphedema of both lower extremities 12/17/2013  . Difficulty in walking(719.7) 12/17/2013       Lurena NidaAmy B Frazier, PTA/CLT 01/22/2014, 11:25 AM

## 2014-01-25 ENCOUNTER — Ambulatory Visit (HOSPITAL_COMMUNITY)
Admission: RE | Admit: 2014-01-25 | Discharge: 2014-01-25 | Disposition: A | Discharge status: Home / Self Care | Payer: Medicare Other | Source: Ambulatory Visit | Attending: Physical Therapy | Admitting: Physical Therapy

## 2014-01-25 DIAGNOSIS — R262 Difficulty in walking, not elsewhere classified: Secondary | ICD-10-CM

## 2014-01-25 DIAGNOSIS — IMO0001 Reserved for inherently not codable concepts without codable children: Secondary | ICD-10-CM | POA: Diagnosis not present

## 2014-01-25 DIAGNOSIS — I890 Lymphedema, not elsewhere classified: Secondary | ICD-10-CM

## 2014-01-25 NOTE — Progress Notes (Signed)
Physical Therapy Treatment Patient Details  Name: Rusty AusGloria D Brothers MRN: [id number] Date of Birth: 07-Aug-1945  Today's Date: 01/25/2014 Time: 0900-0940 PT Time Calculation (min): 40 min Charge:  Manual 0900-0930; there ex 930-940 Visit#: 14 of 21  Re-eval: 02/12/14    Authorization: BCBS medicare  Authorization Time Period:    Authorization Visit#: 14 of 20   Subjective: Symptoms/Limitations Symptoms: Pt states that she can tell her rt leg has decreased in size and is easier to move but her Lt continues to bother her.     Exercise/Treatments   Supine Heel Slides: 15 reps Bridges: 15 reps Straight Leg Raises: 15 reps Other Supine Knee Exercises: hip ab/adduction x 15  Manual Therapy Edema Management: Pt recieved manual decongestive techniques for B LE routing fluid using supraclavicular, superficial and deep abdominal as well as anterior and posterior inguinal-axillary anastomosis.  Posterior aspect completed with pt foot placed on bolster.  Began manual with Lt LE first to be able to spend more time decongesting Lt LE especially posterior aspect   Physical Therapy Assessment and Plan PT Assessment and Plan Clinical Impression Statement: Pt continues to show improvement.  Pt was able to come supine to sit today with Mod I.  This took verbal encouragement from therapist but therapist did not assist pt in any way. PT Plan: continue with manual lymph decongestion. [facility]  Begin trying to have pt go from sit to supine I     Goals  progressing   Problem List Patient Active Problem List   Diagnosis Date Noted  . Lymphedema of both lower extremities 12/17/2013  . Difficulty in walking(719.7) 12/17/2013       GP    RUSSELL,CINDY 01/25/2014, 12:01 PM

## 2014-01-27 ENCOUNTER — Ambulatory Visit (HOSPITAL_COMMUNITY)
Admission: RE | Admit: 2014-01-27 | Discharge: 2014-01-27 | Disposition: A | Discharge status: Home / Self Care | Payer: Medicare Other | Source: Ambulatory Visit | Attending: *Deleted | Admitting: *Deleted

## 2014-01-27 DIAGNOSIS — IMO0001 Reserved for inherently not codable concepts without codable children: Secondary | ICD-10-CM | POA: Diagnosis not present

## 2014-01-27 NOTE — Progress Notes (Signed)
Physical Therapy Treatment Patient Details  Name: Laurie Hunter MRN: [id number] Date of Birth: 22-Nov-1945  Today's Date: 01/27/2014 Time: 5284-13241350-1428 PT Time Calculation (min): 38 min  Visit#: 15 of 21  Re-eval: 02/12/14 Authorization: BCBS medicare  Authorization Visit#: 15 of 20  Charges:  Manual 38  Subjective: Symptoms/Limitations Symptoms: Pt states she has been working on her exercises and getting in/out bed better.  Pain Assessment Currently in Pain?: No/denies   Objective: Manual Therapy Edema Management: Pt recieved manual decongestive techniques for B LE routing fluid using supraclavicular, superficial and deep abdominal as well as anterior and posterior inguinal-axillary anastomosis. Posterior aspect completed with pt foot placed on bolster. Began manual with Lt LE first to be able to spend more time decongesting Lt LE especially posterior aspect   Physical Therapy Assessment and Plan PT Assessment and Plan Clinical Impression Statement: PT able to bend knees further today with bridging and able to come supine to sit independently, with increased time. Pt continues to require min assist from therapist for LE's when transferring sit to supine.    PT Plan: continue with manual lymph decongestion.  Pt encouraged to check her sisters email for [facility] .      Problem List Patient Active Problem List   Diagnosis Date Noted  . Lymphedema of both lower extremities 12/17/2013  . Difficulty in walking(719.7) 12/17/2013         Laurie Hunter, PTA/CLT 01/27/2014, 4:37 PM

## 2014-01-29 ENCOUNTER — Ambulatory Visit (HOSPITAL_COMMUNITY)
Admission: RE | Admit: 2014-01-29 | Discharge: 2014-01-29 | Disposition: A | Discharge status: Home / Self Care | Payer: Medicare Other | Source: Ambulatory Visit | Attending: *Deleted | Admitting: *Deleted

## 2014-01-29 DIAGNOSIS — R262 Difficulty in walking, not elsewhere classified: Secondary | ICD-10-CM

## 2014-01-29 DIAGNOSIS — I890 Lymphedema, not elsewhere classified: Secondary | ICD-10-CM

## 2014-01-29 DIAGNOSIS — IMO0001 Reserved for inherently not codable concepts without codable children: Secondary | ICD-10-CM | POA: Diagnosis not present

## 2014-01-29 NOTE — Progress Notes (Signed)
Physical Therapy Treatment Patient Details  Name: Laurie Hunter MRN: [id number] Date of Birth: Nov 23, 1945  Today's Date: 01/29/2014 Time: 1300-1350 PT Time Calculation (min): 50 min Charge:   Manual 1300-1335; there ex 1335-1350 Visit#: 16 of 21  Re-eval: 02/12/14    Authorization: [employer] medicare    Authorization Visit#: 16 of 20   Subjective: Symptoms/Limitations Symptoms: Pt states that her MD is on the second floor and she was able to walk into her MD visit without using a wheelchair which she has not done for a long time.  Pt has lost eight pounds since mid April   Exercise/Treatments  Rt patella    76 cm  was  79 cm      Lt 67     cm was  55   cm  2" superior  85.2     was  89 cm      Lt 91.2  cm was  88   cm  4"                87.2     Was 88.5         Lt 91.0   cm was 91.4 cm   6"                89.3     Was 90.0cm    Lt  88.0  cm was 91.4 cm Seated Other Seated Knee Exercises: sit to stand x 10 rep  Supine Heel Slides: 15 reps Bridges: 15 reps Straight Leg Raises: 15 reps Other Supine Knee Exercises: hip ab/adduction x 15   Manual Therapy Edema Management: Pt recieved manual decongestive techniques for B LE routing fluid using supraclavicular, superficial and deep abdominal as well as anterior and posterior inguinal-axillary anastomosis. Posterior aspect completed with pt foot placed on bolster. Began manual with Lt LE first to be able to spend more time decongesting Lt LE especially posterior aspect   Physical Therapy Assessment and Plan PT Assessment and Plan Clinical Impression Statement: Pt continues to decongest.  Noted increased speed in ambulation.   [facility] but was unable to meet with them due to MD appointment.  [facility] to call on Monday to make a meeting time. PT Plan: continue with manual lymph decongestion.      Goals  progessing  Problem List Patient Active Problem List   Diagnosis Date Noted  . Lymphedema of both  lower extremities 12/17/2013  . Difficulty in walking(719.7) 12/17/2013       GP    Graylin Sperling,CINDY 01/29/2014, 5:06 PM

## 2014-02-01 ENCOUNTER — Ambulatory Visit (HOSPITAL_COMMUNITY)
Admission: RE | Admit: 2014-02-01 | Discharge: 2014-02-01 | Disposition: A | Discharge status: Home / Self Care | Payer: Medicare Other | Source: Ambulatory Visit | Attending: *Deleted | Admitting: *Deleted

## 2014-02-01 DIAGNOSIS — IMO0001 Reserved for inherently not codable concepts without codable children: Secondary | ICD-10-CM | POA: Diagnosis not present

## 2014-02-01 NOTE — Progress Notes (Signed)
Physical Therapy Treatment Patient Details  Name: Rusty AusGloria D Renbarger MRN: [id number] Date of Birth: October 03, 1945  Today's Date: 02/01/2014 Time: 4098-11911347-1425 PT Time Calculation (min): 38 min  Visit#: 17 of 21  Re-eval: 02/12/14 Authorization: [employer] medicare  Authorization Visit#: 17 of 20  Charges:  Manual 38  Subjective: Symptoms/Limitations Symptoms: Pt states she went to her class reunion and was able to walk into the facility, stand around and take pictures.  Pt reports she was proud of herself for being able to do this.  PT has also been complaint with her HEP and is showing signs of increasing LE strength.  Pain Assessment Currently in Pain?: No/denies   Objective: Manual Therapy Manual Therapy: Other (comment) Edema Management: Pt recieved manual decongestive techniques for B LE routing fluid using supraclavicular, superficial and deep abdominal as well as anterior and posterior inguinal-axillary anastomosis. Posterior aspect completed with pt foot placed on bolster. Began manual with Lt LE first to be able to spend more time decongesting Lt LE especially posterior aspect   Physical Therapy Assessment and Plan PT Assessment and Plan Clinical Impression Statement: LE's continue to soften and decongest.  Improved mobility and patient reports feeling better/stronger overall.  [facility] PT Plan: continue with manual lymph decongestion.  Re-evaluate next week.     Problem List Patient Active Problem List   Diagnosis Date Noted  . Lymphedema of both lower extremities 12/17/2013  . Difficulty in walking(719.7) 12/17/2013    Lurena NidaAmy B Rian Koon, PTA/CLT 02/01/2014, 3:56 PM

## 2014-02-03 ENCOUNTER — Ambulatory Visit (HOSPITAL_COMMUNITY)
Admission: RE | Admit: 2014-02-03 | Discharge: 2014-02-03 | Disposition: A | Discharge status: Home / Self Care | Payer: Medicare Other | Source: Ambulatory Visit | Attending: *Deleted | Admitting: *Deleted

## 2014-02-03 DIAGNOSIS — R262 Difficulty in walking, not elsewhere classified: Secondary | ICD-10-CM

## 2014-02-03 DIAGNOSIS — I890 Lymphedema, not elsewhere classified: Secondary | ICD-10-CM

## 2014-02-03 DIAGNOSIS — IMO0001 Reserved for inherently not codable concepts without codable children: Secondary | ICD-10-CM | POA: Diagnosis not present

## 2014-02-03 NOTE — Progress Notes (Signed)
Physical Therapy Treatment Patient Details  Name: Laurie Hunter MRN: [id number] Date of Birth: July 05, 1946  Today's Date: 02/03/2014 Time: 4098-11911347-1432 PT Time Calculation (min): 45 min Charge:  Manual [id number] Visit#: 18 of 21  Re-eval: 02/12/14   Authorization: [employer] medicare  Authorization Visit#: 18 of 20   Subjective: Symptoms/Limitations Symptoms: Pt states she is trying to walk more.  Exercise/Treatments   Seated Other Seated Knee Exercises: sit to stand x 10 rep  Supine Heel Slides: 15 reps Bridges: 15 reps Straight Leg Raises: 15 reps Other Supine Knee Exercises: hip ab/adduction x 15   Manual Therapy Edema Management: Pt recieved manual decongestive techniques for B LE routing fluid using supraclavicular, superficial and deep abdominal as well as anterior and posterior inguinal-axillary anastomosis. Posterior aspect completed with pt foot placed on bolster. Began manual with Lt LE first to be able to spend more time decongesting Lt LE especially posterior aspect   Physical Therapy Assessment and Plan PT Assessment and Plan Clinical Impression Statement: Pt has noted decreased induration on posterior aspect of Lt LE.  Pt going to have her LE brace on her Lt LE evaluated as her ankle needs increased stability.   PT Plan: continue with manual lymph decongestion.  Re-evaluate next week.    Goals  progressing  Problem List Patient Active Problem List   Diagnosis Date Noted  . Lymphedema of both lower extremities 12/17/2013  . Difficulty in walking(719.7) 12/17/2013    PT Plan of Care PT Home Exercise Plan: given   GP Functional Assessment Tool Used: clinical judgement  RUSSELL,CINDY 02/03/2014, 4:14 PM

## 2014-02-04 ENCOUNTER — Ambulatory Visit (HOSPITAL_COMMUNITY)
Admission: RE | Admit: 2014-02-04 | Discharge: 2014-02-04 | Disposition: A | Discharge status: Home / Self Care | Payer: Medicare Other | Source: Ambulatory Visit | Attending: Internal Medicine | Admitting: Internal Medicine

## 2014-02-04 DIAGNOSIS — IMO0001 Reserved for inherently not codable concepts without codable children: Secondary | ICD-10-CM | POA: Diagnosis not present

## 2014-02-04 DIAGNOSIS — R262 Difficulty in walking, not elsewhere classified: Secondary | ICD-10-CM

## 2014-02-04 DIAGNOSIS — I890 Lymphedema, not elsewhere classified: Secondary | ICD-10-CM

## 2014-02-04 NOTE — Progress Notes (Signed)
Physical Therapy Treatment Patient Details  Name: Laurie Hunter MRN: [id number] Date of Birth: January 06, 1946  Today's Date: 02/04/2014 Time: 7829-56211435-1525 PT Time Calculation (min): 50 min Charge:  Manual [id number]; there ex [id number] Visit#: 19 of 21  Re-eval: 02/12/14    Authorization: [employer] medicare   Authorization Visit#: 19 of 20   Subjective: Symptoms/Limitations Symptoms: Ms. Laurie Hunter states that she is going to try to go to church Sunday which she has not done in months due to the difficulty of her walking     Exercise/Treatments  Mid joint line         Rt 73.5  last week 76.0                              Lt 63.8                   67.0 2" supra patellar   Rt 87.7                  85.2                              Lt 91.0                   91.2 4"                          Rt87.0                   87.2                              Lt 91.3                   91.0 6"                          Rt 85.5                  89.2                              Lt 90.6                   88 .0  Seated Other Seated Knee Exercises: sit to stand x 10 rep  Supine Heel Slides: 15 reps Bridges: 15 reps Straight Leg Raises: 15 reps Other Supine Knee Exercises: hip ab/adduction x 15   Manual Therapy Edema Management: Pt recieved manusl degongestive techniques with increased focus on Lt LE.  Completed supraclavicular, deep and superfical abdominal as well as routing fluing form LE  to UE via B inguinal-axillary anastomosis.  Posterior aspect of thigh completed with foot on bolster.   Physical Therapy Assessment and Plan PT Assessment and Plan Clinical Impression Statement: Pt continues to lose fluid in LE which in allowing pt to ambulate with greater ease.  Pt is going to be measured for capri compression garment on Monday. PT Plan: continue with manual lymph decongestion.  G-code due next treatmentl     Goals    Problem List Patient Active Problem List   Diagnosis Date Noted  . Lymphedema of  both lower extremities 12/17/2013  . Difficulty in walking(719.7) 12/17/2013  GP    Laurie Hunter,Laurie Hunter 02/04/2014, 4:44 PM

## 2014-02-05 ENCOUNTER — Ambulatory Visit (HOSPITAL_COMMUNITY): Payer: Medicare Other | Admitting: Physical Therapy

## 2014-02-08 ENCOUNTER — Ambulatory Visit (HOSPITAL_COMMUNITY)
Admission: RE | Admit: 2014-02-08 | Discharge: 2014-02-08 | Disposition: A | Discharge status: Home / Self Care | Payer: Medicare Other | Source: Ambulatory Visit | Attending: Internal Medicine | Admitting: Internal Medicine

## 2014-02-08 DIAGNOSIS — IMO0001 Reserved for inherently not codable concepts without codable children: Secondary | ICD-10-CM | POA: Diagnosis not present

## 2014-02-08 NOTE — Progress Notes (Signed)
Physical Therapy Treatment Patient Details  Name: Laurie Hunter MRN: [id number] Date of Birth: Dec 09, 1945  Today's Date: 02/08/2014 Time: 1225-1300 PT Time Calculation (min): 35 min  Visit#: 20 of 21  Re-eval: 02/12/14 Authorization: [employer] medicare  Authorization Visit#: 20 of 20  Charges: manual 35  Subjective: Symptoms/Limitations Symptoms: Pt states she got fitted this morining for compression garments.  Pt reports she is trying to get more activity at home.  Pain Assessment Currently in Pain?: No/denies   Objective: Manual Therapy Edema Management: Pt received manual decongestive techniques for B LE routing fluid using bilateral inguinal-axillary pathways. Posterior thigh was completed by placing  LE's on bolsters.  Physical Therapy Assessment and Plan PT Assessment and Plan Clinical Impression Statement: PT continues to be compliant with therapy.  Noted improvment in ambulation and overall gait quality.   PT Plan: Re-evaluate next session.     Problem List Patient Active Problem List   Diagnosis Date Noted  . Lymphedema of both lower extremities 12/17/2013  . Difficulty in walking(719.7) 12/17/2013       GP Functional Assessment Tool Used: clinical judgement Functional Limitation: Other PT primary Other PT Primary Current Status (W2956(G8990): At least 60 percent but less than 80 percent impaired, limited or restricted Other PT Primary Goal Status (O1308(G8991): At least 60 percent but less than 80 percent impaired, limited or restricted  Lurena NidaAmy B Frazier, PTA/CLT 02/08/2014, 2:07 PM

## 2014-02-10 ENCOUNTER — Ambulatory Visit (HOSPITAL_COMMUNITY)
Admission: RE | Admit: 2014-02-10 | Discharge: 2014-02-10 | Disposition: A | Discharge status: Home / Self Care | Payer: Medicare Other | Source: Ambulatory Visit | Attending: Internal Medicine | Admitting: Internal Medicine

## 2014-02-10 DIAGNOSIS — IMO0001 Reserved for inherently not codable concepts without codable children: Secondary | ICD-10-CM | POA: Diagnosis not present

## 2014-02-10 NOTE — Progress Notes (Signed)
Physical Therapy Re-evaluation  Patient Details  Name: NAYLEAH GAMEL MRN: [id number] Date of Birth: [birth year]  Today's Date: 02/10/2014 Time: 1350-1435 PT Time Calculation (min): 45 min              Visit#: 21 of 33  Re-eval: 03/10/14 Authorization: [employer] medicare    Authorization Visit#: 21 of 30  Charges:  Manual 45   Subjective Symptoms/Limitations Symptoms: Pt states she is really working hard with her exercises and trying to be more independent.  Pt was fitted for compression garments earlier this week and is excited to get them. Pain Assessment Currently in Pain?: No/denies   Objective: Manual Therapy Manual Therapy: Edema management Edema Management: Bilateral LE's were measured in seated position.  Pt received manual decongestive techniques for B LE routing fluid using bilateral inguinal-axillary pathways. Posterior thigh was completed by placing LE's on bolsters  Measurement in centimeters of Bilateral LE's in seated position (sitting EOB) Date 12/17/2013 01/13/2014 01/29/2014 02/04/2014 02/10/2014   Right right right right right  Mid patella 81.00 79.00 76 73.5 78.2  2" suprapatella 93 89 85.20 87.70 84.00  4" suprapatella 92.50 88.50 87.20 87.00 84.30  6" suprapatella 93.80 90.00 89.20 85.50 87.40         Sum of squares 32564.69 30094.25 28595.52 27972.79 27916.49  Total Volume 10365.67 9579.301 9102.24 8904.256 3893.098   Date 12/17/2013 01/13/2014 01/29/2014 02/04/2014 02/10/2014   left left left left left  Mid patella 84.00 71.00 67 63.8 85  2" suprapatella 95.4 87.3 91.20 91.00 88.00  4" suprapatella 95.80 91.40 91.00 91.30 87.20  6" suprapatella 91.40 91.40 88.00 90.60 87.60         Sum of squares 33688.76 29370.21 28831.44 28895.49 30246.60  Total Volume 10723.47 7342.876 9177.336 8115.726 9627.795   Please note last treatment was taken by a different individual.  Prior measurement was at mid jt not mid patella therefore mid patella is inaccurate.  Physical  Therapy Assessment and Plan PT Assessment and Plan Clinical Impression Statement: Bilateral LE's continue to decompress.  Noted more reduction in Lt LE this week than Rt., however approximate total of 1,479.58cc loss from [place] and 1,095.68cc loss from Lt LE since beginning therapy in June.  Pt is progressing well toward all goals and is independent with HEP.   PT Frequency: Min 3X/week PT Duration: 4 weeks PT Plan: Recommended continuation of manual lymph drainage due to continued decompression of LE's.      Goals PT Short Term Goals Time to Complete Short Term Goals: 2 weeks PT Short Term Goal 1: Pt to be able to verbalize the importance of compression as a lifetime necessity to control edema PT Short Term Goal 1 - Progress: Met PT Short Term Goal 2: Pt to have obtained  Capri biking shorts PT Short Term Goal 2 - Progress: Not met PT Short Term Goal 3: Pt volume to have decreased  by 20 % using cone formula  PT Long Term Goals Time to Complete Long Term Goals: 4 weeks PT Long Term Goal 1: Pt to be able to verbalize the importance of skin care PT Long Term Goal 1 - Progress: Met PT Long Term Goal 2: Pt volume to be decreased by 50% PT Long Term Goal 2 - Progress: Progressing toward goal Long Term Goal 3: Pt to be walking for at least 10 minutes at a time for better health habits. Long Term Goal 3 Progress: Progressing toward goal  Problem List Patient Active Problem List   Diagnosis  Date Noted  . Lymphedema of both lower extremities 12/17/2013  . Difficulty in walking(719.7) 12/17/2013    GP Functional Assessment Tool Used: clinical judgement Gcode taken last visit  Teena Irani, PTA/CLT 02/10/2014, 3:25 PM

## 2014-02-12 ENCOUNTER — Ambulatory Visit (HOSPITAL_COMMUNITY)
Admission: RE | Admit: 2014-02-12 | Discharge: 2014-02-12 | Disposition: A | Discharge status: Home / Self Care | Payer: Medicare Other | Source: Ambulatory Visit | Attending: Internal Medicine | Admitting: Internal Medicine

## 2014-02-12 DIAGNOSIS — I890 Lymphedema, not elsewhere classified: Secondary | ICD-10-CM

## 2014-02-12 DIAGNOSIS — R262 Difficulty in walking, not elsewhere classified: Secondary | ICD-10-CM

## 2014-02-12 DIAGNOSIS — IMO0001 Reserved for inherently not codable concepts without codable children: Secondary | ICD-10-CM | POA: Diagnosis not present

## 2014-02-12 NOTE — Progress Notes (Signed)
Physical Therapy Treatment Patient Details  Name: Laurie Hunter MRN: [id number] Date of Birth: 1946-06-08  Today's Date: 02/12/2014 Time: 4098-11911355-1439 PT Time Calculation (min): 44 min Charge:  Manual 1355-1430; there ex [id number] Visit#: 22 of 333  Re-eval: 03/10/14    Authorization: [employer] medicare   Authorization Visit#: 21 of 30   Subjective: Symptoms/Limitations Symptoms: Pt states she is trying to be as active as she can be.       Exercise/Treatments     Manual Therapy Edema Management: Pt recieved manual decongestive techniqes for B LE to include supraclavicular, deep and superfiscial abdominal, Routing fluid using inguinal-axillary anastomisis and LE from knee to thigh both anteriorly and posteriorly.  Posterior thigh completed by placint LE on bolser.    Physical Therapy Assessment and Plan PT Assessment and Plan Clinical Impression Statement: Pt is able to come from sit to stand without UE assist at this time.   Pt noting continued increased urination  PT Plan: Recommended continuation of manual lymph drainage due to continued decompression of LE's.         Problem List Patient Active Problem List   Diagnosis Date Noted  . Lymphedema of both lower extremities 12/17/2013  . Difficulty in walking(719.7) 12/17/2013       GP Functional Assessment Tool Used: clinical judgement  RUSSELL,CINDY 02/12/2014, 3:35 PM

## 2014-02-16 ENCOUNTER — Ambulatory Visit (HOSPITAL_COMMUNITY)
Admission: RE | Admit: 2014-02-16 | Discharge: 2014-02-16 | Disposition: A | Discharge status: Home / Self Care | Payer: Medicare Other | Source: Ambulatory Visit | Attending: Internal Medicine | Admitting: Internal Medicine

## 2014-02-16 DIAGNOSIS — R262 Difficulty in walking, not elsewhere classified: Secondary | ICD-10-CM | POA: Diagnosis not present

## 2014-02-16 DIAGNOSIS — IMO0001 Reserved for inherently not codable concepts without codable children: Secondary | ICD-10-CM | POA: Diagnosis present

## 2014-02-16 DIAGNOSIS — I890 Lymphedema, not elsewhere classified: Secondary | ICD-10-CM | POA: Diagnosis not present

## 2014-02-16 NOTE — Progress Notes (Signed)
Physical Therapy Treatment Patient Details  Name: Laurie AusGloria D Lovings MRN: [id number] Date of Birth: Sep 26, 1945  Today's Date: 02/16/2014 Time: 8295-62131155-1238 PT Time Calculation (min): 43 min Charge:  Manual [id number]; there ex [id number] Visit#: 23 of 33  Re-eval: 03/10/14    Authorization: [employer] medicare  Authorization Visit#: 22 of 30   Subjective: Symptoms/Limitations Symptoms: Pt states she has been coming sit to stand without using her hands    Exercise/Treatments   Seated Other Seated Knee Exercises: sit to stand x 10 rep  Supine Heel Slides: 15 reps Bridges: 15 reps Straight Leg Raises: 15 reps Other Supine Knee Exercises: hip ab/adduction x 15    Manual Therapy Edema Management: Pt recieved manual decongestive techniqes for B LE to include supraclavicular, deep and superficcial abdominal, Routing fluid using inguinal-axillary anastomisis. LE decongested from knee to thigh both anteriorly and posteriorly.  Posterior thigh completed by placing LE's on bolser.    Physical Therapy Assessment and Plan PT Assessment and Plan Clinical Impression Statement: Pt continues to improve in mobility of LE due to decreased weight.  Decreased induration noted along posterior aspect . PT Plan: remeasure LE next treatement.    Goals  progressing   Problem List Patient Active Problem List   Diagnosis Date Noted  . Lymphedema of both lower extremities 12/17/2013  . Difficulty in walking(719.7) 12/17/2013       GP    Joia Doyle,CINDY 02/16/2014, 4:03 PM

## 2014-02-24 ENCOUNTER — Ambulatory Visit (HOSPITAL_COMMUNITY)
Admission: RE | Admit: 2014-02-24 | Discharge: 2014-02-24 | Disposition: A | Discharge status: Home / Self Care | Payer: Medicare Other | Source: Ambulatory Visit | Attending: Internal Medicine | Admitting: Internal Medicine

## 2014-02-24 DIAGNOSIS — IMO0001 Reserved for inherently not codable concepts without codable children: Secondary | ICD-10-CM | POA: Diagnosis not present

## 2014-02-24 NOTE — Progress Notes (Signed)
Physical Therapy Treatment Patient Details  Name: Rusty AusGloria D Clavijo MRN: [id number] Date of Birth: May 17, 1946  Today's Date: 02/24/2014 Time: 4098-11911435-1518 PT Time Calculation (min): 43 min  Visit#: 24 of 33  Re-eval: 03/10/14 Authorization: BCBS medicare  Authorization Visit#: 24 of 30  Charges:  Manual 42  Subjective: Symptoms/Limitations Symptoms: Pt states she continues to do better and is moving much better than she did before.   Objective: Manual Therapy Edema Management: Manual lymph drainage for bilateral LE's routing inguinal-axillary pathways anteriorly.  Posterior access by bolster bilateral LE's.   Date 12/17/2013 01/13/2014 01/29/2014 02/04/2014 02/10/2014 02/24/2014   left left left left left left  Mid patella 84.00 71.00 67 63.8 85 85.5  2" suprapatella 95.4 87.3 91.20 91.00 88.00 87.00  4" suprapatella 95.80 91.40 91.00 91.30 87.20 85.40  6" suprapatella 91.40 91.40 88.00 90.60 87.60 86.50          Sum of squares 33688.76 29370.21 28831.44 28895.49 30246.60 29654.66  Total Volume 10723.47 4782.9569348.832 9177.336 2130.8659197.723 9627.795 9439.375   Date 12/17/2013 01/13/2014 01/29/2014 02/04/2014 02/10/2014 02/24/2014   Right right right right right right  Mid patella 81.00 79.00 76 73.5 78.2 78  2" suprapatella 93 89 85.20 87.70 84.00 84.60  4" suprapatella 92.50 88.50 87.20 87.00 84.30 84.30  6" suprapatella 93.80 90.00 89.20 85.50 87.40 87.50          Sum of squares 32564.69 30094.25 28595.52 27972.79 27916.49 28003.90  Total Volume 10365.67 9579.301 9102.24 8904.784 6962019 8886.098 95288913.921    Physical Therapy Assessment and Plan PT Assessment and Plan Clinical Impression Statement: Bilateral LE's remeasured today.  Reduction of Lt LE of 188.42 cc, however Rt LE with slight gain of 27.82 cc.  Pt to get her compression capris next week.  Overall progressing well towards goals.  PT Plan: COntinue manual lymph drainage with measurements on Wednesdays.      Problem List Patient Active Problem  List   Diagnosis Date Noted  . Lymphedema of both lower extremities 12/17/2013  . Difficulty in walking(719.7) 12/17/2013      Lurena NidaAmy B Cristoval Teall, PTA/CLT 02/24/2014, 7:38 PM

## 2014-02-26 ENCOUNTER — Ambulatory Visit (HOSPITAL_COMMUNITY)
Admission: RE | Admit: 2014-02-26 | Discharge: 2014-02-26 | Disposition: A | Discharge status: Home / Self Care | Payer: Medicare Other | Source: Ambulatory Visit | Attending: Internal Medicine | Admitting: Internal Medicine

## 2014-02-26 DIAGNOSIS — IMO0001 Reserved for inherently not codable concepts without codable children: Secondary | ICD-10-CM | POA: Diagnosis not present

## 2014-02-26 NOTE — Progress Notes (Signed)
Physical Therapy Treatment Patient Details  Name: Laurie Hunter MRN: [id number] Date of Birth: 06/11/1946  Today's Date: 02/26/2014 Time: 4098-11911155-1243 PT Time Calculation (min): 48 min Charge:  Manual [id number] Visit#: 25 of 33  Re-eval: 03/10/14    Authorization: [employer] medicare  Authorization Visit#: 25 of 30   Subjective: Symptoms/Limitations Symptoms: Pt has no complaints    Exercise/Treatments    Seated Other Seated Knee Exercises: sit to stand x 10 rep at a lower height than before. Supine Heel Slides: 15 reps Bridges: 20 reps Straight Leg Raises: 20 reps Other Supine Knee Exercises: hip ab/adduction x 15 Sidelying Other Sidelying Knee Exercises: roll to Rt; roll to Lt x 5 reps    Manual Therapy Edema Management: Pt recieved manual decongestive techniqes for B LE to include supraclavicular, deep and superficcial abdominal, Routing fluid using inguinal-axillary anastomisis. LE decongested from knee to thigh both anteriorly and posteriorly.  Posterior thigh completed by placing LE's on bolser as well as with pt sidelying   Physical Therapy Assessment and Plan PT Assessment and Plan Clinical Impression Statement: Pt continues to increase her ambulation at home. Noted induration on posterior aspect of Lt LE with increases manual spent while pt was in sidelying postiion.  PT Plan: COntinue manual lymph drainage with measurements on Wednesdays.        Problem List Patient Active Problem List   Diagnosis Date Noted  . Lymphedema of both lower extremities 12/17/2013  . Difficulty in walking(719.7) 12/17/2013       GP    RUSSELL,CINDY 02/26/2014, 1:30 PM

## 2014-03-01 ENCOUNTER — Ambulatory Visit (HOSPITAL_COMMUNITY)
Admission: RE | Admit: 2014-03-01 | Discharge: 2014-03-01 | Disposition: A | Discharge status: Home / Self Care | Payer: Medicare Other | Source: Ambulatory Visit | Attending: Internal Medicine | Admitting: Internal Medicine

## 2014-03-01 DIAGNOSIS — I890 Lymphedema, not elsewhere classified: Secondary | ICD-10-CM

## 2014-03-01 DIAGNOSIS — IMO0001 Reserved for inherently not codable concepts without codable children: Secondary | ICD-10-CM | POA: Diagnosis not present

## 2014-03-01 DIAGNOSIS — R262 Difficulty in walking, not elsewhere classified: Secondary | ICD-10-CM

## 2014-03-01 NOTE — Progress Notes (Signed)
Physical Therapy Treatment Patient Details  Name: Laurie Hunter MRN: [id number] Date of Birth: 06/06/1946  Today's Date: 03/01/2014 Time: 8295-62131430-1515 PT Time Calculation (min): 45 min Charge: manual 1430-1500; there ex 1500-1515 Visit#: 26 of 33  Re-eval: 03/10/14    Authorization: [employer] medicare  Authorization Visit#: 25 of 30   Subjective: Symptoms/Limitations Symptoms: Pt states she has been working on moving more over the weekend Pain Assessment Currently in Pain?: No/denies   Exercise/Treatments     Supine Heel Slides: 15 reps Bridges: 20 reps Straight Leg Raises: 20 reps Other Supine Knee Exercises: hip ab/adduction x 15 Sidelying   Prone  Hamstring Curl: 10 reps Hip Extension: 10 reps   Manual Therapy Edema Management: Pt recieved manual decongestive techniqes for B LE to include supraclavicular, deep and superficcial abdominal, Routing fluid using inguinal-axillary anastomisis. LE decongested from knee to thigh both anteriorly and posteriorly.     Physical Therapy Assessment and Plan PT Assessment and Plan Clinical Impression Statement: Pt able to roll to her stomach now making manual decongestive on posterior aspect of thigh much easier.  PT Plan: COntinue manual lymph drainage with measurements on Wednesdays.     Goals  Progressing   Problem List Patient Active Problem List   Diagnosis Date Noted  . Lymphedema of both lower extremities 12/17/2013  . Difficulty in walking(719.7) 12/17/2013       GP    RUSSELL,CINDY 03/01/2014, 5:40 PM

## 2014-03-03 ENCOUNTER — Ambulatory Visit (HOSPITAL_COMMUNITY)
Admission: RE | Admit: 2014-03-03 | Discharge: 2014-03-03 | Disposition: A | Discharge status: Home / Self Care | Payer: Medicare Other | Source: Ambulatory Visit | Attending: Internal Medicine | Admitting: Internal Medicine

## 2014-03-03 DIAGNOSIS — IMO0001 Reserved for inherently not codable concepts without codable children: Secondary | ICD-10-CM | POA: Diagnosis not present

## 2014-03-03 NOTE — Progress Notes (Signed)
Physical Therapy Treatment Patient Details  Name: Laurie Hunter MRN: [id number] Date of Birth: 12-01-1945  Today's Date: 03/03/2014 Time: 1300-1340 PT Time Calculation (min): 40 min  Visit#: 27 of 33  Re-eval: 03/10/14 Authorization: [employer] medicare  Authorization Time Period:    Authorization Visit#: 27 of 30  Charges:  Manual 1300-1330,gait 1330-1340 (10')  Subjective: Symptoms/Limitations Symptoms: Fitter met with patient today for fitting of capri compression garment.  Sister present for education/donning and doffing of garment.  Pt reported general comfort following. Pain Assessment Currently in Pain?: No/denies   Exercise/Treatments Standing Gait Training: quad cane X 150 feet with SBA   Manual Therapy Edema Management: Manual Lymph Drainage for bilateral LE's, Routing fluid using inguinal-axillary anastomisis bilaterally anteriorly and posteriorly  Physical Therapy Assessment and Plan PT Assessment and Plan Clinical Impression Statement: Overall comfort and good fit of compression capris.  Worked on gait with QC today follow manual lymph drainage.  Pt required verbal cues to increase step length and maintain erect posture.   PT Plan: COntinue manual lymph drainage with measurements on Wednesdays.   Re-evaluate X 3 more visits.  Continue to decrease to LRAD and increase stregnth.     Problem List Patient Active Problem List   Diagnosis Date Noted  . Lymphedema of both lower extremities 12/17/2013  . Difficulty in walking(719.7) 12/17/2013     Teena Irani, PTA/CLT 03/03/2014, 3:42 PM

## 2014-03-05 ENCOUNTER — Ambulatory Visit (HOSPITAL_COMMUNITY)
Admission: RE | Admit: 2014-03-05 | Discharge: 2014-03-05 | Disposition: A | Discharge status: Home / Self Care | Payer: Medicare Other | Source: Ambulatory Visit | Attending: Internal Medicine | Admitting: Internal Medicine

## 2014-03-05 DIAGNOSIS — R262 Difficulty in walking, not elsewhere classified: Secondary | ICD-10-CM

## 2014-03-05 DIAGNOSIS — I890 Lymphedema, not elsewhere classified: Secondary | ICD-10-CM

## 2014-03-05 DIAGNOSIS — IMO0001 Reserved for inherently not codable concepts without codable children: Secondary | ICD-10-CM | POA: Diagnosis not present

## 2014-03-05 NOTE — Progress Notes (Signed)
Physical Therapy Treatment Patient Details  Name: Laurie Hunter MRN: [id number] Date of Birth: 1946-05-26  Today's Date: 03/05/2014 Time: 1335-1430 PT Time Calculation (min): 55 min  Visit#: 28 of 33  Re-eval: 03/10/14    Authorization: [employer] medicare  Authorization Time Period:    Authorization Visit#: 28 of 30   Subjective: Symptoms/Limitations Symptoms: Pt comes to the department today stating that her pants fall down when she walks. Pt comes using her quad cane.  M   Exercise/Treatments Manual Therapy  Edema Management: Pt recieved manual decongestive techniqes for B LE to include supraclavicular, deep and superficcial abdominal, Routing fluid using inguinal-axillary anastomisis. LE decongested from knee to thigh both anteriorly and posteriorly.    Physical Therapy Assessment and Plan PT Assessment and Plan Clinical Impression Statement: Tightened capri compression garment.  Pt advised not to use quad cane until she gets her new AFO as Lt ankle rolls out with ambulation therefore therapist feels that pt needs the stability of the walker at this time.  PT Plan: COntinue manual lymph drainage with measurements on Wednesdays.   Re-evaluate X 32more visits.  Continue to decrease to LRAD and increase stregnth.    Goals    Problem List Patient Active Problem List   Diagnosis Date Noted  . Lymphedema of both lower extremities 12/17/2013  . Difficulty in walking(719.7) 12/17/2013       GP    Ilanna Deihl,CINDY 03/05/2014, 4:20 PM

## 2014-03-08 ENCOUNTER — Ambulatory Visit (HOSPITAL_COMMUNITY)
Admission: RE | Admit: 2014-03-08 | Discharge: 2014-03-08 | Disposition: A | Discharge status: Home / Self Care | Payer: Medicare Other | Source: Ambulatory Visit | Attending: Internal Medicine | Admitting: Internal Medicine

## 2014-03-08 DIAGNOSIS — IMO0001 Reserved for inherently not codable concepts without codable children: Secondary | ICD-10-CM | POA: Diagnosis not present

## 2014-03-08 NOTE — Progress Notes (Signed)
Physical Therapy Treatment Patient Details  Name: Laurie Hunter MRN: [id number] Date of Birth: September 04, 1945  Today's Date: 03/08/2014 Time: 1304-1350 PT Time Calculation (min): 46 min  Visit#: 29 of 33  Re-eval: 03/10/14 Authorization: [employer] medicare  Authorization Visit#: 29 of 30  Charges:  Manual 40  Subjective: Symptoms/Limitations Symptoms: Pt states she is able to get her garments on and off with help from her sister.  States they are staying up fine now.  Pt gets her new brace for Lt LE tomorrow.   Objective: Manual Therapy Edema Management: Manual Lymph Drainage for bilateral LE's, Routing fluid using inguinal-axillary anastom0sis bilaterally anteriorly and posteriorly  Physical Therapy Assessment and Plan PT Assessment and Plan Clinical Impression Statement: Noted reduction in fluid and induration bilaterally with addition of compression garment.  Pt is complaint with donning in am and doffing in pm.  Pt able to verbalize washing procedure for her garment.  Pt requires mod assist with bathroom/hygiene due to inability to don/doff garment unassisted.  PT Plan: Re-evaluate X 2 more visits, Gcode update next visit.  Continue manual lymph drainage.     Problem List Patient Active Problem List   Diagnosis Date Noted  . Lymphedema of both lower extremities 12/17/2013  . Difficulty in walking(719.7) 12/17/2013      Laurie Hunter, PTA/CLT 03/08/2014, 2:35 PM

## 2014-03-10 ENCOUNTER — Ambulatory Visit (HOSPITAL_COMMUNITY)
Admission: RE | Admit: 2014-03-10 | Discharge: 2014-03-10 | Disposition: A | Discharge status: Home / Self Care | Payer: Medicare Other | Source: Ambulatory Visit | Attending: Internal Medicine | Admitting: Internal Medicine

## 2014-03-10 DIAGNOSIS — IMO0001 Reserved for inherently not codable concepts without codable children: Secondary | ICD-10-CM | POA: Diagnosis not present

## 2014-03-10 NOTE — Progress Notes (Signed)
Physical Therapy Treatment / Rosel[facility] R[facility] update Patient Details  Name: Laurie Hunter MRN: [id number] Date of Birth: 09-05-1945  Today's Date: 03/10/2014 Time: 1355-1435 PT Time Calculation (min): 40 min  Visit#: 30 of 33  Re-eval: 03/10/14 Authorization: [employer] medicare  Authorization Visit#: 30 of 30  Charges:  Manual 40  Subjective: Symptoms/Limitations Symptoms: Pt states she went to orthotist yesterday and her shoe/brace should be ready in 7 days.  States he is going to add a T-bar to give her ankle more stability.  Pt reports compliance wtih compression garments and comes today wearing garment.  Pain Assessment Currently in Pain?: No/denies  Objective: Manual Therapy Edema Management: Manual Lymph Drainage for bilateral LE's, Routing fluid via inguinal-axillary pathways bilaterally anteriorly and posteriorly  Physical Therapy Assessment and Plan PT Assessment and Plan Clinical Impression Statement: G code completed today.  Bilateral LE's continue to reduce with noted improvement in ambulation.  Basic mobility including transfers/bed mobility completed with increasing ease.  Pt compliant with donning/doffing garment, however needs assistance to completel.   PT Plan: Re-evaluate next visit.     Problem List Patient Active Problem List   Diagnosis Date Noted  . Lymphedema of both lower extremities 12/17/2013  . Difficulty in walking(719.7) 12/17/2013    GP Functional Assessment Tool Used: clinical judgement Functional Limitation: Other PT primary Other PT Primary Current Status (W2956): At least 60 percent but less than 80 percent impaired, limited or restricted Other PT Primary Goal Status (O1308): At least 60 percent but less than 80 percent impaired, limited or restricted  [name], PTA/CLT 03/10/2014, 4:08 PM

## 2014-03-12 ENCOUNTER — Ambulatory Visit (HOSPITAL_COMMUNITY)
Admission: RE | Admit: 2014-03-12 | Discharge: 2014-03-12 | Disposition: A | Discharge status: Home / Self Care | Payer: Medicare Other | Source: Ambulatory Visit | Attending: Internal Medicine | Admitting: Internal Medicine

## 2014-03-12 DIAGNOSIS — I890 Lymphedema, not elsewhere classified: Secondary | ICD-10-CM

## 2014-03-12 DIAGNOSIS — R262 Difficulty in walking, not elsewhere classified: Secondary | ICD-10-CM

## 2014-03-12 DIAGNOSIS — IMO0001 Reserved for inherently not codable concepts without codable children: Secondary | ICD-10-CM | POA: Diagnosis not present

## 2014-03-12 NOTE — Evaluation (Signed)
Physical Therapy Evaluation  Patient Details  Name: Laurie Hunter MRN: [id number] Date of Birth: 09-Jan-1946  Today's Date: 03/12/2014 Time: 1345-1440 PT Time Calculation (min): 55 min              Visit#: 31 of 40  Re-eval: 04/02/14    Authorization: [employer]BS medicare    Authorization Time Period:    Authorization Visit#: 9 of 56   Past Medical History:  Past Medical History  Diagnosis Date  . Hypertension   . Diabetes mellitus without complication   . Stroke    Past Surgical History:  Past Surgical History  Procedure Laterality Date  . Gallbladder surgery    . Abdominal hysterectomy      Subjective Symptoms/Limitations Symptoms: Pt states that she is trying to get up every thirty minutes and walk for 5-10 minutes at a time now.  How long can you sit comfortably?: no problem How long can you stand comfortably?: 5 minutes and then she wants to sit down was less than five minutes  How long can you walk comfortably?: Pt can walk for 10 to 15 minutes was 5 minutes with a rolling walker. Pt was walking with a quad cane but therapist stated not to do this until she gets a new orthotic for her Lt LE due to ankle turning in.  Patient Stated Goals: able to stand and brush her teeth and face now was not able to do this, Able to get out of bed I was not able to do this, needs less assist going from sit to stand; toileting is easier, p Pain Assessment Currently in Pain?: No/denies     Date 12/17/2013 7//2015 01/29/2014 02/04/2014 7/29/ 8/12/ 03/12/2014  Left         mid patella 84.00 71.00 67 63.8 85 85.5 71.5  2" superior 95.4 87.3 91.20 91.00 88.00 87.00 79.00  4" superior 95.80 91.40 91.00 91.30 87.20 85.40 86.70  6" superior 91.40 91.40 88.00 90.60 87.60 86.50 88.50                                                                                                                                         Sum of squares 33688.76 29370.21 28831.44 28895.49 14388.87 29654.66  26702.39  Total Volume 10723.47 5797.2820 9177.3357 6015.6153 7943.2761 9439.375 [phone]                12/17/2013 01/13/2014 01/29/2014 02/04/2014 02/10/2014 02/24/2014 03/12/2014  right        81 79 76 73.5 78.2 78 72  93.00 89.00 85.20 87.70 84.00 84.60 78.20  92.50 88.50 87.20 87.00 84.30 84.30 81.50  93.80 90.00 89.20 85.50 87.40 87.50 84.50  63817.71 16579.03 [phone] 27972.79 [phone] 28003.90 25081.74  [id number] [id number] [phone] [id number] 6837.2902     Manual Therapy Edema Management: Manual lymph drainage completed for B LE including supraclavicular, deep and superfical abdominal, Routing fluid using inguinal-axillary anastomosis and LE.  Posterior aspect of thigh completed by placiing LE on bolster.   Physical Therapy Assessment and Plan PT Assessment and Plan Clinical Impression Statement: Pt continues to decongest losing 940 cc from Lt and 930 cc from Rt LE.    Pt had forgotten that therapist asked her to start wearing her pedometer.  Pt is to have her brother set pedometer and begin recording steps taken/day. Pt is also to begin standing when folding clothes.  Pt sister will begin attending sessions to be instructed in lymph massage in preparation for discharge in three weeks.   PT Frequency: Min 3X/week PT Duration:  (for an additional 3 more weeks ) PT Treatment/Interventions: Therapeutic exercise;Manual techniques PT Plan: Instruct family member in lymphedma massage.     Goals PT Short Term Goals PT Short Term Goal 1: Pt to be able to verbalize the importance of compression as a lifetime necessity to control edema PT Short Term Goal 1 - Progress: Met PT Short Term Goal 2: Pt to have obtained  [place] biking shorts PT Short Term Goal 2 - Progress: Met PT Short Term Goal 3: Pt volume to have decreased  by 20 %  using cone formula  PT Long Term Goals Time to Complete Long Term Goals: 4 weeks PT Long Term Goal 1: Pt to be able to verbalize the importance of skin care PT Long Term Goal 1 - Progress: Met PT Long Term Goal 2: Pt volume to be decreased by 50% PT Long Term Goal 2 - Progress: Progressing toward goal Long Term Goal 3: Pt to be walking for at least 10 minutes at a time for better health habits. Long Term Goal 3 Progress: Met Long Term Goal 4: NEW goal as of 03/12/2014- Pt to be able to walk for 20 minutes  Problem List Patient Active Problem List   Diagnosis Date Noted  . Lymphedema of both lower extremities 12/17/2013  . Difficulty in walking(719.7) 12/17/2013       GP    Shuntia Exton,CINDY 03/12/2014, 4:03 PM  Physician Documentation Your signature is required to indicate approval of the treatment plan as stated above.  Please sign and either send electronically or make a copy of this report for your files and return this physician signed original.   Please mark one 1.__approve of plan  2. ___approve of plan with the following conditions.   ______________________________                                                          _____________________ Physician Signature  Date

## 2014-03-16 ENCOUNTER — Ambulatory Visit (HOSPITAL_COMMUNITY)
Admission: RE | Admit: 2014-03-16 | Discharge: 2014-03-16 | Disposition: A | Discharge status: Home / Self Care | Payer: Medicare Other | Source: Ambulatory Visit | Attending: Internal Medicine | Admitting: Internal Medicine

## 2014-03-16 DIAGNOSIS — I890 Lymphedema, not elsewhere classified: Secondary | ICD-10-CM | POA: Diagnosis not present

## 2014-03-16 DIAGNOSIS — IMO0001 Reserved for inherently not codable concepts without codable children: Secondary | ICD-10-CM | POA: Insufficient documentation

## 2014-03-16 DIAGNOSIS — R262 Difficulty in walking, not elsewhere classified: Secondary | ICD-10-CM | POA: Insufficient documentation

## 2014-03-16 NOTE — Progress Notes (Signed)
Physical Therapy Treatment Patient Details  Name: Laurie Hunter MRN: [id number] Date of Birth: [birth year]  Today's Date: 03/16/2014 Time: 1520-1605 PT Time Calculation (min): 45 min Self car 1520-1530; manual 1530-1550; there ex 1550-1603  Visit#: 32 of 40  Re-eval: 04/02/14    Authorization: [employer] medicare     Authorization Visit#: 32 of 40   Subjective: Symptoms/Limitations Symptoms: Pt has no complaint.  S   Exercise/Treatments  all exercises done B       Seated Other Seated Knee Exercises: sit to stand x 10 rep at a lower height than before. Supine Heel Slides: 10 reps Bridges: 10 reps Straight Leg Raises: 10 reps   Prone  Hamstring Curl: 10 reps Hip Extension: 10 reps  Manual Therapy Edema Management: Manual lymph drainage completed for B LE including supraclavicular, deep and superfical abdominal, Routing fluid using inguinal-axillary anastomosis and LE both anteriorly and posteriorly; posterior done while pt was prone.   Physical Therapy Assessment and Plan PT Assessment and Plan Clinical Impression Statement: Sister with patient during treatment to begin learning how to complete manual decongestive techniques for when pt is discharged.  Therapist went over theroy of manual technique and order.  Pt's sister verbalized understanding. PT Plan: Icontinue to instruct family member in lymphedma massage.     Goals    Problem List Patient Active Problem List   Diagnosis Date Noted  . Lymphedema of both lower extremities 12/17/2013  . Difficulty in walking(719.7) 12/17/2013       GP    Terisa Belardo,CINDY 03/16/2014, 4:34 PM

## 2014-03-17 ENCOUNTER — Ambulatory Visit (HOSPITAL_COMMUNITY): Payer: Medicare Other | Admitting: Physical Therapy

## 2014-03-18 ENCOUNTER — Telehealth (HOSPITAL_COMMUNITY): Payer: Self-pay

## 2014-03-19 ENCOUNTER — Ambulatory Visit (HOSPITAL_COMMUNITY): Payer: Medicare Other | Admitting: Physical Therapy

## 2014-03-23 ENCOUNTER — Ambulatory Visit (HOSPITAL_COMMUNITY)
Admission: RE | Admit: 2014-03-23 | Discharge: 2014-03-23 | Disposition: A | Discharge status: Home / Self Care | Payer: Medicare Other | Source: Ambulatory Visit | Attending: Internal Medicine | Admitting: Internal Medicine

## 2014-03-23 DIAGNOSIS — IMO0001 Reserved for inherently not codable concepts without codable children: Secondary | ICD-10-CM | POA: Diagnosis not present

## 2014-03-23 NOTE — Progress Notes (Signed)
Physical Therapy Treatment Patient Details  Name: Laurie Hunter MRN: [id number] Date of Birth: 07-28-1945  Today's Date: 03/23/2014 Time: 1520-1600 PT Time Calculation (min): 40 min  Visit#: 33 of 40  Re-eval: 04/02/14 Authorization: [employer] medicare  Authorization Visit#: 33 of 40  Charges:  Manual 1520-1545 (25'), therex 1545-1600 (15')  Subjective: Symptoms/Limitations Symptoms: Pt comes today with her sister for continued education on massage for HEP.l Pain Assessment Currently in Pain?: No/denies   Objective: Manual Therapy Edema Management: Manual lymph drainage completed for B LE including supraclavicular, deep and superfical abdominal, Routing fluid using inguinal-axillary anastomosis and LE both anteriorly and posteriorly; posterior done while pt was prone  Physical Therapy Assessment and Plan PT Assessment and Plan Clinical Impression Statement: Sister present for ongoing education as she will be assisting patient in self care.  Technique demonstrated to CG and was able  demonstrate technique with cues.  Therex completed today in supine and prone.  NOted continued decongestion of bilateral LE's. PT Plan: Distribute written instructions to caregiver next visit.  continue to educate caregiver with self maintenance assistance for patient.    Goals    Problem List Patient Active Problem List   Diagnosis Date Noted  . Lymphedema of both lower extremities 12/17/2013  . Difficulty in walking(719.7) 12/17/2013       GP    [name], PTA/CLT 03/23/2014, 5:09 PM

## 2014-03-24 ENCOUNTER — Ambulatory Visit (HOSPITAL_COMMUNITY)
Admission: RE | Admit: 2014-03-24 | Discharge: 2014-03-24 | Disposition: A | Discharge status: Home / Self Care | Payer: Medicare Other | Source: Ambulatory Visit | Attending: Internal Medicine | Admitting: Internal Medicine

## 2014-03-24 DIAGNOSIS — R262 Difficulty in walking, not elsewhere classified: Secondary | ICD-10-CM

## 2014-03-24 DIAGNOSIS — IMO0001 Reserved for inherently not codable concepts without codable children: Secondary | ICD-10-CM | POA: Diagnosis not present

## 2014-03-24 DIAGNOSIS — I890 Lymphedema, not elsewhere classified: Secondary | ICD-10-CM

## 2014-03-24 NOTE — Progress Notes (Signed)
Physical Therapy Treatment Patient Details  Name: Laurie Hunter MRN: [id number] Date of Birth: [birth year]  Today's Date: 03/24/2014 Time: 1300-1350 PT Time Calculation (min): 50 min Charge:  man3ual 7829-5621; there ex 3086-5784 Visit#: 34 of 40  Re-eval: 04/02/14    Authorization: [employer] medicare  Authorization Visit#: 34 of 40   Subjective: Symptoms/Limitations Symptoms: Pt comes today with her sister for continued education on massage for HEP.l Pain Assessment Currently in Pain?: No/denies  Exercise/Treatments   Supine Heel Slides: 10 reps Bridges: 10 reps Straight Leg Raises: 10 reps Other Supine Knee Exercises: hip ab/adduction x 10    Prone  Hamstring Curl: 10 reps Hip Extension: 10 reps   Manual Therapy Edema Management: Manual lymph drainage completed for B LE including supraclavicular, deep and superfical abdominal, Routing fluid using inguinal-axillary anastomosis and LE both anteriorly and posteriorly; posterior done while pt was prone  Physical Therapy Assessment and Plan PT Assessment and Plan Clinical Impression Statement: Went over supraclavicular; deep and superficial abdominal manual techniques with sister with hand on hand teaching.  Therapist then continued with the remaning manual decongestive techniques and exxercises.  PT Plan: Distribute written instructions to caregiver next visit.  continue to educate caregiver with self maintenance assistance for patient.    Goals    Problem List Patient Active Problem List   Diagnosis Date Noted  . Lymphedema of both lower extremities 12/17/2013  . Difficulty in walking(719.7) 12/17/2013       GP    Shalisha Clausing,CINDY 03/24/2014, 2:33 PM

## 2014-03-26 ENCOUNTER — Ambulatory Visit (HOSPITAL_COMMUNITY)
Admission: RE | Admit: 2014-03-26 | Discharge: 2014-03-26 | Disposition: A | Discharge status: Home / Self Care | Payer: Medicare Other | Source: Ambulatory Visit | Attending: Internal Medicine | Admitting: Internal Medicine

## 2014-03-26 ENCOUNTER — Ambulatory Visit (HOSPITAL_COMMUNITY): Payer: Medicare Other

## 2014-03-26 DIAGNOSIS — IMO0001 Reserved for inherently not codable concepts without codable children: Secondary | ICD-10-CM | POA: Diagnosis not present

## 2014-03-26 DIAGNOSIS — R262 Difficulty in walking, not elsewhere classified: Secondary | ICD-10-CM

## 2014-03-26 DIAGNOSIS — I890 Lymphedema, not elsewhere classified: Secondary | ICD-10-CM

## 2014-03-26 NOTE — Progress Notes (Signed)
Physical Therapy Treatment Patient Details  Name: Laurie Hunter MRN: [id number] Date of Birth: 12-11-1945  Today's Date: 03/26/2014 Time: 1300-1345 PT Time Calculation (min): 45 min Charge:  Manual 1300-1345 Visit#: 35 of 40   Authorization: [employer] medicare  Authorization Time Period:    Authorization Visit#: 35 of 40   Subjective: Symptoms/Limitations Symptoms: Pt sister states she is unable to stay to work on [employer]; Pt states that she is trying to walk more at home    Measurement:  Mid patella Lt 78.5  Rt 72.2; supra patella LT 87.5; Rt 79.0; 2" supra patella Lt 86.8 Rt 82.8; 4" supra patella Lt 88.5 RT 83.0cm  Exercise/Treatments Manual Therapy Edema Management: Manual lymph drainage completed for B LE including supraclavicular, deep and superfical abdominal, Routing fluid using inguinal-axillary anastomosis and LE both anteriorly and posteriorly; posterior done while pt was prone  Physical Therapy Assessment and Plan PT Assessment and Plan Clinical Impression Statement: Pt decongestion is slowing down considerably at this point.  Assume last measurement for Lt LE is in error as it is an outlier from the other measurements  PT Plan: Anticipate discharge in 5 more visits     Goals    Problem List Patient Active Problem List   Diagnosis Date Noted  . Lymphedema of both lower extremities 12/17/2013  . Difficulty in walking(719.7) 12/17/2013       GP    RUSSELL,CINDY 03/26/2014, 4:53 PM

## 2014-03-31 ENCOUNTER — Ambulatory Visit (HOSPITAL_COMMUNITY)
Admission: RE | Admit: 2014-03-31 | Discharge: 2014-03-31 | Disposition: A | Discharge status: Home / Self Care | Payer: Medicare Other | Source: Ambulatory Visit | Attending: Internal Medicine | Admitting: Internal Medicine

## 2014-03-31 DIAGNOSIS — IMO0001 Reserved for inherently not codable concepts without codable children: Secondary | ICD-10-CM | POA: Diagnosis not present

## 2014-03-31 NOTE — Progress Notes (Signed)
Physical Therapy Treatment Patient Details  Name: Laurie Hunter MRN: [id number] Date of Birth: Oct 18, 1945  Today's Date: 03/31/2014 Time: 2130-8657 PT Time Calculation (min): 45 min Visit#: 36 of 40  Authorization: [employer] medicare  Authorization Visit#: 36 of 40  Charges:  Manual [phone], therex 915-930  Subjective: Symptoms/Limitations Symptoms: PT states she gets her braces next week; went to [employer] Monday for final fitting.  States she continues to progress herself at home.   Exercise/Treatments Supine Heel Slides: 15 reps Bridges: 15 reps Straight Leg Raises: 15 reps Prone  Hamstring Curl: 15 reps Hip Extension: 10 reps Other Prone Exercises: terminal knee extension 10 reps each AAROM   Manual Therapy Edema Management: Manual lymph drainage completed for B LE including supraclavicular, deep and superfical abdominal, Routing fluid using inguinal-axillary anastomosis and LE both anteriorly and posteriorly  Physical Therapy Assessment and Plan PT Assessment and Plan Clinical Impression Statement: ACcording to last weeks measurements, Pt has lost additional 892.93 cc from Rt LE and 149.50 cc from Lt LE since 02/24/14.  PT continues to decompress, however not much change from 2 weeks prior.  Pt does require less assistance for basic mobility, however difficult due to immobility and heaviness of LE's.  Noted increasing ROM with therex as strength continues to increase.  Able to increase reps of therex today.  Added terminal knee extension in prone with AAROM.   PT Plan: Continue manual lymph drainage and therex progression.  Anticipate discharge in 4 more visits per evaluating therapist.  Add sideying hip abduction with AAROM next visit.  Problem List Patient Active Problem List   Diagnosis Date Noted  . Lymphedema of both lower extremities 12/17/2013  . Difficulty in walking(719.7) 12/17/2013      [name], PTA/CLT 03/31/2014, 10:30 AM

## 2014-04-02 ENCOUNTER — Ambulatory Visit (HOSPITAL_COMMUNITY)
Admission: RE | Admit: 2014-04-02 | Discharge: 2014-04-02 | Disposition: A | Discharge status: Home / Self Care | Payer: Medicare Other | Source: Ambulatory Visit | Attending: Internal Medicine | Admitting: Internal Medicine

## 2014-04-02 DIAGNOSIS — R262 Difficulty in walking, not elsewhere classified: Secondary | ICD-10-CM

## 2014-04-02 DIAGNOSIS — IMO0001 Reserved for inherently not codable concepts without codable children: Secondary | ICD-10-CM | POA: Diagnosis not present

## 2014-04-02 DIAGNOSIS — I890 Lymphedema, not elsewhere classified: Secondary | ICD-10-CM

## 2014-04-02 NOTE — Progress Notes (Signed)
Physical Therapy Treatment Patient Details  Name: FARIN BUHMAN MRN: [id number] Date of Birth: [birth year]  Today's Date: 04/02/2014 Time: 5643-3295 PT Time Calculation (min): 60 min  Visit#: 37 of 40  Re-eval: 04/09/14    Authorization: [employer] medicare  Authorization Time Period:    Authorization Visit#: 37 of 40   Subjective: Symptoms/Limitations Symptoms: Pt states that she recieved her compression pump yesterday;  she will begin using it this weekend     Exercise/Treatments     Supine Heel Slides: 15 reps Bridges: 15 reps Other Supine Knee Exercises: hip ab/adduction x 15     Prone  Hamstring Curl: 15 reps Hip Extension: 15 reps   Manual Therapy Edema Management:  Manual lymph drainage completed for B LE including supraclavicular, deep and superfical abdominal, Routing fluid using inguinal-axillary anastomosis and LE both anteriorly and posteriorly   midpatella Rt 72.5 was 72.2; Lt 82.0 was 78.0;  suprapatella Rt 76.8 was 79.0; Lt 86.0 was 87.5; 2' supra patella Rt 82.3 was 82.4; LT 88.0 was 86.8; 4" supra patella Rt 83.0 was 83.0 Lt. 87.0 was 88.7.  Physical Therapy Assessment and Plan PT Assessment and Plan Clinical Impression Statement: Pt continues to decongest.  PT now has pump as well as compression leggings.  Sister states she is willing to assist with manual to help pt.  Anticipate D/C next week for self maintenance. Pt unable to complete sidelying abduction continues with AA supine  PT Plan: Continue manual lymph drainage and therex progression.    Goals  progressing   Problem List Patient Active Problem List   Diagnosis Date Noted  . Lymphedema of both lower extremities 12/17/2013  . Difficulty in walking(719.7) 12/17/2013       GP    Esther Bradstreet,CINDY 04/02/2014, 3:12 PM

## 2014-04-05 ENCOUNTER — Ambulatory Visit (HOSPITAL_COMMUNITY)
Admission: RE | Admit: 2014-04-05 | Discharge: 2014-04-05 | Disposition: A | Discharge status: Home / Self Care | Payer: Medicare Other | Source: Ambulatory Visit | Attending: Internal Medicine | Admitting: Internal Medicine

## 2014-04-05 DIAGNOSIS — IMO0001 Reserved for inherently not codable concepts without codable children: Secondary | ICD-10-CM | POA: Diagnosis not present

## 2014-04-05 NOTE — Progress Notes (Signed)
Physical Therapy Treatment Patient Details  Name: Laurie Hunter MRN: [id number] Date of Birth: 1946-06-03  Today's Date: 04/05/2014 Time: 1430-1520 PT Time Calculation (min): 50 min Charge:  Manual 1430-1500; there ex 1500-1520 Visit#: 38 of 40  Re-eval: 04/09/14  :    Authorization Visit#: 38 of 40   Subjective: Symptoms/Limitations Symptoms: Pt states she has not started using the compression pump yet; states she plans on doing so this week.  Pt states that she is walking better than she has in a long time.      Exercise/Treatments   Seated Other Seated Knee Exercises: sit to stand x 10 Supine Heel Slides: 15 reps Bridges: 15 reps Straight Leg Raises: 15 reps Other Supine Knee Exercises: hip ab/adduction x 15    Prone  Hamstring Curl: 15 reps Hip Extension: 15 reps   Manual Therapy Edema Management: Manual lymph drainage completed for B LE including supraclavicular, deep and superfical abdominal, Routing fluid using inguinal-axillary anastomosis and LE both anteriorly and posteriorly  Physical Therapy Assessment and Plan PT Assessment and Plan Clinical Impression Statement: Pt able to demonstrate step through gait on a consistant basis.  Pt slowing with decongestion but will continue with pump at discharge.  PT Plan: discharge in two visits.     Goals    Problem List Patient Active Problem List   Diagnosis Date Noted  . Lymphedema of both lower extremities 12/17/2013  . Difficulty in walking(719.7) 12/17/2013       GP    Italo Banton,CINDY 04/05/2014, 4:00 PM

## 2014-04-07 ENCOUNTER — Ambulatory Visit (HOSPITAL_COMMUNITY)
Admission: RE | Admit: 2014-04-07 | Discharge: 2014-04-07 | Disposition: A | Discharge status: Home / Self Care | Payer: Medicare Other | Source: Ambulatory Visit | Attending: Internal Medicine | Admitting: Internal Medicine

## 2014-04-07 DIAGNOSIS — IMO0001 Reserved for inherently not codable concepts without codable children: Secondary | ICD-10-CM | POA: Diagnosis not present

## 2014-04-07 NOTE — Progress Notes (Signed)
Physical Therapy Treatment Patient Details  Name: Laurie Hunter MRN: [id number] Date of Birth: January 12, 1946  Today's Date: 04/07/2014 Time: 1420-1510 PT Time Calculation (min): 50 min  Visit#: 39 of 40  Re-eval: 04/09/14 Authorization Visit#: 39 of 40  Charges:  Manual 1420-1455 (35'), therex 1455-1510 (15')  Subjective: Symptoms/Limitations Symptoms: Pt states she is trying to get used to her new shoes.  States he used her existing braces for her shoes.  No complaints of pain. Pain Assessment Currently in Pain?: No/denies   Objective: Manual Therapy Edema Management: Manual lymph drainage completed for B LE including supraclavicular, deep and superfical abdominal, Routing fluid using inguinal-axillary anastomosis and LE both anteriorly and posteriorly  Physical Therapy Assessment and Plan PT Assessment and Plan Clinical Impression Statement: Focused on bed mobility and prone therex today as well as manual lymph drainage.  Pt continues to take increased time to complete basic bed mobility, however now able to transfer to prone position.   PT Plan: Re-eval with possible discharge next visit.     Problem List Patient Active Problem List   Diagnosis Date Noted  . Lymphedema of both lower extremities 12/17/2013  . Difficulty in walking(719.7) 12/17/2013        [name], PTA/CLT 04/07/2014, 5:06 PM

## 2014-04-13 ENCOUNTER — Ambulatory Visit (HOSPITAL_COMMUNITY): Payer: Medicare Other | Admitting: Physical Therapy

## 2014-04-15 ENCOUNTER — Ambulatory Visit (HOSPITAL_COMMUNITY): Payer: Medicare Other | Admitting: Physical Therapy

## 2014-04-19 ENCOUNTER — Inpatient Hospital Stay (HOSPITAL_COMMUNITY): Admission: RE | Admit: 2014-04-19 | Payer: Medicare Other | Source: Ambulatory Visit | Admitting: Physical Therapy

## 2014-04-21 ENCOUNTER — Ambulatory Visit (HOSPITAL_COMMUNITY)
Admission: RE | Admit: 2014-04-21 | Discharge: 2014-04-21 | Disposition: A | Discharge status: Home / Self Care | Payer: Medicare Other | Source: Ambulatory Visit | Attending: Internal Medicine | Admitting: Internal Medicine

## 2014-04-21 DIAGNOSIS — I890 Lymphedema, not elsewhere classified: Secondary | ICD-10-CM | POA: Diagnosis not present

## 2014-04-21 NOTE — Evaluation (Signed)
Physical Therapy Evaluation  Patient Details  Name: ANSLEY MANGIAPANE MRN: [id number] Date of Birth: 08/20/1945  Today's Date: 04/21/2014 Time: 0932-3557 PT Time Calculation (min): 50 min Charge:  Manual 1345-1418; there ex 3220-2542              Visit#: 38 of 35  Authorization: [employer] medicare       Authorization Visit#: 90 of 19   Past Medical History:  Past Medical History  Diagnosis Date  . Hypertension   . Diabetes mellitus without complication   . Stroke    Past Surgical History:  Past Surgical History  Procedure Laterality Date  . Gallbladder surgery    . Abdominal hysterectomy      Subjective Symptoms/Limitations Symptoms: Pt is using her pump every day.  Pt states she feels safer with her new orthotic.  Pt states she is going to church now.   How long can you sit comfortably?: no problem How long can you stand comfortably?: 10 minutes and then she wants to sit down was five minutes  How long can you walk comfortably?: 15 minutes was 10 minutes  Patient Stated Goals: able to stand and brush her teeth and face now was not able to do this, Able to get out of bed I was not able to do this, needs less assist going from sit to stand; toileting is easier, p      Assessment  last measurement= 04/02/2014  Midpatella Rt 69.2 was 72.5,  Lt 76 cm. Was 50;  2" suprapatella Rt 82 cm was 82.3; Rt 84.6 cm was 88 cm; 6" suprapatella Rt 82.5 cm was 83.0;  Lt 87.2 cm ws 87.0 cm   Exercise/Treatments    Seated Other Seated Knee Exercises: sit to stand x 10 Supine Heel Slides: 15 reps Bridges: 15 reps Straight Leg Raises: 15 reps Other Supine Knee Exercises: hip ab/adduction x 15     Prone  Hamstring Curl: 15 reps Hip Extension: 15 reps   Manual Therapy Edema Management:  Manual lymph drainage completed for B LE including supraclavicular, deep and superfical abdominal, Routing fluid using inguinal-axillary anastomosis and LE both anteriorly and posteriorly  Physical Therapy  Assessment and Plan PT Assessment and Plan Clinical Impression Statement: Pt has met all goals and is continuing to decongest with using pump at home.  Pt is now knowledgable of lymphedema and the importance of compression to control congestion.  Pt is now ambulating to church where she was non-ambulatory at inital evaluation.  Pt will be discharged to home program.   PT Plan: discharge.     Goals PT Short Term Goals Time to Complete Short Term Goals: 2 weeks PT Short Term Goal 1: Pt to be able to verbalize the importance of compression as a lifetime necessity to control edema PT Short Term Goal 1 - Progress: Met PT Short Term Goal 2: Pt to have obtained  [place] biking shorts PT Short Term Goal 2 - Progress: Met PT Short Term Goal 3: Pt volume to have decreased  by 20 % using cone formula  PT Short Term Goal 3 - Progress: Met PT Long Term Goals Time to Complete Long Term Goals: 4 weeks PT Long Term Goal 1: Pt to be able to verbalize the importance of skin care PT Long Term Goal 1 - Progress: Met PT Long Term Goal 2: Pt volume to be decreased by 50% Long Term Goal 3 Progress: Progressing toward goal Long Term Goal 4: NEW goal as of 03/12/2014- Pt  to be able to walk for 20 minutes Long Term Goal 4 Progress: Progressing toward goal  Problem List Patient Active Problem List   Diagnosis Date Noted  . Lymphedema of both lower extremities 12/17/2013  . Difficulty in walking(719.7) 12/17/2013       GP Functional Assessment Tool Used: clinical judgement Functional Limitation: Other PT primary Other PT Primary Goal Status (T7322): At least 60 percent but less than 80 percent impaired, limited or restricted Other PT Primary Discharge Status [phone]): At least 40 percent but less than 60 percent impaired, limited or restricted  Kimmy Parish,CINDY 04/21/2014, 4:41 PM  Physician Documentation Your signature is required to indicate approval of the treatment plan as stated above.  Please sign and  either send electronically or make a copy of this report for your files and return this physician signed original.   Please mark one 1.__approve of plan  2. ___approve of plan with the following conditions.   ______________________________                                                          _____________________ Physician Signature                                                                                                             Date

## 2015-12-06 ENCOUNTER — Other Ambulatory Visit: Payer: Self-pay | Admitting: Internal Medicine

## 2015-12-06 DIAGNOSIS — E0842 Diabetes mellitus due to underlying condition with diabetic polyneuropathy: Secondary | ICD-10-CM | POA: Diagnosis not present

## 2015-12-06 DIAGNOSIS — E782 Mixed hyperlipidemia: Secondary | ICD-10-CM | POA: Diagnosis not present

## 2015-12-06 DIAGNOSIS — I69359 Hemiplegia and hemiparesis following cerebral infarction affecting unspecified side: Secondary | ICD-10-CM | POA: Diagnosis not present

## 2015-12-06 DIAGNOSIS — M109 Gout, unspecified: Secondary | ICD-10-CM | POA: Diagnosis not present

## 2015-12-06 DIAGNOSIS — Z1389 Encounter for screening for other disorder: Secondary | ICD-10-CM | POA: Diagnosis not present

## 2015-12-06 DIAGNOSIS — E119 Type 2 diabetes mellitus without complications: Secondary | ICD-10-CM | POA: Diagnosis not present

## 2015-12-06 DIAGNOSIS — N63 Unspecified lump in unspecified breast: Secondary | ICD-10-CM

## 2015-12-06 DIAGNOSIS — E1122 Type 2 diabetes mellitus with diabetic chronic kidney disease: Secondary | ICD-10-CM | POA: Diagnosis not present

## 2015-12-06 DIAGNOSIS — Z0000 Encounter for general adult medical examination without abnormal findings: Secondary | ICD-10-CM | POA: Diagnosis not present

## 2015-12-06 DIAGNOSIS — I10 Essential (primary) hypertension: Secondary | ICD-10-CM | POA: Diagnosis not present

## 2015-12-06 DIAGNOSIS — D649 Anemia, unspecified: Secondary | ICD-10-CM | POA: Diagnosis not present

## 2015-12-06 DIAGNOSIS — N182 Chronic kidney disease, stage 2 (mild): Secondary | ICD-10-CM | POA: Diagnosis not present

## 2015-12-14 ENCOUNTER — Ambulatory Visit
Admission: RE | Admit: 2015-12-14 | Discharge: 2015-12-14 | Disposition: A | Discharge status: Home / Self Care | Payer: Medicare Other | Source: Ambulatory Visit | Attending: Internal Medicine | Admitting: Internal Medicine

## 2015-12-14 DIAGNOSIS — N63 Unspecified lump in unspecified breast: Secondary | ICD-10-CM

## 2015-12-26 ENCOUNTER — Telehealth (HOSPITAL_COMMUNITY): Payer: Self-pay | Admitting: Physical Therapy

## 2015-12-26 ENCOUNTER — Telehealth (HOSPITAL_COMMUNITY): Payer: Self-pay

## 2015-12-26 NOTE — Telephone Encounter (Signed)
Caregiver is sick and can't bring her.

## 2015-12-26 NOTE — Telephone Encounter (Signed)
her caregiver is in the hospital and that's her only transportation. Per WT /NF  Patient will call back to reschedule,.

## 2015-12-28 ENCOUNTER — Ambulatory Visit (HOSPITAL_COMMUNITY): Payer: Medicare Other | Admitting: Physical Therapy

## 2016-02-06 DIAGNOSIS — Z1211 Encounter for screening for malignant neoplasm of colon: Secondary | ICD-10-CM | POA: Diagnosis not present

## 2016-02-06 DIAGNOSIS — Z0000 Encounter for general adult medical examination without abnormal findings: Secondary | ICD-10-CM | POA: Diagnosis not present

## 2016-06-11 DIAGNOSIS — E1122 Type 2 diabetes mellitus with diabetic chronic kidney disease: Secondary | ICD-10-CM | POA: Diagnosis not present

## 2016-06-11 DIAGNOSIS — M109 Gout, unspecified: Secondary | ICD-10-CM | POA: Diagnosis not present

## 2016-06-11 DIAGNOSIS — I69359 Hemiplegia and hemiparesis following cerebral infarction affecting unspecified side: Secondary | ICD-10-CM | POA: Diagnosis not present

## 2016-06-11 DIAGNOSIS — E0842 Diabetes mellitus due to underlying condition with diabetic polyneuropathy: Secondary | ICD-10-CM | POA: Diagnosis not present

## 2016-06-11 DIAGNOSIS — E1140 Type 2 diabetes mellitus with diabetic neuropathy, unspecified: Secondary | ICD-10-CM | POA: Diagnosis not present

## 2016-06-11 DIAGNOSIS — N183 Chronic kidney disease, stage 3 (moderate): Secondary | ICD-10-CM | POA: Diagnosis not present

## 2016-06-11 DIAGNOSIS — I69354 Hemiplegia and hemiparesis following cerebral infarction affecting left non-dominant side: Secondary | ICD-10-CM | POA: Diagnosis not present

## 2016-06-11 DIAGNOSIS — E782 Mixed hyperlipidemia: Secondary | ICD-10-CM | POA: Diagnosis not present

## 2016-06-11 DIAGNOSIS — I10 Essential (primary) hypertension: Secondary | ICD-10-CM | POA: Diagnosis not present

## 2016-06-11 DIAGNOSIS — I890 Lymphedema, not elsewhere classified: Secondary | ICD-10-CM | POA: Diagnosis not present

## 2016-12-17 DIAGNOSIS — H5213 Myopia, bilateral: Secondary | ICD-10-CM | POA: Diagnosis not present

## 2016-12-18 DIAGNOSIS — E1122 Type 2 diabetes mellitus with diabetic chronic kidney disease: Secondary | ICD-10-CM | POA: Diagnosis not present

## 2016-12-18 DIAGNOSIS — M810 Age-related osteoporosis without current pathological fracture: Secondary | ICD-10-CM | POA: Diagnosis not present

## 2016-12-18 DIAGNOSIS — Z0000 Encounter for general adult medical examination without abnormal findings: Secondary | ICD-10-CM | POA: Diagnosis not present

## 2016-12-18 DIAGNOSIS — M109 Gout, unspecified: Secondary | ICD-10-CM | POA: Diagnosis not present

## 2016-12-18 DIAGNOSIS — I10 Essential (primary) hypertension: Secondary | ICD-10-CM | POA: Diagnosis not present

## 2017-03-04 DIAGNOSIS — M810 Age-related osteoporosis without current pathological fracture: Secondary | ICD-10-CM | POA: Diagnosis not present

## 2017-03-04 DIAGNOSIS — E782 Mixed hyperlipidemia: Secondary | ICD-10-CM | POA: Diagnosis not present

## 2017-03-04 DIAGNOSIS — D649 Anemia, unspecified: Secondary | ICD-10-CM | POA: Diagnosis not present

## 2017-03-04 DIAGNOSIS — I10 Essential (primary) hypertension: Secondary | ICD-10-CM | POA: Diagnosis not present

## 2017-03-21 DIAGNOSIS — M8588 Other specified disorders of bone density and structure, other site: Secondary | ICD-10-CM | POA: Diagnosis not present

## 2017-03-21 DIAGNOSIS — M810 Age-related osteoporosis without current pathological fracture: Secondary | ICD-10-CM | POA: Diagnosis not present

## 2017-04-04 DIAGNOSIS — D649 Anemia, unspecified: Secondary | ICD-10-CM | POA: Diagnosis not present

## 2017-04-04 DIAGNOSIS — E782 Mixed hyperlipidemia: Secondary | ICD-10-CM | POA: Diagnosis not present

## 2017-04-04 DIAGNOSIS — M810 Age-related osteoporosis without current pathological fracture: Secondary | ICD-10-CM | POA: Diagnosis not present

## 2017-04-04 DIAGNOSIS — I10 Essential (primary) hypertension: Secondary | ICD-10-CM | POA: Diagnosis not present

## 2017-06-19 DIAGNOSIS — I10 Essential (primary) hypertension: Secondary | ICD-10-CM | POA: Diagnosis not present

## 2017-06-19 DIAGNOSIS — M109 Gout, unspecified: Secondary | ICD-10-CM | POA: Diagnosis not present

## 2017-06-19 DIAGNOSIS — N183 Chronic kidney disease, stage 3 (moderate): Secondary | ICD-10-CM | POA: Diagnosis not present

## 2017-06-19 DIAGNOSIS — E1122 Type 2 diabetes mellitus with diabetic chronic kidney disease: Secondary | ICD-10-CM | POA: Diagnosis not present

## 2017-10-09 DIAGNOSIS — E1140 Type 2 diabetes mellitus with diabetic neuropathy, unspecified: Secondary | ICD-10-CM | POA: Diagnosis not present

## 2017-10-09 DIAGNOSIS — E1122 Type 2 diabetes mellitus with diabetic chronic kidney disease: Secondary | ICD-10-CM | POA: Diagnosis not present

## 2017-11-21 DIAGNOSIS — M21372 Foot drop, left foot: Secondary | ICD-10-CM | POA: Diagnosis not present

## 2017-12-26 ENCOUNTER — Other Ambulatory Visit: Payer: Self-pay | Admitting: Internal Medicine

## 2017-12-26 DIAGNOSIS — E1122 Type 2 diabetes mellitus with diabetic chronic kidney disease: Secondary | ICD-10-CM | POA: Diagnosis not present

## 2017-12-26 DIAGNOSIS — I10 Essential (primary) hypertension: Secondary | ICD-10-CM | POA: Diagnosis not present

## 2017-12-26 DIAGNOSIS — Z1211 Encounter for screening for malignant neoplasm of colon: Secondary | ICD-10-CM | POA: Diagnosis not present

## 2017-12-26 DIAGNOSIS — Z1159 Encounter for screening for other viral diseases: Secondary | ICD-10-CM | POA: Diagnosis not present

## 2017-12-26 DIAGNOSIS — Z1231 Encounter for screening mammogram for malignant neoplasm of breast: Secondary | ICD-10-CM

## 2017-12-26 DIAGNOSIS — M109 Gout, unspecified: Secondary | ICD-10-CM | POA: Diagnosis not present

## 2017-12-26 DIAGNOSIS — E119 Type 2 diabetes mellitus without complications: Secondary | ICD-10-CM | POA: Diagnosis not present

## 2017-12-26 DIAGNOSIS — E782 Mixed hyperlipidemia: Secondary | ICD-10-CM | POA: Diagnosis not present

## 2017-12-27 DIAGNOSIS — Z7984 Long term (current) use of oral hypoglycemic drugs: Secondary | ICD-10-CM | POA: Diagnosis not present

## 2017-12-27 DIAGNOSIS — E1122 Type 2 diabetes mellitus with diabetic chronic kidney disease: Secondary | ICD-10-CM | POA: Diagnosis not present

## 2018-01-15 ENCOUNTER — Ambulatory Visit: Payer: Medicare Other

## 2018-01-28 DIAGNOSIS — H5213 Myopia, bilateral: Secondary | ICD-10-CM | POA: Diagnosis not present

## 2018-01-28 DIAGNOSIS — E119 Type 2 diabetes mellitus without complications: Secondary | ICD-10-CM | POA: Diagnosis not present

## 2018-01-28 DIAGNOSIS — H2513 Age-related nuclear cataract, bilateral: Secondary | ICD-10-CM | POA: Diagnosis not present

## 2018-01-28 DIAGNOSIS — H52203 Unspecified astigmatism, bilateral: Secondary | ICD-10-CM | POA: Diagnosis not present

## 2018-01-31 ENCOUNTER — Ambulatory Visit
Admission: RE | Admit: 2018-01-31 | Discharge: 2018-01-31 | Disposition: A | Discharge status: Home / Self Care | Payer: Medicare Other | Source: Ambulatory Visit | Attending: Internal Medicine | Admitting: Internal Medicine

## 2018-01-31 DIAGNOSIS — Z1231 Encounter for screening mammogram for malignant neoplasm of breast: Secondary | ICD-10-CM

## 2018-06-30 DIAGNOSIS — E1122 Type 2 diabetes mellitus with diabetic chronic kidney disease: Secondary | ICD-10-CM | POA: Diagnosis not present

## 2018-06-30 DIAGNOSIS — E1140 Type 2 diabetes mellitus with diabetic neuropathy, unspecified: Secondary | ICD-10-CM | POA: Diagnosis not present

## 2018-06-30 DIAGNOSIS — I69359 Hemiplegia and hemiparesis following cerebral infarction affecting unspecified side: Secondary | ICD-10-CM | POA: Diagnosis not present

## 2018-06-30 DIAGNOSIS — I10 Essential (primary) hypertension: Secondary | ICD-10-CM | POA: Diagnosis not present

## 2018-11-14 DIAGNOSIS — E1122 Type 2 diabetes mellitus with diabetic chronic kidney disease: Secondary | ICD-10-CM | POA: Diagnosis not present

## 2018-12-10 DIAGNOSIS — N183 Chronic kidney disease, stage 3 (moderate): Secondary | ICD-10-CM | POA: Diagnosis not present

## 2018-12-10 DIAGNOSIS — E119 Type 2 diabetes mellitus without complications: Secondary | ICD-10-CM | POA: Diagnosis not present

## 2018-12-10 DIAGNOSIS — E782 Mixed hyperlipidemia: Secondary | ICD-10-CM | POA: Diagnosis not present

## 2018-12-10 DIAGNOSIS — I10 Essential (primary) hypertension: Secondary | ICD-10-CM | POA: Diagnosis not present

## 2019-01-12 DIAGNOSIS — E119 Type 2 diabetes mellitus without complications: Secondary | ICD-10-CM | POA: Diagnosis not present

## 2019-01-12 DIAGNOSIS — I10 Essential (primary) hypertension: Secondary | ICD-10-CM | POA: Diagnosis not present

## 2019-01-12 DIAGNOSIS — E782 Mixed hyperlipidemia: Secondary | ICD-10-CM | POA: Diagnosis not present

## 2019-01-12 DIAGNOSIS — N183 Chronic kidney disease, stage 3 (moderate): Secondary | ICD-10-CM | POA: Diagnosis not present

## 2019-01-21 DIAGNOSIS — E1122 Type 2 diabetes mellitus with diabetic chronic kidney disease: Secondary | ICD-10-CM | POA: Diagnosis not present

## 2019-01-21 DIAGNOSIS — Z0000 Encounter for general adult medical examination without abnormal findings: Secondary | ICD-10-CM | POA: Diagnosis not present

## 2019-01-21 DIAGNOSIS — I10 Essential (primary) hypertension: Secondary | ICD-10-CM | POA: Diagnosis not present

## 2019-01-21 DIAGNOSIS — Z1389 Encounter for screening for other disorder: Secondary | ICD-10-CM | POA: Diagnosis not present

## 2019-01-28 DIAGNOSIS — Z7984 Long term (current) use of oral hypoglycemic drugs: Secondary | ICD-10-CM | POA: Diagnosis not present

## 2019-01-28 DIAGNOSIS — I10 Essential (primary) hypertension: Secondary | ICD-10-CM | POA: Diagnosis not present

## 2019-01-28 DIAGNOSIS — E1122 Type 2 diabetes mellitus with diabetic chronic kidney disease: Secondary | ICD-10-CM | POA: Diagnosis not present

## 2019-01-28 DIAGNOSIS — E782 Mixed hyperlipidemia: Secondary | ICD-10-CM | POA: Diagnosis not present

## 2019-02-11 DIAGNOSIS — M810 Age-related osteoporosis without current pathological fracture: Secondary | ICD-10-CM | POA: Diagnosis not present

## 2019-02-11 DIAGNOSIS — D649 Anemia, unspecified: Secondary | ICD-10-CM | POA: Diagnosis not present

## 2019-02-11 DIAGNOSIS — I10 Essential (primary) hypertension: Secondary | ICD-10-CM | POA: Diagnosis not present

## 2019-02-11 DIAGNOSIS — E782 Mixed hyperlipidemia: Secondary | ICD-10-CM | POA: Diagnosis not present

## 2019-02-19 DIAGNOSIS — M21372 Foot drop, left foot: Secondary | ICD-10-CM | POA: Diagnosis not present

## 2019-02-23 DIAGNOSIS — E119 Type 2 diabetes mellitus without complications: Secondary | ICD-10-CM | POA: Diagnosis not present

## 2019-02-23 DIAGNOSIS — H25813 Combined forms of age-related cataract, bilateral: Secondary | ICD-10-CM | POA: Diagnosis not present

## 2019-02-23 DIAGNOSIS — H52203 Unspecified astigmatism, bilateral: Secondary | ICD-10-CM | POA: Diagnosis not present

## 2019-02-23 DIAGNOSIS — H5213 Myopia, bilateral: Secondary | ICD-10-CM | POA: Diagnosis not present

## 2019-03-11 DIAGNOSIS — I10 Essential (primary) hypertension: Secondary | ICD-10-CM | POA: Diagnosis not present

## 2019-03-11 DIAGNOSIS — E782 Mixed hyperlipidemia: Secondary | ICD-10-CM | POA: Diagnosis not present

## 2019-03-11 DIAGNOSIS — M810 Age-related osteoporosis without current pathological fracture: Secondary | ICD-10-CM | POA: Diagnosis not present

## 2019-03-11 DIAGNOSIS — D649 Anemia, unspecified: Secondary | ICD-10-CM | POA: Diagnosis not present

## 2019-03-12 DIAGNOSIS — Z6838 Body mass index (BMI) 38.0-38.9, adult: Secondary | ICD-10-CM | POA: Diagnosis not present

## 2019-03-12 DIAGNOSIS — Z9181 History of falling: Secondary | ICD-10-CM | POA: Diagnosis not present

## 2019-03-12 DIAGNOSIS — D631 Anemia in chronic kidney disease: Secondary | ICD-10-CM | POA: Diagnosis not present

## 2019-03-12 DIAGNOSIS — I890 Lymphedema, not elsewhere classified: Secondary | ICD-10-CM | POA: Diagnosis not present

## 2019-03-12 DIAGNOSIS — N183 Chronic kidney disease, stage 3 (moderate): Secondary | ICD-10-CM | POA: Diagnosis not present

## 2019-03-12 DIAGNOSIS — E1140 Type 2 diabetes mellitus with diabetic neuropathy, unspecified: Secondary | ICD-10-CM | POA: Diagnosis not present

## 2019-03-12 DIAGNOSIS — I69354 Hemiplegia and hemiparesis following cerebral infarction affecting left non-dominant side: Secondary | ICD-10-CM | POA: Diagnosis not present

## 2019-03-12 DIAGNOSIS — M810 Age-related osteoporosis without current pathological fracture: Secondary | ICD-10-CM | POA: Diagnosis not present

## 2019-03-12 DIAGNOSIS — M1990 Unspecified osteoarthritis, unspecified site: Secondary | ICD-10-CM | POA: Diagnosis not present

## 2019-03-12 DIAGNOSIS — M109 Gout, unspecified: Secondary | ICD-10-CM | POA: Diagnosis not present

## 2019-03-12 DIAGNOSIS — I10 Essential (primary) hypertension: Secondary | ICD-10-CM | POA: Diagnosis not present

## 2019-03-12 DIAGNOSIS — R32 Unspecified urinary incontinence: Secondary | ICD-10-CM | POA: Diagnosis not present

## 2019-03-12 DIAGNOSIS — E782 Mixed hyperlipidemia: Secondary | ICD-10-CM | POA: Diagnosis not present

## 2019-03-12 DIAGNOSIS — E1122 Type 2 diabetes mellitus with diabetic chronic kidney disease: Secondary | ICD-10-CM | POA: Diagnosis not present

## 2019-03-12 DIAGNOSIS — Z7984 Long term (current) use of oral hypoglycemic drugs: Secondary | ICD-10-CM | POA: Diagnosis not present

## 2019-04-07 DIAGNOSIS — R2681 Unsteadiness on feet: Secondary | ICD-10-CM | POA: Diagnosis not present

## 2019-04-07 DIAGNOSIS — R2689 Other abnormalities of gait and mobility: Secondary | ICD-10-CM | POA: Diagnosis not present

## 2019-04-07 DIAGNOSIS — M6281 Muscle weakness (generalized): Secondary | ICD-10-CM | POA: Diagnosis not present

## 2019-04-09 DIAGNOSIS — I10 Essential (primary) hypertension: Secondary | ICD-10-CM | POA: Diagnosis not present

## 2019-04-09 DIAGNOSIS — E782 Mixed hyperlipidemia: Secondary | ICD-10-CM | POA: Diagnosis not present

## 2019-04-09 DIAGNOSIS — M810 Age-related osteoporosis without current pathological fracture: Secondary | ICD-10-CM | POA: Diagnosis not present

## 2019-04-09 DIAGNOSIS — D649 Anemia, unspecified: Secondary | ICD-10-CM | POA: Diagnosis not present

## 2019-04-13 DIAGNOSIS — I890 Lymphedema, not elsewhere classified: Secondary | ICD-10-CM | POA: Diagnosis not present

## 2019-04-13 DIAGNOSIS — R32 Unspecified urinary incontinence: Secondary | ICD-10-CM | POA: Diagnosis not present

## 2019-04-13 DIAGNOSIS — D631 Anemia in chronic kidney disease: Secondary | ICD-10-CM | POA: Diagnosis not present

## 2019-04-13 DIAGNOSIS — Z7984 Long term (current) use of oral hypoglycemic drugs: Secondary | ICD-10-CM | POA: Diagnosis not present

## 2019-04-13 DIAGNOSIS — E1140 Type 2 diabetes mellitus with diabetic neuropathy, unspecified: Secondary | ICD-10-CM | POA: Diagnosis not present

## 2019-04-13 DIAGNOSIS — I10 Essential (primary) hypertension: Secondary | ICD-10-CM | POA: Diagnosis not present

## 2019-04-13 DIAGNOSIS — Z6838 Body mass index (BMI) 38.0-38.9, adult: Secondary | ICD-10-CM | POA: Diagnosis not present

## 2019-04-13 DIAGNOSIS — E1122 Type 2 diabetes mellitus with diabetic chronic kidney disease: Secondary | ICD-10-CM | POA: Diagnosis not present

## 2019-04-13 DIAGNOSIS — M810 Age-related osteoporosis without current pathological fracture: Secondary | ICD-10-CM | POA: Diagnosis not present

## 2019-04-13 DIAGNOSIS — M109 Gout, unspecified: Secondary | ICD-10-CM | POA: Diagnosis not present

## 2019-04-13 DIAGNOSIS — E782 Mixed hyperlipidemia: Secondary | ICD-10-CM | POA: Diagnosis not present

## 2019-04-13 DIAGNOSIS — I69354 Hemiplegia and hemiparesis following cerebral infarction affecting left non-dominant side: Secondary | ICD-10-CM | POA: Diagnosis not present

## 2019-04-13 DIAGNOSIS — N183 Chronic kidney disease, stage 3 (moderate): Secondary | ICD-10-CM | POA: Diagnosis not present

## 2019-04-13 DIAGNOSIS — M1990 Unspecified osteoarthritis, unspecified site: Secondary | ICD-10-CM | POA: Diagnosis not present

## 2019-04-13 DIAGNOSIS — Z9181 History of falling: Secondary | ICD-10-CM | POA: Diagnosis not present

## 2019-05-12 DIAGNOSIS — M810 Age-related osteoporosis without current pathological fracture: Secondary | ICD-10-CM | POA: Diagnosis not present

## 2019-05-12 DIAGNOSIS — I10 Essential (primary) hypertension: Secondary | ICD-10-CM | POA: Diagnosis not present

## 2019-05-12 DIAGNOSIS — E782 Mixed hyperlipidemia: Secondary | ICD-10-CM | POA: Diagnosis not present

## 2019-05-12 DIAGNOSIS — D649 Anemia, unspecified: Secondary | ICD-10-CM | POA: Diagnosis not present

## 2019-05-26 DIAGNOSIS — E782 Mixed hyperlipidemia: Secondary | ICD-10-CM | POA: Diagnosis not present

## 2019-05-26 DIAGNOSIS — E1122 Type 2 diabetes mellitus with diabetic chronic kidney disease: Secondary | ICD-10-CM | POA: Diagnosis not present

## 2019-05-26 DIAGNOSIS — I10 Essential (primary) hypertension: Secondary | ICD-10-CM | POA: Diagnosis not present

## 2019-05-26 DIAGNOSIS — I69359 Hemiplegia and hemiparesis following cerebral infarction affecting unspecified side: Secondary | ICD-10-CM | POA: Diagnosis not present

## 2019-06-15 DIAGNOSIS — I10 Essential (primary) hypertension: Secondary | ICD-10-CM | POA: Diagnosis not present

## 2019-06-15 DIAGNOSIS — D649 Anemia, unspecified: Secondary | ICD-10-CM | POA: Diagnosis not present

## 2019-06-15 DIAGNOSIS — E782 Mixed hyperlipidemia: Secondary | ICD-10-CM | POA: Diagnosis not present

## 2019-06-15 DIAGNOSIS — M810 Age-related osteoporosis without current pathological fracture: Secondary | ICD-10-CM | POA: Diagnosis not present

## 2019-06-17 DIAGNOSIS — I890 Lymphedema, not elsewhere classified: Secondary | ICD-10-CM | POA: Diagnosis not present

## 2019-06-17 DIAGNOSIS — I69354 Hemiplegia and hemiparesis following cerebral infarction affecting left non-dominant side: Secondary | ICD-10-CM | POA: Diagnosis not present

## 2019-07-01 DIAGNOSIS — M810 Age-related osteoporosis without current pathological fracture: Secondary | ICD-10-CM | POA: Diagnosis not present

## 2019-07-01 DIAGNOSIS — I10 Essential (primary) hypertension: Secondary | ICD-10-CM | POA: Diagnosis not present

## 2019-07-01 DIAGNOSIS — E782 Mixed hyperlipidemia: Secondary | ICD-10-CM | POA: Diagnosis not present

## 2019-07-01 DIAGNOSIS — D649 Anemia, unspecified: Secondary | ICD-10-CM | POA: Diagnosis not present

## 2019-07-07 DIAGNOSIS — E1122 Type 2 diabetes mellitus with diabetic chronic kidney disease: Secondary | ICD-10-CM | POA: Diagnosis not present

## 2019-07-30 DIAGNOSIS — M810 Age-related osteoporosis without current pathological fracture: Secondary | ICD-10-CM | POA: Diagnosis not present

## 2019-07-30 DIAGNOSIS — I10 Essential (primary) hypertension: Secondary | ICD-10-CM | POA: Diagnosis not present

## 2019-07-30 DIAGNOSIS — D649 Anemia, unspecified: Secondary | ICD-10-CM | POA: Diagnosis not present

## 2019-07-30 DIAGNOSIS — E782 Mixed hyperlipidemia: Secondary | ICD-10-CM | POA: Diagnosis not present

## 2019-09-08 DIAGNOSIS — I10 Essential (primary) hypertension: Secondary | ICD-10-CM | POA: Diagnosis not present

## 2019-09-08 DIAGNOSIS — D649 Anemia, unspecified: Secondary | ICD-10-CM | POA: Diagnosis not present

## 2019-09-08 DIAGNOSIS — M810 Age-related osteoporosis without current pathological fracture: Secondary | ICD-10-CM | POA: Diagnosis not present

## 2019-09-08 DIAGNOSIS — E782 Mixed hyperlipidemia: Secondary | ICD-10-CM | POA: Diagnosis not present

## 2019-10-05 DIAGNOSIS — E782 Mixed hyperlipidemia: Secondary | ICD-10-CM | POA: Diagnosis not present

## 2019-10-05 DIAGNOSIS — I10 Essential (primary) hypertension: Secondary | ICD-10-CM | POA: Diagnosis not present

## 2019-10-05 DIAGNOSIS — M810 Age-related osteoporosis without current pathological fracture: Secondary | ICD-10-CM | POA: Diagnosis not present

## 2019-10-05 DIAGNOSIS — D649 Anemia, unspecified: Secondary | ICD-10-CM | POA: Diagnosis not present

## 2019-10-20 ENCOUNTER — Other Ambulatory Visit: Payer: Self-pay | Admitting: Internal Medicine

## 2019-10-20 DIAGNOSIS — Z1231 Encounter for screening mammogram for malignant neoplasm of breast: Secondary | ICD-10-CM

## 2019-11-03 DIAGNOSIS — E782 Mixed hyperlipidemia: Secondary | ICD-10-CM | POA: Diagnosis not present

## 2019-11-03 DIAGNOSIS — I10 Essential (primary) hypertension: Secondary | ICD-10-CM | POA: Diagnosis not present

## 2019-11-03 DIAGNOSIS — D649 Anemia, unspecified: Secondary | ICD-10-CM | POA: Diagnosis not present

## 2019-11-03 DIAGNOSIS — M810 Age-related osteoporosis without current pathological fracture: Secondary | ICD-10-CM | POA: Diagnosis not present

## 2019-11-04 ENCOUNTER — Ambulatory Visit
Admission: RE | Admit: 2019-11-04 | Discharge: 2019-11-04 | Disposition: A | Discharge status: Home / Self Care | Payer: Medicare Other | Source: Ambulatory Visit | Attending: Internal Medicine | Admitting: Internal Medicine

## 2019-11-04 ENCOUNTER — Other Ambulatory Visit: Payer: Self-pay

## 2019-11-04 DIAGNOSIS — Z1231 Encounter for screening mammogram for malignant neoplasm of breast: Secondary | ICD-10-CM | POA: Diagnosis not present

## 2019-12-01 DIAGNOSIS — M810 Age-related osteoporosis without current pathological fracture: Secondary | ICD-10-CM | POA: Diagnosis not present

## 2019-12-01 DIAGNOSIS — E782 Mixed hyperlipidemia: Secondary | ICD-10-CM | POA: Diagnosis not present

## 2019-12-01 DIAGNOSIS — D649 Anemia, unspecified: Secondary | ICD-10-CM | POA: Diagnosis not present

## 2019-12-01 DIAGNOSIS — I10 Essential (primary) hypertension: Secondary | ICD-10-CM | POA: Diagnosis not present

## 2020-01-01 DIAGNOSIS — I10 Essential (primary) hypertension: Secondary | ICD-10-CM | POA: Diagnosis not present

## 2020-01-01 DIAGNOSIS — E1122 Type 2 diabetes mellitus with diabetic chronic kidney disease: Secondary | ICD-10-CM | POA: Diagnosis not present

## 2020-01-01 DIAGNOSIS — N182 Chronic kidney disease, stage 2 (mild): Secondary | ICD-10-CM | POA: Diagnosis not present

## 2020-01-01 DIAGNOSIS — D649 Anemia, unspecified: Secondary | ICD-10-CM | POA: Diagnosis not present

## 2020-01-27 DIAGNOSIS — E1122 Type 2 diabetes mellitus with diabetic chronic kidney disease: Secondary | ICD-10-CM | POA: Diagnosis not present

## 2020-01-27 DIAGNOSIS — N182 Chronic kidney disease, stage 2 (mild): Secondary | ICD-10-CM | POA: Diagnosis not present

## 2020-01-27 DIAGNOSIS — D649 Anemia, unspecified: Secondary | ICD-10-CM | POA: Diagnosis not present

## 2020-01-27 DIAGNOSIS — I10 Essential (primary) hypertension: Secondary | ICD-10-CM | POA: Diagnosis not present

## 2020-02-16 DIAGNOSIS — E1122 Type 2 diabetes mellitus with diabetic chronic kidney disease: Secondary | ICD-10-CM | POA: Diagnosis not present

## 2020-02-16 DIAGNOSIS — I10 Essential (primary) hypertension: Secondary | ICD-10-CM | POA: Diagnosis not present

## 2020-02-16 DIAGNOSIS — Z0000 Encounter for general adult medical examination without abnormal findings: Secondary | ICD-10-CM | POA: Diagnosis not present

## 2020-02-16 DIAGNOSIS — M109 Gout, unspecified: Secondary | ICD-10-CM | POA: Diagnosis not present

## 2020-03-03 ENCOUNTER — Ambulatory Visit: Payer: Medicare Other | Admitting: Sports Medicine

## 2020-03-08 DIAGNOSIS — I10 Essential (primary) hypertension: Secondary | ICD-10-CM | POA: Diagnosis not present

## 2020-03-08 DIAGNOSIS — D649 Anemia, unspecified: Secondary | ICD-10-CM | POA: Diagnosis not present

## 2020-03-08 DIAGNOSIS — E1122 Type 2 diabetes mellitus with diabetic chronic kidney disease: Secondary | ICD-10-CM | POA: Diagnosis not present

## 2020-03-08 DIAGNOSIS — N182 Chronic kidney disease, stage 2 (mild): Secondary | ICD-10-CM | POA: Diagnosis not present

## 2020-03-10 ENCOUNTER — Ambulatory Visit: Payer: Medicare Other | Admitting: Sports Medicine

## 2020-03-17 ENCOUNTER — Other Ambulatory Visit: Payer: Self-pay

## 2020-03-17 ENCOUNTER — Ambulatory Visit (INDEPENDENT_AMBULATORY_CARE_PROVIDER_SITE_OTHER): Payer: Medicare Other | Admitting: Sports Medicine

## 2020-03-17 ENCOUNTER — Encounter: Payer: Self-pay | Admitting: Sports Medicine

## 2020-03-17 DIAGNOSIS — E1142 Type 2 diabetes mellitus with diabetic polyneuropathy: Secondary | ICD-10-CM | POA: Diagnosis not present

## 2020-03-17 DIAGNOSIS — I890 Lymphedema, not elsewhere classified: Secondary | ICD-10-CM

## 2020-03-17 DIAGNOSIS — M79675 Pain in left toe(s): Secondary | ICD-10-CM | POA: Diagnosis not present

## 2020-03-17 DIAGNOSIS — Z8673 Personal history of transient ischemic attack (TIA), and cerebral infarction without residual deficits: Secondary | ICD-10-CM

## 2020-03-17 DIAGNOSIS — B351 Tinea unguium: Secondary | ICD-10-CM

## 2020-03-17 DIAGNOSIS — M79674 Pain in right toe(s): Secondary | ICD-10-CM

## 2020-03-17 DIAGNOSIS — R531 Weakness: Secondary | ICD-10-CM

## 2020-03-17 NOTE — Progress Notes (Signed)
Subjective: Laurie Hunter is a 74 y.o. female patient with history of diabetes who presents to office today complaining of long,mildly painful nails  while ambulating in shoes; unable to trim. Patient states that the glucose reading this morning was not recorded but last A1c was good.  Patient is concerned about severe thickness at the right hallux nail.  Patient relates to a history of a stroke affecting the left side currently uses a custom shoe with brace.  Patient denies any significant pain to deformed right hallux toenail.  ROS noncontributory  Patient Active Problem List   Diagnosis Date Noted  . Lymphedema of both lower extremities 12/17/2013  . Difficulty in walking(719.7) 12/17/2013   Current Outpatient Medications on File Prior to Visit  Medication Sig Dispense Refill  . alendronate (FOSAMAX) 70 MG tablet Take 70 mg by mouth every 7 (seven) days. Friday. Take with a full glass of water on an empty stomach.    Marland Kitchen allopurinol (ZYLOPRIM) 100 MG tablet Take 100 mg by mouth daily.    Marland Kitchen amLODipine (NORVASC) 10 MG tablet Take 10 mg by mouth daily.    Marland Kitchen aspirin 81 MG tablet Take 162 mg by mouth daily.    Marland Kitchen atenolol (TENORMIN) 50 MG tablet Take 25 mg by mouth daily.    Marland Kitchen atorvastatin (LIPITOR) 20 MG tablet Take 20 mg by mouth at bedtime.    . clopidogrel (PLAVIX) 75 MG tablet Take 75 mg by mouth daily.    . famotidine (PEPCID) 20 MG tablet Take 1 tablet (20 mg total) by mouth 2 (two) times daily. 30 tablet 0  . FEROSUL 325 (65 Fe) MG tablet SMARTSIG:1 Tablet(s) By Mouth Every Evening    . glipiZIDE (GLUCOTROL) 5 MG tablet Take 5 mg by mouth daily.    . hydrochlorothiazide (HYDRODIURIL) 25 MG tablet Take 25 mg by mouth daily.    Marland Kitchen losartan (COZAAR) 25 MG tablet Take 25 mg by mouth every morning.    . metFORMIN (GLUCOPHAGE) 500 MG tablet Take 500 mg by mouth 2 (two) times daily with a meal.    . potassium chloride (KLOR-CON) 10 MEQ tablet Take 10 mEq by mouth every morning.    . pravastatin  (PRAVACHOL) 40 MG tablet Take 40 mg by mouth daily.    . predniSONE (DELTASONE) 20 MG tablet Take 2 tablets (40 mg total) by mouth daily. 10 tablet 0   No current facility-administered medications on file prior to visit.   Allergies  Allergen Reactions  . Erythromycin Swelling  . Lisinopril Swelling  . Penicillins Swelling    No results found for this or any previous visit (from the past 2160 hour(s)).  Objective: General: Patient is awake, alert, and oriented x 3 and in no acute distress.  Integument: Skin is warm, dry and supple bilateral. Nails are tender, long, thickened and  dystrophic with subungual debris, consistent with onychomycosis, 1-5 bilateral with right great hallux toenail most involved. No signs of infection. No open lesions or preulcerative lesions present bilateral. Remaining integument unremarkable.   Vasculature:  Dorsalis Pedis pulse 1/4 bilateral. Posterior Tibial pulse  1/4 bilateral.  Capillary fill time <3 sec 1-5 bilateral. No hair growth to the level of the digits. Chronic edema to legs.   Neurology: Vibratory diminished bilateral. History of stroke and left sided weakness.    Musculoskeletal: Digital deformity and Left foot drop.   Assessment and Plan: Problem List Items Addressed This Visit      Other   Lymphedema of both  lower extremities    Other Visit Diagnoses    Pain due to onychomycosis of toenails of both feet    -  Primary   Diabetic polyneuropathy associated with type 2 diabetes mellitus (HCC)       Relevant Medications   losartan (COZAAR) 25 MG tablet   atorvastatin (LIPITOR) 20 MG tablet   History of stroke       Left-sided weakness          -Examined patient. -Mechanically debrided all nails 1-5 bilateral using sterile nail nipper and filed with dremel without incident  -Answered all patient questions -Patient to return  in 3 months for at risk foot care -Patient advised to call the office if any problems or questions arise in  the meantime.  Asencion Islam, DPM

## 2020-04-04 DIAGNOSIS — H5213 Myopia, bilateral: Secondary | ICD-10-CM | POA: Diagnosis not present

## 2020-04-04 DIAGNOSIS — H25813 Combined forms of age-related cataract, bilateral: Secondary | ICD-10-CM | POA: Diagnosis not present

## 2020-04-04 DIAGNOSIS — H52203 Unspecified astigmatism, bilateral: Secondary | ICD-10-CM | POA: Diagnosis not present

## 2020-04-04 DIAGNOSIS — E119 Type 2 diabetes mellitus without complications: Secondary | ICD-10-CM | POA: Diagnosis not present

## 2020-04-06 DIAGNOSIS — I10 Essential (primary) hypertension: Secondary | ICD-10-CM | POA: Diagnosis not present

## 2020-04-06 DIAGNOSIS — D649 Anemia, unspecified: Secondary | ICD-10-CM | POA: Diagnosis not present

## 2020-04-06 DIAGNOSIS — E1122 Type 2 diabetes mellitus with diabetic chronic kidney disease: Secondary | ICD-10-CM | POA: Diagnosis not present

## 2020-04-06 DIAGNOSIS — N182 Chronic kidney disease, stage 2 (mild): Secondary | ICD-10-CM | POA: Diagnosis not present

## 2020-05-04 DIAGNOSIS — E1122 Type 2 diabetes mellitus with diabetic chronic kidney disease: Secondary | ICD-10-CM | POA: Diagnosis not present

## 2020-05-04 DIAGNOSIS — I10 Essential (primary) hypertension: Secondary | ICD-10-CM | POA: Diagnosis not present

## 2020-05-04 DIAGNOSIS — N182 Chronic kidney disease, stage 2 (mild): Secondary | ICD-10-CM | POA: Diagnosis not present

## 2020-05-04 DIAGNOSIS — D649 Anemia, unspecified: Secondary | ICD-10-CM | POA: Diagnosis not present

## 2020-06-03 DIAGNOSIS — E1122 Type 2 diabetes mellitus with diabetic chronic kidney disease: Secondary | ICD-10-CM | POA: Diagnosis not present

## 2020-06-03 DIAGNOSIS — I10 Essential (primary) hypertension: Secondary | ICD-10-CM | POA: Diagnosis not present

## 2020-06-03 DIAGNOSIS — D649 Anemia, unspecified: Secondary | ICD-10-CM | POA: Diagnosis not present

## 2020-06-03 DIAGNOSIS — N182 Chronic kidney disease, stage 2 (mild): Secondary | ICD-10-CM | POA: Diagnosis not present

## 2020-06-16 DIAGNOSIS — R0602 Shortness of breath: Secondary | ICD-10-CM | POA: Diagnosis not present

## 2020-06-16 DIAGNOSIS — R5383 Other fatigue: Secondary | ICD-10-CM | POA: Diagnosis not present

## 2020-06-16 DIAGNOSIS — S71112A Laceration without foreign body, left thigh, initial encounter: Secondary | ICD-10-CM | POA: Diagnosis not present

## 2020-06-16 DIAGNOSIS — Z23 Encounter for immunization: Secondary | ICD-10-CM | POA: Diagnosis not present

## 2020-06-20 DIAGNOSIS — E1122 Type 2 diabetes mellitus with diabetic chronic kidney disease: Secondary | ICD-10-CM | POA: Diagnosis not present

## 2020-06-20 DIAGNOSIS — E782 Mixed hyperlipidemia: Secondary | ICD-10-CM | POA: Diagnosis not present

## 2020-06-20 DIAGNOSIS — I69359 Hemiplegia and hemiparesis following cerebral infarction affecting unspecified side: Secondary | ICD-10-CM | POA: Diagnosis not present

## 2020-06-20 DIAGNOSIS — I10 Essential (primary) hypertension: Secondary | ICD-10-CM | POA: Diagnosis not present

## 2020-06-21 ENCOUNTER — Ambulatory Visit: Payer: Medicare Other | Admitting: Podiatry

## 2020-06-30 DIAGNOSIS — E1122 Type 2 diabetes mellitus with diabetic chronic kidney disease: Secondary | ICD-10-CM | POA: Diagnosis not present

## 2020-06-30 DIAGNOSIS — I10 Essential (primary) hypertension: Secondary | ICD-10-CM | POA: Diagnosis not present

## 2020-06-30 DIAGNOSIS — N182 Chronic kidney disease, stage 2 (mild): Secondary | ICD-10-CM | POA: Diagnosis not present

## 2020-06-30 DIAGNOSIS — D649 Anemia, unspecified: Secondary | ICD-10-CM | POA: Diagnosis not present

## 2020-07-28 DIAGNOSIS — E1122 Type 2 diabetes mellitus with diabetic chronic kidney disease: Secondary | ICD-10-CM | POA: Diagnosis not present

## 2020-07-28 DIAGNOSIS — I10 Essential (primary) hypertension: Secondary | ICD-10-CM | POA: Diagnosis not present

## 2020-07-28 DIAGNOSIS — N182 Chronic kidney disease, stage 2 (mild): Secondary | ICD-10-CM | POA: Diagnosis not present

## 2020-07-28 DIAGNOSIS — D649 Anemia, unspecified: Secondary | ICD-10-CM | POA: Diagnosis not present

## 2020-09-01 DIAGNOSIS — D649 Anemia, unspecified: Secondary | ICD-10-CM | POA: Diagnosis not present

## 2020-09-01 DIAGNOSIS — I10 Essential (primary) hypertension: Secondary | ICD-10-CM | POA: Diagnosis not present

## 2020-09-01 DIAGNOSIS — E1122 Type 2 diabetes mellitus with diabetic chronic kidney disease: Secondary | ICD-10-CM | POA: Diagnosis not present

## 2020-09-01 DIAGNOSIS — N182 Chronic kidney disease, stage 2 (mild): Secondary | ICD-10-CM | POA: Diagnosis not present

## 2020-09-27 DIAGNOSIS — E1122 Type 2 diabetes mellitus with diabetic chronic kidney disease: Secondary | ICD-10-CM | POA: Diagnosis not present

## 2020-09-27 DIAGNOSIS — E782 Mixed hyperlipidemia: Secondary | ICD-10-CM | POA: Diagnosis not present

## 2020-09-27 DIAGNOSIS — I10 Essential (primary) hypertension: Secondary | ICD-10-CM | POA: Diagnosis not present

## 2020-09-27 DIAGNOSIS — N183 Chronic kidney disease, stage 3 unspecified: Secondary | ICD-10-CM | POA: Diagnosis not present

## 2020-10-28 DIAGNOSIS — I10 Essential (primary) hypertension: Secondary | ICD-10-CM | POA: Diagnosis not present

## 2020-10-28 DIAGNOSIS — D649 Anemia, unspecified: Secondary | ICD-10-CM | POA: Diagnosis not present

## 2020-10-28 DIAGNOSIS — E1122 Type 2 diabetes mellitus with diabetic chronic kidney disease: Secondary | ICD-10-CM | POA: Diagnosis not present

## 2020-10-28 DIAGNOSIS — N182 Chronic kidney disease, stage 2 (mild): Secondary | ICD-10-CM | POA: Diagnosis not present

## 2020-11-30 DIAGNOSIS — N183 Chronic kidney disease, stage 3 unspecified: Secondary | ICD-10-CM | POA: Diagnosis not present

## 2020-11-30 DIAGNOSIS — E1122 Type 2 diabetes mellitus with diabetic chronic kidney disease: Secondary | ICD-10-CM | POA: Diagnosis not present

## 2020-11-30 DIAGNOSIS — I10 Essential (primary) hypertension: Secondary | ICD-10-CM | POA: Diagnosis not present

## 2020-11-30 DIAGNOSIS — E782 Mixed hyperlipidemia: Secondary | ICD-10-CM | POA: Diagnosis not present

## 2020-12-29 DIAGNOSIS — I10 Essential (primary) hypertension: Secondary | ICD-10-CM | POA: Diagnosis not present

## 2020-12-29 DIAGNOSIS — N182 Chronic kidney disease, stage 2 (mild): Secondary | ICD-10-CM | POA: Diagnosis not present

## 2020-12-29 DIAGNOSIS — D649 Anemia, unspecified: Secondary | ICD-10-CM | POA: Diagnosis not present

## 2020-12-29 DIAGNOSIS — E1122 Type 2 diabetes mellitus with diabetic chronic kidney disease: Secondary | ICD-10-CM | POA: Diagnosis not present

## 2021-01-26 DIAGNOSIS — I10 Essential (primary) hypertension: Secondary | ICD-10-CM | POA: Diagnosis not present

## 2021-01-26 DIAGNOSIS — E1122 Type 2 diabetes mellitus with diabetic chronic kidney disease: Secondary | ICD-10-CM | POA: Diagnosis not present

## 2021-01-26 DIAGNOSIS — N182 Chronic kidney disease, stage 2 (mild): Secondary | ICD-10-CM | POA: Diagnosis not present

## 2021-01-26 DIAGNOSIS — D649 Anemia, unspecified: Secondary | ICD-10-CM | POA: Diagnosis not present

## 2021-02-25 DIAGNOSIS — I10 Essential (primary) hypertension: Secondary | ICD-10-CM | POA: Diagnosis not present

## 2021-02-25 DIAGNOSIS — D649 Anemia, unspecified: Secondary | ICD-10-CM | POA: Diagnosis not present

## 2021-02-25 DIAGNOSIS — E1122 Type 2 diabetes mellitus with diabetic chronic kidney disease: Secondary | ICD-10-CM | POA: Diagnosis not present

## 2021-02-25 DIAGNOSIS — N182 Chronic kidney disease, stage 2 (mild): Secondary | ICD-10-CM | POA: Diagnosis not present

## 2021-03-10 DIAGNOSIS — I69354 Hemiplegia and hemiparesis following cerebral infarction affecting left non-dominant side: Secondary | ICD-10-CM | POA: Diagnosis not present

## 2021-03-17 DIAGNOSIS — I69354 Hemiplegia and hemiparesis following cerebral infarction affecting left non-dominant side: Secondary | ICD-10-CM | POA: Diagnosis not present

## 2021-03-17 DIAGNOSIS — I890 Lymphedema, not elsewhere classified: Secondary | ICD-10-CM | POA: Diagnosis not present

## 2021-03-23 DIAGNOSIS — I10 Essential (primary) hypertension: Secondary | ICD-10-CM | POA: Diagnosis not present

## 2021-03-23 DIAGNOSIS — Z1389 Encounter for screening for other disorder: Secondary | ICD-10-CM | POA: Diagnosis not present

## 2021-03-23 DIAGNOSIS — I69359 Hemiplegia and hemiparesis following cerebral infarction affecting unspecified side: Secondary | ICD-10-CM | POA: Diagnosis not present

## 2021-03-23 DIAGNOSIS — E782 Mixed hyperlipidemia: Secondary | ICD-10-CM | POA: Diagnosis not present

## 2021-03-23 DIAGNOSIS — E1122 Type 2 diabetes mellitus with diabetic chronic kidney disease: Secondary | ICD-10-CM | POA: Diagnosis not present

## 2021-03-23 DIAGNOSIS — Z0000 Encounter for general adult medical examination without abnormal findings: Secondary | ICD-10-CM | POA: Diagnosis not present

## 2021-03-23 DIAGNOSIS — M109 Gout, unspecified: Secondary | ICD-10-CM | POA: Diagnosis not present

## 2021-03-24 ENCOUNTER — Other Ambulatory Visit: Payer: Self-pay | Admitting: Internal Medicine

## 2021-03-24 DIAGNOSIS — M810 Age-related osteoporosis without current pathological fracture: Secondary | ICD-10-CM

## 2021-03-28 DIAGNOSIS — E1122 Type 2 diabetes mellitus with diabetic chronic kidney disease: Secondary | ICD-10-CM | POA: Diagnosis not present

## 2021-03-28 DIAGNOSIS — I10 Essential (primary) hypertension: Secondary | ICD-10-CM | POA: Diagnosis not present

## 2021-03-28 DIAGNOSIS — E782 Mixed hyperlipidemia: Secondary | ICD-10-CM | POA: Diagnosis not present

## 2021-03-28 DIAGNOSIS — N183 Chronic kidney disease, stage 3 unspecified: Secondary | ICD-10-CM | POA: Diagnosis not present

## 2021-04-07 ENCOUNTER — Other Ambulatory Visit: Payer: Self-pay | Admitting: Internal Medicine

## 2021-04-07 DIAGNOSIS — Z1231 Encounter for screening mammogram for malignant neoplasm of breast: Secondary | ICD-10-CM

## 2021-05-01 DIAGNOSIS — D649 Anemia, unspecified: Secondary | ICD-10-CM | POA: Diagnosis not present

## 2021-05-01 DIAGNOSIS — E1122 Type 2 diabetes mellitus with diabetic chronic kidney disease: Secondary | ICD-10-CM | POA: Diagnosis not present

## 2021-05-01 DIAGNOSIS — N182 Chronic kidney disease, stage 2 (mild): Secondary | ICD-10-CM | POA: Diagnosis not present

## 2021-05-01 DIAGNOSIS — I10 Essential (primary) hypertension: Secondary | ICD-10-CM | POA: Diagnosis not present

## 2021-05-29 DIAGNOSIS — I10 Essential (primary) hypertension: Secondary | ICD-10-CM | POA: Diagnosis not present

## 2021-05-29 DIAGNOSIS — N182 Chronic kidney disease, stage 2 (mild): Secondary | ICD-10-CM | POA: Diagnosis not present

## 2021-05-29 DIAGNOSIS — E1122 Type 2 diabetes mellitus with diabetic chronic kidney disease: Secondary | ICD-10-CM | POA: Diagnosis not present

## 2021-05-29 DIAGNOSIS — D649 Anemia, unspecified: Secondary | ICD-10-CM | POA: Diagnosis not present

## 2021-07-31 DIAGNOSIS — E782 Mixed hyperlipidemia: Secondary | ICD-10-CM | POA: Diagnosis not present

## 2021-07-31 DIAGNOSIS — N183 Chronic kidney disease, stage 3 unspecified: Secondary | ICD-10-CM | POA: Diagnosis not present

## 2021-07-31 DIAGNOSIS — E1122 Type 2 diabetes mellitus with diabetic chronic kidney disease: Secondary | ICD-10-CM | POA: Diagnosis not present

## 2021-07-31 DIAGNOSIS — I10 Essential (primary) hypertension: Secondary | ICD-10-CM | POA: Diagnosis not present

## 2021-08-15 DIAGNOSIS — I10 Essential (primary) hypertension: Secondary | ICD-10-CM | POA: Diagnosis not present

## 2021-08-15 DIAGNOSIS — S81802A Unspecified open wound, left lower leg, initial encounter: Secondary | ICD-10-CM | POA: Diagnosis not present

## 2021-08-24 DIAGNOSIS — S81802D Unspecified open wound, left lower leg, subsequent encounter: Secondary | ICD-10-CM | POA: Diagnosis not present

## 2021-08-28 DIAGNOSIS — E1140 Type 2 diabetes mellitus with diabetic neuropathy, unspecified: Secondary | ICD-10-CM | POA: Diagnosis not present

## 2021-08-28 DIAGNOSIS — E782 Mixed hyperlipidemia: Secondary | ICD-10-CM | POA: Diagnosis not present

## 2021-08-28 DIAGNOSIS — I10 Essential (primary) hypertension: Secondary | ICD-10-CM | POA: Diagnosis not present

## 2021-08-31 DIAGNOSIS — I69354 Hemiplegia and hemiparesis following cerebral infarction affecting left non-dominant side: Secondary | ICD-10-CM | POA: Diagnosis not present

## 2021-08-31 DIAGNOSIS — I890 Lymphedema, not elsewhere classified: Secondary | ICD-10-CM | POA: Diagnosis not present

## 2021-09-11 NOTE — Progress Notes (Addendum)
Laurie Hunter, BUBB ([id number]) Visit Report for 09/12/2021 Allergy List Details Patient Name: Date of Service: [name], [name] 09/12/2021 1:15 PM Medical Record Number: [id number] Patient Account Number: [id number] Date of Birth/Sex: Treating RN: 01-Feb-1946 (76 y.o. Laurie Hunter Primary Care Zakry Caso: [name] Other Clinician: Referring Sophiya Morello: Treating Daesean Lazarz/Extender: Rhetta Mura Weeks in Treatment: 0 Allergies Active Allergies penicillin erythromycin base lisinopril valsartan Allergy Notes Electronic Signature(s) Signed: 09/12/2021 3:41:54 PM By: Sharyn Creamer RN, BSN Previous Signature: 09/11/2021 3:11:25 PM Version By: Valeria Batman EMT Entered By: Sharyn Creamer on 09/12/2021 13:49:34 -------------------------------------------------------------------------------- Arrival Information Details Patient Name: Date of Service: [name], [name] 09/12/2021 1:15 PM Medical Record Number: [id number] Patient Account Number: [id number] Date of Birth/Sex: Treating RN: Nov 21, 1945 (75 y.o. Laurie Hunter Primary Care [name]: [name] Other Clinician: Referring [name]: Treating [name]/Extender: [name] in Treatment: 0 Visit Information Patient Arrived: Wheel Chair Arrival Time: 13:39 Accompanied By: friend Transfer Assistance: Manual Patient Identification Verified: Yes Secondary Verification Process Completed: Yes Patient Requires Transmission-Based Precautions: No Patient Has Alerts: Yes Patient Alerts: Patient on Blood Thinner Notes Plavix and Aspirin Electronic Signature(s) Signed: 09/12/2021 3:41:54 PM By: Sharyn Creamer RN, BSN Entered By: Sharyn Creamer on 09/12/2021 13:42:26 -------------------------------------------------------------------------------- Clinic Level of Care Assessment Details Patient Name: Date of Service: [name], [name] 09/12/2021 1:15 PM Medical Record Number:  [id number] Patient Account Number: [id number] Date of Birth/Sex: Treating RN: [birth year] (76 y.o. Laurie Hunter Primary Care [name]: [name] Other Clinician: Referring [name]: Treating [name]/Extender: [name] in Treatment: 0 Clinic Level of Care Assessment Items TOOL 1 Quantity Score X- 1 0 Use when EandM and Procedure is performed on INITIAL visit ASSESSMENTS - Nursing Assessment / Reassessment X- 1 20 General Physical Exam (combine w/ comprehensive assessment (listed just below) when performed on new pt. evals) X- 1 25 Comprehensive Assessment (HX, ROS, Risk Assessments, Wounds Hx, etc.) ASSESSMENTS - Wound and Skin Assessment / Reassessment X- 1 10 Dermatologic / Skin Assessment (not related to wound area) ASSESSMENTS - Ostomy and/or Continence Assessment and Care []  - 0 Incontinence Assessment and Management []  - 0 Ostomy Care Assessment and Management (repouching, etc.) PROCESS - Coordination of Care []  - 0 Simple Patient / Family Education for ongoing care X- 1 20 Complex (extensive) Patient / Family Education for ongoing care X- 1 10 Staff obtains Programmer, systems, Records, T Results / Process Orders est []  - 0 Staff telephones HHA, Nursing Homes / Clarify orders / etc []  - 0 Routine Transfer to another Facility (non-emergent condition) []  - 0 Routine Hospital Admission (non-emergent condition) X- 1 15 New Admissions / Biomedical engineer / Ordering NPWT Apligraf, etc. , []  - 0 Emergency Hospital Admission (emergent condition) PROCESS - Special Needs []  - 0 Pediatric / Minor Patient Management []  - 0 Isolation Patient Management []  - 0 Hearing / Language / Visual special needs []  - 0 Assessment of Community assistance (transportation, D/C planning, etc.) []  - 0 Additional assistance / Altered mentation []  - 0 Support Surface(s) Assessment (bed, cushion, seat, etc.) INTERVENTIONS - Miscellaneous []  - 0 External  ear exam []  - 0 Patient Transfer (multiple staff / Civil Service fast streamer / Similar devices) []  - 0 Simple Staple / Suture removal (25 or less) []  - 0 Complex Staple / Suture removal (26 or more) []  - 0 Hypo/Hyperglycemic Management (do not check if billed separately) X- 1 15 Ankle / Brachial Index (ABI) - do  not check if billed separately Has the patient been seen at the hospital within the last three years: Yes Total Score: 115 Level Of Care: New/Established - Level 3 Electronic Signature(s) Signed: 09/12/2021 3:24:27 PM By: Shawn Stall RN, BSN Entered By: Shawn Stall on 09/12/2021 15:13:30 -------------------------------------------------------------------------------- Encounter Discharge Information Details Patient Name: Date of Service: [name], [name] 09/12/2021 1:15 PM Medical Record Number: [id number] Patient Account Number: [id number] Date of Birth/Sex: Treating RN: 1945-11-13 (76 y.o. Laurie Hunter, Laurie Hunter Primary Care Taylorann Tkach: Georgann Housekeeper Other Clinician: Referring Lecretia Buczek: Treating Jacori Mulrooney/Extender: Adline Mango in Treatment: 0 Encounter Discharge Information Items Post Procedure Vitals Discharge Condition: Stable Temperature (F): 98.1 Ambulatory Status: Wheelchair Pulse (bpm): 102 Discharge Destination: Home Respiratory Rate (breaths/min): 20 Transportation: Private Auto Blood Pressure (mmHg): 156/79 Accompanied By: friend Schedule Follow-up Appointment: Yes Clinical Summary of Care: Electronic Signature(s) Signed: 09/12/2021 3:24:27 PM By: Shawn Stall RN, BSN Entered By: Shawn Stall on 09/12/2021 15:14:43 -------------------------------------------------------------------------------- Lower Extremity Assessment Details Patient Name: Date of Service: [name], [name] 09/12/2021 1:15 PM Medical Record Number: [id number] Patient Account Number: [id number] Date of Birth/Sex: Treating RN: 14-Nov-1945 (76 y.o. Laurie Hunter Primary Care [name]: Georgann Housekeeper Other Clinician: Referring Billal Rollo: Treating Adraine Biffle/Extender: Orvan Seen Weeks in Treatment: 0 Edema Assessment Assessed: [Left: No] [Right: No] Edema: [Left: Ye] [Right: s] Calf Left: Right: Point of Measurement: From Medial Instep 40.5 cm Ankle Left: Right: Point of Measurement: From Medial Instep 24.4 cm Vascular Assessment Pulses: Dorsalis Pedis Palpable: [Left:Yes] Electronic Signature(s) Signed: 09/12/2021 3:41:54 PM By: Redmond Pulling RN, BSN Signed: 09/12/2021 3:41:54 PM By: Redmond Pulling RN, BSN Entered By: Redmond Pulling on 09/12/2021 14:30:12 -------------------------------------------------------------------------------- Multi Wound Chart Details Patient Name: Date of Service: [name], [name] 09/12/2021 1:15 PM Medical Record Number: [id number] Patient Account Number: [id number] Date of Birth/Sex: Treating RN: [birth year] (76 y.o. Arta Silence Primary Care Bates Collington: Georgann Housekeeper Other Clinician: Referring Alvetta Hidrogo: Treating Caymen Dubray/Extender: Orvan Seen Weeks in Treatment: 0 Vital Signs Height(in): 58 Capillary Blood Glucose(mg/dl): 92 Weight(lbs): Pulse(bpm): 102 Body Mass Index(BMI): Blood Pressure(mmHg): 156/79 Temperature(F): 98.1 Respiratory Rate(breaths/min): 22 Photos: [N/A:N/A] Left, Posterior Lower Leg N/A N/A Wound Location: Shear/Friction N/A N/A Wounding Event: Venous Leg Ulcer N/A N/A Primary Etiology: Cataracts, Anemia, Lymphedema, N/A N/A Comorbid History: Hypertension, Type II Diabetes, Osteoarthritis 08/16/2020 N/A N/A Date Acquired: 0 N/A N/A Weeks of Treatment: Open N/A N/A Wound Status: No N/A N/A Wound Recurrence: 4.5x2.5x0.1 N/A N/A Measurements L x W x D (cm) 8.836 N/A N/A A (cm) : rea 0.884 N/A N/A Volume (cm) : 0.00% N/A N/A % Reduction in A rea: 0.00% N/A N/A % Reduction in Volume: Partial Thickness N/A  N/A Classification: Medium N/A N/A Exudate A mount: Serosanguineous N/A N/A Exudate Type: red, brown N/A N/A Exudate Color: Flat and Intact N/A N/A Wound Margin: Large (67-100%) N/A N/A Granulation A mount: Red N/A N/A Granulation Quality: None Present (0%) N/A N/A Necrotic A mount: Fat Layer (Subcutaneous Tissue): Yes N/A N/A Exposed Structures: Fascia: No Tendon: No Muscle: No Joint: No Bone: No Small (1-33%) N/A N/A Epithelialization: Treatment Notes Electronic Signature(s) Signed: 09/12/2021 3:08:23 PM By: Geralyn Corwin DO Signed: 09/12/2021 3:24:27 PM By: Shawn Stall RN, BSN Entered By: Geralyn Corwin on 09/12/2021 14:56:36 -------------------------------------------------------------------------------- Multi-Disciplinary Care Plan Details Patient Name: Date of Service: [name], [name] 09/12/2021 1:15 PM Medical Record Number: [id number] Patient Account Number: [id number] Date of Birth/Sex: Treating RN: 12-25-1945 (75  y.o. Laurie Hunter, Tammi Klippel Primary Care Jaonna Word: [name] Other Clinician: Referring Quincey Nored: Treating Melvine Julin/Extender: [name] in Treatment: 0 Active Inactive Orientation to the Wound Care Program Nursing Diagnoses: Knowledge deficit related to the wound healing center program Goals: Patient/caregiver will verbalize understanding of the [facility] Program Date Initiated: 09/12/2021 Target Resolution Date: 09/22/2021 Goal Status: Active Interventions: Provide education on orientation to the wound center Notes: Pain, Acute or Chronic Nursing Diagnoses: Pain, acute or chronic: actual or potential Potential alteration in comfort, pain Goals: Patient will verbalize adequate pain control and receive pain control interventions during procedures as needed Date Initiated: 09/12/2021 Target Resolution Date: 09/21/2021 Goal Status: Active Interventions: Encourage patient to take pain medications  as prescribed Provide education on pain management Reposition patient for comfort Treatment Activities: Administer pain control measures as ordered : 09/12/2021 Notes: Wound/Skin Impairment Nursing Diagnoses: Knowledge deficit related to ulceration/compromised skin integrity Goals: Patient/caregiver will verbalize understanding of skin care regimen Date Initiated: 09/12/2021 Target Resolution Date: 09/22/2021 Goal Status: Active Interventions: Assess patient/caregiver ability to obtain necessary supplies Assess patient/caregiver ability to perform ulcer/skin care regimen upon admission and as needed Provide education on ulcer and skin care Treatment Activities: Skin care regimen initiated : 09/12/2021 Topical wound management initiated : 09/12/2021 Notes: Electronic Signature(s) Signed: 09/12/2021 3:24:27 PM By: Deon Pilling RN, BSN Entered By: Deon Pilling on 09/12/2021 14:22:03 -------------------------------------------------------------------------------- Pain Assessment Details Patient Name: Date of Service: [name], [name] 09/12/2021 1:15 PM Medical Record Number: [id number] Patient Account Number: [id number] Date of Birth/Sex: Treating RN: 01/21/1946 (76 y.o. Laurie Hunter Primary Care Breanna Shorkey: [name] Other Clinician: Referring Shealyn Sean: Treating Mico Spark/Extender: Rhetta Mura Weeks in Treatment: 0 Active Problems Location of Pain Severity and Description of Pain Patient Has Paino No Site Locations Pain Management and Medication Current Pain Management: Electronic Signature(s) Signed: 09/12/2021 3:41:54 PM By: Sharyn Creamer RN, BSN Entered By: Sharyn Creamer on 09/12/2021 14:05:27 -------------------------------------------------------------------------------- Patient/Caregiver Education Details Patient Name: Date of Service: [name], [name] 2/28/2023andnbsp1:15 PM Medical Record Number: [id number] Patient Account Number:  [id number] Date of Birth/Gender: Treating RN: [birth year] (76 y.o. Laurie Hunter Primary Care Physician: [name] Other Clinician: Referring Physician: Treating Physician/Extender: [name] in Treatment: 0 Education Assessment Education Provided To: Patient Education Topics Provided Welcome T [facility]: o Handouts: Welcome T [facility] o Methods: Explain/Verbal, Printed Responses: Reinforcements needed Electronic Signature(s) Signed: 09/12/2021 3:24:27 PM By: Deon Pilling RN, BSN Entered By: Deon Pilling on 09/12/2021 14:22:21 -------------------------------------------------------------------------------- Wound Assessment Details Patient Name: Date of Service: [name], [name] 09/12/2021 1:15 PM Medical Record Number: [id number] Patient Account Number: [id number] Date of Birth/Sex: Treating RN: 1945-09-23 (76 y.o. Laurie Hunter, Meta.Reding Primary Care Onesty Clair: [name] Other Clinician: Referring Nickie Deren: Treating Aubrey Voong/Extender: Rhetta Mura Weeks in Treatment: 0 Wound Status Wound Number: 1 Primary Venous Leg Ulcer Etiology: Wound Location: Left, Posterior Lower Leg Wound Open Wounding Event: Shear/Friction Status: Date Acquired: 08/16/2020 Comorbid Cataracts, Anemia, Lymphedema, Hypertension, Type II Weeks Of Treatment: 0 History: Diabetes, Osteoarthritis Clustered Wound: No Photos Wound Measurements Length: (cm) 4.5 Width: (cm) 2.5 Depth: (cm) 0.1 Area: (cm) 8.836 Volume: (cm) 0.884 % Reduction in Area: 0% % Reduction in Volume: 0% Epithelialization: Small (1-33%) Tunneling: No Undermining: No Wound Description Classification: Partial Thickness Wound Margin: Flat and Intact Exudate Amount: Medium Exudate Type: Serosanguineous Exudate Color: red, brown Foul Odor After Cleansing: No Slough/Fibrino No Wound  Bed Granulation Amount: Large (67-100%) Exposed  Structure Granulation Quality: Red Fascia Exposed: No Necrotic Amount: None Present (0%) Fat Layer (Subcutaneous Tissue) Exposed: Yes Tendon Exposed: No Muscle Exposed: No Joint Exposed: No Bone Exposed: No Treatment Notes Wound #1 (Lower Leg) Wound Laterality: Left, Posterior Cleanser Soap and Water Discharge Instruction: May shower and wash wound with dial antibacterial soap and water prior to dressing change. Peri-Wound Care Skin Prep Discharge Instruction: Use skin prep as directed Topical Primary Dressing Hydrofera Blue Ready Foam, 4x5 in Discharge Instruction: Apply to wound bed as instructed Santyl Ointment Discharge Instruction: Apply ****IN CLINIC ONLY.**** Secondary Dressing Bordered Gauze, 4x4 in Discharge Instruction: Apply over primary dressing as directed. Secured With Compression Wrap Compression Stockings Environmental education officer) Signed: 09/12/2021 3:24:27 PM By: Deon Pilling RN, BSN Entered By: Deon Pilling on 09/12/2021 15:01:25 -------------------------------------------------------------------------------- Wound Assessment Details Patient Name: Date of Service: [name], [name] 09/12/2021 1:15 PM Medical Record Number: [id number] Patient Account Number: [id number] Date of Birth/Sex: Treating RN: February 26, 1946 (76 y.o. Laurie Hunter, Meta.Reding Primary Care Casin Federici: [name] Other Clinician: Referring Messina Kosinski: Treating Miyako Oelke/Extender: Rhetta Mura Weeks in Treatment: 0 Wound Status Wound Number: 2 Primary Lymphedema Etiology: Wound Location: Left, Medial, Posterior Upper Leg Wound Open Wounding Event: Gradually Appeared Status: Date Acquired: 08/12/2021 Comorbid Cataracts, Anemia, Lymphedema, Hypertension, Type II Weeks Of Treatment: 0 History: Diabetes, Osteoarthritis Clustered Wound: Yes Wound Measurements Length: (cm) 2 Width: (cm) 1 Depth: (cm) 0 Clustered Quantity: 2 Area: (cm) Volume: (cm) %  Reduction in Area: % Reduction in Volume: .1 Epithelialization: None Tunneling: No 1.571 Undermining: No 0.157 Wound Description Classification: Full Thickness Without Exposed Support Structures Wound Margin: Distinct, outline attached Exudate Amount: Medium Exudate Type: Serosanguineous Exudate Color: red, brown Wound Bed Granulation Amount: Large (67-100%) Granulation Quality: Pink Necrotic Amount: Small (1-33%) Necrotic Quality: Adherent Slough Foul Odor After Cleansing: No Slough/Fibrino Yes Exposed Structure Fascia Exposed: No Fat Layer (Subcutaneous Tissue) Exposed: Yes Tendon Exposed: No Muscle Exposed: No Joint Exposed: No Bone Exposed: No Treatment Notes Wound #2 (Upper Leg) Wound Laterality: Left, Medial, Posterior Cleanser Soap and Water Discharge Instruction: May shower and wash wound with dial antibacterial soap and water prior to dressing change. Peri-Wound Care Skin Prep Discharge Instruction: Use skin prep as directed Topical Primary Dressing Hydrofera Blue Ready Foam, 4x5 in Discharge Instruction: Apply to wound bed as instructed Santyl Ointment Discharge Instruction: Apply ****IN CLINIC ONLY.**** Secondary Dressing Bordered Gauze, 2x3.75 in Discharge Instruction: Apply over primary dressing as directed. Secured With Compression Wrap Compression Stockings Environmental education officer) Signed: 09/12/2021 3:24:27 PM By: Deon Pilling RN, BSN Entered By: Deon Pilling on 09/12/2021 15:01:03 -------------------------------------------------------------------------------- Vitals Details Patient Name: Date of Service: [name], [name] 09/12/2021 1:15 PM Medical Record Number: [id number] Patient Account Number: [id number] Date of Birth/Sex: Treating RN: 05-26-1946 (76 y.o. Laurie Hunter Primary Care Lucion Dilger: [name] Other Clinician: Referring Idella Lamontagne: Treating Randal Goens/Extender: Rhetta Mura Weeks in Treatment:  0 Vital Signs Time Taken: 13:45 Temperature (F): 98.1 Height (in): 58 Pulse (bpm): 102 Source: Stated Respiratory Rate (breaths/min): 22 Source: Stated Blood Pressure (mmHg): 156/79 Capillary Blood Glucose (mg/dl): 92 Reference Range: 80 - 120 mg / dl Electronic Signature(s) Signed: 09/12/2021 3:41:54 PM By: Sharyn Creamer RN, BSN Entered By: Sharyn Creamer on 09/12/2021 13:49:01

## 2021-09-12 ENCOUNTER — Encounter (HOSPITAL_BASED_OUTPATIENT_CLINIC_OR_DEPARTMENT_OTHER): Payer: Medicare Other | Attending: Internal Medicine | Admitting: Internal Medicine

## 2021-09-12 ENCOUNTER — Other Ambulatory Visit: Payer: Self-pay

## 2021-09-12 DIAGNOSIS — E1122 Type 2 diabetes mellitus with diabetic chronic kidney disease: Secondary | ICD-10-CM | POA: Insufficient documentation

## 2021-09-12 DIAGNOSIS — I890 Lymphedema, not elsewhere classified: Secondary | ICD-10-CM | POA: Diagnosis not present

## 2021-09-12 DIAGNOSIS — E11622 Type 2 diabetes mellitus with other skin ulcer: Secondary | ICD-10-CM | POA: Insufficient documentation

## 2021-09-12 DIAGNOSIS — Z7984 Long term (current) use of oral hypoglycemic drugs: Secondary | ICD-10-CM | POA: Insufficient documentation

## 2021-09-12 DIAGNOSIS — I129 Hypertensive chronic kidney disease with stage 1 through stage 4 chronic kidney disease, or unspecified chronic kidney disease: Secondary | ICD-10-CM | POA: Insufficient documentation

## 2021-09-12 DIAGNOSIS — I69354 Hemiplegia and hemiparesis following cerebral infarction affecting left non-dominant side: Secondary | ICD-10-CM | POA: Insufficient documentation

## 2021-09-12 DIAGNOSIS — I639 Cerebral infarction, unspecified: Secondary | ICD-10-CM

## 2021-09-12 DIAGNOSIS — N183 Chronic kidney disease, stage 3 unspecified: Secondary | ICD-10-CM | POA: Insufficient documentation

## 2021-09-12 DIAGNOSIS — L97822 Non-pressure chronic ulcer of other part of left lower leg with fat layer exposed: Secondary | ICD-10-CM | POA: Diagnosis not present

## 2021-09-12 NOTE — Progress Notes (Signed)
Laurie Hunter, Laurie Hunter ([id number]) Visit Report for 09/12/2021 Abuse Risk Screen Details Patient Name: Date of Service: [name], [name] 09/12/2021 1:15 PM Medical Record Number: [id number] Patient Account Number: [id number] Date of Birth/Sex: Treating RN: 1945/12/19 (76 y.o. [name] Primary Care Kamauri Denardo: Georgann Housekeeper Other Clinician: Referring Deania Siguenza: Treating Santonio Speakman/Extender: Adline Mango in Treatment: 0 Abuse Risk Screen Items Answer ABUSE RISK SCREEN: Has anyone close to you tried to hurt or harm you recentlyo No Do you feel uncomfortable with anyone in your familyo No Has anyone forced you do things that you didnt want to doo No Electronic Signature(s) Signed: 09/12/2021 3:41:54 PM By: Redmond Pulling RN, BSN Entered By: Redmond Pulling on 09/12/2021 13:59:19 -------------------------------------------------------------------------------- Activities of Daily Living Details Patient Name: Date of Service: [name], [name] 09/12/2021 1:15 PM Medical Record Number: [id number] Patient Account Number: [id number] Date of Birth/Sex: Treating RN: [birth year] (76 y.o. [name] Primary Care Treyvonne Tata: Georgann Housekeeper Other Clinician: Referring Radonna Bracher: Treating Tashira Torre/Extender: Orvan Seen Weeks in Treatment: 0 Activities of Daily Living Items Answer Activities of Daily Living (Please select one for each item) Drive Automobile Not Able T Medications ake Need Assistance Use T elephone Completely Able Care for Appearance Need Assistance Use T oilet Need Assistance Bath / Shower Need Assistance Dress Self Need Assistance Feed Self Completely Able Walk Need Assistance Get In / Out Bed Need Assistance Housework Need Assistance Prepare Meals Need Assistance Handle Money Need Assistance Shop for Self Need Assistance Electronic Signature(s) Signed: 09/12/2021 3:41:54 PM By: Redmond Pulling RN, BSN Entered By: Redmond Pulling on 09/12/2021 14:00:19 -------------------------------------------------------------------------------- Education Screening Details Patient Name: Date of Service: [name], [name] 09/12/2021 1:15 PM Medical Record Number: [id number] Patient Account Number: [id number] Date of Birth/Sex: Treating RN: [birth year] (76 y.o. [name] Primary Care Jaylee Freeze: Georgann Housekeeper Other Clinician: Referring Mareesa Gathright: Treating Ying Blankenhorn/Extender: Adline Mango in Treatment: 0 Primary Learner Assessed: Patient Learning Preferences/Education Level/Primary Language Learning Preference: Explanation, Demonstration, Printed Material Highest Education Level: College or Above Preferred Language: English Cognitive Barrier Language Barrier: No Translator Needed: No Memory Deficit: No Emotional Barrier: No Cultural/Religious Beliefs Affecting Medical Care: No Physical Barrier Impaired Vision: No Impaired Hearing: No Decreased Hand dexterity: Yes Limitations: left side weakness Knowledge/Comprehension Knowledge Level: High Comprehension Level: High Ability to understand written instructions: High Ability to understand verbal instructions: High Motivation Anxiety Level: Calm Cooperation: Cooperative Education Importance: Acknowledges Need Interest in Health Problems: Asks Questions Perception: Coherent Willingness to Engage in Self-Management High Activities: Readiness to Engage in Self-Management High Activities: Electronic Signature(s) Signed: 09/12/2021 3:41:54 PM By: Redmond Pulling RN, BSN Entered By: Redmond Pulling on 09/12/2021 14:02:25 -------------------------------------------------------------------------------- Fall Risk Assessment Details Patient Name: Date of Service: [name], [name] 09/12/2021 1:15 PM Medical Record Number: [id number] Patient Account Number: [id number] Date of Birth/Sex: Treating RN: [birth year] (76 y.o. [name] Primary Care Able Malloy: Georgann Housekeeper Other Clinician: Referring Vikrant Pryce: Treating Cristel Rail/Extender: Adline Mango in Treatment: 0 Fall Risk Assessment Items Have you had 2 or more falls in the last 12 monthso 0 No Have you had any fall that resulted in injury in the last 12 monthso 0 No FALLS RISK SCREEN History of falling - immediate or within 3 months 0 No Secondary diagnosis (Do you have 2 or more medical diagnoseso) 0 No Ambulatory aid None/bed rest/wheelchair/nurse 0 No Crutches/cane/walker 0 No Furniture 0 No Intravenous therapy Access/Saline/Heparin [employer]  0 No Gait/Transferring Normal/ bed rest/ wheelchair 0 No Weak (short steps with or without shuffle, stooped but able to lift head while walking, may seek 0 No support from furniture) Impaired (short steps with shuffle, may have difficulty arising from chair, head down, impaired 0 No balance) Mental Status Oriented to own ability 0 No Electronic Signature(s) Signed: 09/12/2021 3:41:54 PM By: Redmond Pulling RN, BSN Entered By: Redmond Pulling on 09/12/2021 14:03:39 -------------------------------------------------------------------------------- Foot Assessment Details Patient Name: Date of Service: [name], [name] 09/12/2021 1:15 PM Medical Record Number: [id number] Patient Account Number: [id number] Date of Birth/Sex: Treating RN: 06-03-1946 (76 y.o. [name] Primary Care Dory Verdun: Georgann Housekeeper Other Clinician: Referring Jurell Basista: Treating Ceaser Ebeling/Extender: Orvan Seen Weeks in Treatment: 0 Foot Assessment Items Site Locations + = Sensation present, - = Sensation absent, C = Callus, U = Ulcer R = Redness, W = Warmth, M = Maceration, PU = Pre-ulcerative lesion F = Fissure, S = Swelling, D = Dryness Assessment Right: Left: Other Deformity: No No Prior Foot Ulcer: No No Prior Amputation: No No Charcot Joint: No No Ambulatory  Status: Gait: Electronic Signature(s) Signed: 09/12/2021 3:41:54 PM By: Redmond Pulling RN, BSN Entered By: Redmond Pulling on 09/12/2021 14:04:26 -------------------------------------------------------------------------------- Nutrition Risk Screening Details Patient Name: Date of Service: [name], [name] 09/12/2021 1:15 PM Medical Record Number: [id number] Patient Account Number: [id number] Date of Birth/Sex: Treating RN: 1945/09/21 (76 y.o. [name] Primary Care Dorine Duffey: Georgann Housekeeper Other Clinician: Referring Jaekwon Mcclune: Treating Kripa Foskey/Extender: Orvan Seen Weeks in Treatment: 0 Height (in): 58 Weight (lbs): Body Mass Index (BMI): Nutrition Risk Screening Items Score Screening NUTRITION RISK SCREEN: I have an illness or condition that made me change the kind and/or amount of food I eat 0 No I eat fewer than two meals per day 0 No I eat few fruits and vegetables, or milk products 0 No I have three or more drinks of beer, liquor or wine almost every day 0 No I have tooth or mouth problems that make it hard for me to eat 0 No I don't always have enough money to buy the food I need 0 No I eat alone most of the time 0 No I take three or more different prescribed or over-the-counter drugs a day 0 No Without wanting to, I have lost or gained 10 pounds in the last six months 0 No I am not always physically able to shop, cook and/or feed myself 0 No Nutrition Protocols Good Risk Protocol Moderate Risk Protocol High Risk Proctocol Risk Level: Good Risk Score: 0 Electronic Signature(s) Signed: 09/12/2021 3:41:54 PM By: Redmond Pulling RN, BSN Entered By: Redmond Pulling on 09/12/2021 14:03:55

## 2021-09-12 NOTE — Progress Notes (Signed)
Laurie Hunter, Laurie Hunter ([id number]) Visit Report for 09/12/2021 Chief Complaint Document Details Patient Name: Date of Service: [name], [name] 09/12/2021 1:15 PM Medical Record Number: [id number] Patient Account Number: [id number] Date of Birth/Sex: Treating RN: [birth year] (76 y.o. Laurie Hunter Primary Care Provider: [name] Other Clinician: Referring Provider: Treating Provider/Extender: Rhetta Mura Weeks in Treatment: 0 Information Obtained from: Patient Chief Complaint Left lower extremity wound Electronic Signature(s) Signed: 09/12/2021 3:08:23 PM By: Kalman Shan DO Entered By: Kalman Shan on 09/12/2021 14:56:52 -------------------------------------------------------------------------------- Debridement Details Patient Name: Date of Service: [name], [name] 09/12/2021 1:15 PM Medical Record Number: [id number] Patient Account Number: [id number] Date of Birth/Sex: Treating RN: [birth year] (76 y.o. Laurie Hunter Primary Care Provider: [name] Other Clinician: Referring Provider: Treating Provider/Extender: Rhetta Mura Weeks in Treatment: 0 Debridement Performed for Assessment: Wound #1 Left,Posterior Lower Leg Performed By: Clinician Deon Pilling, RN Debridement Type: Chemical/Enzymatic/Mechanical Agent Used: gauze and wound cleanser Severity of Tissue Pre Debridement: Fat layer exposed Level of Consciousness (Pre-procedure): Awake and Alert Pre-procedure Verification/Time Out No Taken: Bleeding: None Hemostasis Achieved: Pressure Response to Treatment: Procedure was tolerated well Level of Consciousness (Post- Awake and Alert procedure): Post Debridement Measurements of Total Wound Length: (cm) 4.5 Width: (cm) 2.5 Depth: (cm) 0.1 Volume: (cm) 0.884 Character of Wound/Ulcer Post Debridement: Stable Severity of Tissue Post Debridement: Fat layer exposed Post Procedure Diagnosis Same as  Pre-procedure Electronic Signature(s) Signed: 09/12/2021 3:08:23 PM By: Kalman Shan DO Signed: 09/12/2021 3:24:27 PM By: Deon Pilling RN, BSN Signed: 09/12/2021 3:24:27 PM By: Deon Pilling RN, BSN Entered By: Deon Pilling on 09/12/2021 15:02:10 -------------------------------------------------------------------------------- Debridement Details Patient Name: Date of Service: [name], [name] 09/12/2021 1:15 PM Medical Record Number: [id number] Patient Account Number: [id number] Date of Birth/Sex: Treating RN: [birth year] (76 y.o. Laurie Hunter Primary Care Provider: [name] Other Clinician: Referring Provider: Treating Provider/Extender: Rhetta Mura Weeks in Treatment: 0 Debridement Performed for Assessment: Wound #2 Left,Medial,Posterior Upper Leg Performed By: Clinician Deon Pilling, RN Debridement Type: Chemical/Enzymatic/Mechanical Agent Used: gauze and wound cleanser Level of Consciousness (Pre-procedure): Awake and Alert Pre-procedure Verification/Time Out No Taken: Bleeding: None Hemostasis Achieved: Pressure Response to Treatment: Procedure was tolerated well Level of Consciousness (Post- Awake and Alert procedure): Post Debridement Measurements of Total Wound Length: (cm) 2 Width: (cm) 1 Depth: (cm) 0.1 Volume: (cm) 0.157 Character of Wound/Ulcer Post Debridement: Stable Post Procedure Diagnosis Same as Pre-procedure Electronic Signature(s) Signed: 09/12/2021 3:08:23 PM By: Kalman Shan DO Signed: 09/12/2021 3:24:27 PM By: Deon Pilling RN, BSN Entered By: Deon Pilling on 09/12/2021 15:02:25 -------------------------------------------------------------------------------- HPI Details Patient Name: Date of Service: [name], [name] 09/12/2021 1:15 PM Medical Record Number: [id number] Patient Account Number: [id number] Date of Birth/Sex: Treating RN: 01-03-1946 (76 y.o. Laurie Hunter Primary Care Provider: [name]  Other Clinician: Referring Provider: Treating Provider/Extender: Rhetta Mura Weeks in Treatment: 0 History of Present Illness HPI Description: Admission 09/12/2021 Ms. Laurie Hunter is a 76 year old female with a past medical history of type 2 diabetes on oral agents, CVA with left-sided weakness that presents to the clinic for a 1 to 22-month history of wound to her left posterior leg. She states that this started from friction from the mattress she lays on in the chair she sits in. She is not accommodated to help relieve the pressure. She has been followed by her primary care physician for this issue and been treated with  2 rounds of doxycycline. She reports improvement in drainage after taking antibiotics. She currently denies signs of infection. She currently keeps the area covered. Electronic Signature(s) Signed: 09/12/2021 3:08:23 PM By: Kalman Shan DO Signed: 09/12/2021 3:08:23 PM By: Kalman Shan DO Entered By: Kalman Shan on 09/12/2021 14:58:17 -------------------------------------------------------------------------------- Physical Exam Details Patient Name: Date of Service: [name], [name] 09/12/2021 1:15 PM Medical Record Number: [id number] Patient Account Number: [id number] Date of Birth/Sex: Treating RN: 02-May-1946 (76 y.o. Laurie Hunter Primary Care Provider: [name] Other Clinician: Referring Provider: Treating Provider/Extender: Rhetta Mura Weeks in Treatment: 0 Constitutional respirations regular, non-labored and within target range for patient.. Cardiovascular 2+ dorsalis pedis/posterior tibialis pulses. Psychiatric pleasant and cooperative. Notes Left lower extremity: T the posterior thigh there is a large open wound with nonviable tissue and granulation tissue present. There are 2 smaller wounds o adjacent to this with granulation tissue present. All of this is located on a significant fat/Lymphedema  fold. Electronic Signature(s) Signed: 09/12/2021 3:08:23 PM By: Kalman Shan DO Entered By: Kalman Shan on 09/12/2021 15:05:59 -------------------------------------------------------------------------------- Physician Orders Details Patient Name: Date of Service: [name], [name] 09/12/2021 1:15 PM Medical Record Number: [id number] Patient Account Number: [id number] Date of Birth/Sex: Treating RN: [birth year] (76 y.o. Laurie Hunter Primary Care Provider: [name] Other Clinician: Referring Provider: Treating Provider/Extender: [name] in Treatment: 0 Verbal / Phone Orders: No Diagnosis Coding ICD-10 Coding Code Description [phone] Non-pressure chronic ulcer of other part of left lower leg with fat layer exposed I89.0 Lymphedema, not elsewhere classified I63.9 Cerebral infarction, unspecified E11.622 Type 2 diabetes mellitus with other skin ulcer Follow-up Appointments ppointment in 1 week. - Dr. Heber [place] and Allayne Butcher, Room 9 Return A Bathing/ Shower/ Hygiene May shower and wash wound with soap and water. Off-Loading Other: - ensure no pressure or friction/shear to area to aid in wound healing. Wound Treatment Wound #1 - Lower Leg Wound Laterality: Left, Posterior Cleanser: Soap and Water Every Other Day/30 Days Discharge Instructions: May shower and wash wound with dial antibacterial soap and water prior to dressing change. Peri-Wound Care: Skin Prep (DME) (Generic) Every Other Day/30 Days Discharge Instructions: Use skin prep as directed Prim Dressing: Hydrofera Blue Ready Foam, 4x5 in (DME) (Generic) Every Other Day/30 Days ary Discharge Instructions: Apply to wound bed as instructed Prim Dressing: Santyl Ointment Every Other Day/30 Days ary Discharge Instructions: Apply ****IN CLINIC ONLY.**** Secondary Dressing: Bordered Gauze, 4x4 in (DME) (Generic) Every Other Day/30 Days Discharge Instructions: Apply over primary dressing  as directed. Wound #2 - Upper Leg Wound Laterality: Left, Medial, Posterior Cleanser: Soap and Water Every Other Day/30 Days Discharge Instructions: May shower and wash wound with dial antibacterial soap and water prior to dressing change. Peri-Wound Care: Skin Prep (DME) (Generic) Every Other Day/30 Days Discharge Instructions: Use skin prep as directed Prim Dressing: Hydrofera Blue Ready Foam, 4x5 in (DME) (Generic) Every Other Day/30 Days ary Discharge Instructions: Apply to wound bed as instructed Prim Dressing: Santyl Ointment Every Other Day/30 Days ary Discharge Instructions: Apply ****IN CLINIC ONLY.**** Secondary Dressing: Bordered Gauze, 2x3.75 in (DME) (Generic) Every Other Day/30 Days Discharge Instructions: Apply over primary dressing as directed. Patient Medications llergies: penicillin, erythromycin base, lisinopril, valsartan A Notifications Medication Indication Start End 09/12/2021 lidocaine DOSE topical 4 % gel - gel topical applied only in clinic. Electronic Signature(s) Signed: 09/12/2021 3:24:27 PM By: Deon Pilling RN, BSN Signed: 09/12/2021 3:53:03 PM By: Kalman Shan DO Previous Signature:  09/12/2021 3:08:23 PM Version By: Kalman Shan DO Entered By: Deon Pilling on 09/12/2021 15:10:20 Prescription 09/12/2021 -------------------------------------------------------------------------------- Karren Burly D. Kalman Shan DO Patient Name: Provider: 12/25/1945 [id number] Date of Birth: NPI#: [id number] Sex: DEA #: [phone] Phone #: License #: [place] Patient Address: [address] [place], [place] [address] [place], [place] [address] [phone] Allergies penicillin; erythromycin base; lisinopril; valsartan Medication Medication: Route: Strength: Form: lidocaine 4 % topical gel topical 4% gel Class: TOPICAL LOCAL ANESTHETICS Dose: Frequency / Time:  Indication: gel topical applied only in clinic. Number of Refills: Number of Units: 0 Generic Substitution: Start Date: End Date: One Time Use: Substitution Permitted [id number] No Note to Pharmacy: Hand Signature: Date(s): Electronic Signature(s) Signed: 09/12/2021 3:24:27 PM By: Deon Pilling RN, BSN Signed: 09/12/2021 3:53:03 PM By: Kalman Shan DO Previous Signature: 09/12/2021 3:08:23 PM Version By: Kalman Shan DO Entered By: Deon Pilling on 09/12/2021 15:10:21 -------------------------------------------------------------------------------- Problem List Details Patient Name: Date of Service: [name], [name] 09/12/2021 1:15 PM Medical Record Number: [id number] Patient Account Number: [id number] Date of Birth/Sex: Treating RN: 1946-03-18 (76 y.o. Helene Shoe, Tammi Klippel Primary Care Provider: [name] Other Clinician: Referring Provider: Treating Provider/Extender: Rhetta Mura Weeks in Treatment: 0 Active Problems ICD-10 Encounter Code Description Active Date MDM Diagnosis [id number] Non-pressure chronic ulcer of other part of left lower leg with fat layer exposed2/28/2023 No Yes I89.0 Lymphedema, not elsewhere classified 09/12/2021 No Yes I63.9 Cerebral infarction, unspecified 09/12/2021 No Yes E11.622 Type 2 diabetes mellitus with other skin ulcer 09/12/2021 No Yes Inactive Problems Resolved Problems Electronic Signature(s) Signed: 09/12/2021 3:08:23 PM By: Kalman Shan DO Entered By: Kalman Shan on 09/12/2021 14:56:30 -------------------------------------------------------------------------------- Progress Note Details Patient Name: Date of Service: [name], [name] 09/12/2021 1:15 PM Medical Record Number: [id number] Patient Account Number: [id number] Date of Birth/Sex: Treating RN: 1945-11-01 (76 y.o. Laurie Hunter Primary Care Provider: [name] Other Clinician: Referring Provider: Treating Provider/Extender:  Rhetta Mura Weeks in Treatment: 0 Subjective Chief Complaint Information obtained from Patient Left lower extremity wound History of Present Illness (HPI) Admission 09/12/2021 Ms. Raquelle Gismondi is a 76 year old female with a past medical history of type 2 diabetes on oral agents, CVA with left-sided weakness that presents to the clinic for a 1 to 22-month history of wound to her left posterior leg. She states that this started from friction from the mattress she lays on in the chair she sits in. She is not accommodated to help relieve the pressure. She has been followed by her primary care physician for this issue and been treated with 2 rounds of doxycycline. She reports improvement in drainage after taking antibiotics. She currently denies signs of infection. She currently keeps the area covered. Patient History Information obtained from Patient. Allergies penicillin, erythromycin base, lisinopril, valsartan Family History Cancer, Diabetes, Heart Disease, Hypertension, Kidney Disease, Lung Disease, Stroke. Social History Never smoker, Marital Status - Single, Alcohol Use - Never, Drug Use - No History, Caffeine Use - Daily. Medical History Eyes Patient has history of Cataracts - both eyes Hematologic/Lymphatic Patient has history of Anemia, Lymphedema - legs Cardiovascular Patient has history of Hypertension Endocrine Patient has history of Type II Diabetes Musculoskeletal Patient has history of Osteoarthritis Patient is treated with Oral Agents. Blood sugar is tested. Medical A Surgical History Notes nd Constitutional Symptoms (General Health) Morbid obesity Chronic kidney disease Stage 3 Hematologic/Lymphatic Hyperlipidemia Cardiovascular Hypercholsterolemia Genitourinary  CKD stageIII Musculoskeletal Osteoporosis Gout Neurologic CVA Left side hemiparesis Review of Systems (ROS) Eyes Complains or has symptoms of Glasses /  Contacts. Respiratory Complains or has symptoms of Shortness of Breath - with exertion. Endocrine Denies complaints or symptoms of Heat/cold intolerance. Integumentary (Skin) Complains or has symptoms of Wounds - left leg. Psychiatric Denies complaints or symptoms of Claustrophobia, Suicidal. Objective Constitutional respirations regular, non-labored and within target range for patient.. Vitals Time Taken: 1:45 PM, Height: 58 in, Source: Stated, Source: Stated, Temperature: 98.1 F, Pulse: 102 bpm, Respiratory Rate: 22 breaths/min, Blood Pressure: 156/79 mmHg, Capillary Blood Glucose: 92 mg/dl. Cardiovascular 2+ dorsalis pedis/posterior tibialis pulses. Psychiatric pleasant and cooperative. General Notes: Left lower extremity: T the posterior thigh there is a large open wound with nonviable tissue and granulation tissue present. There are 2 smaller o wounds adjacent to this with granulation tissue present. All of this is located on a significant fat/Lymphedema fold. Integumentary (Hair, Skin) Wound #1 status is Open. Original cause of wound was Shear/Friction. The date acquired was: 08/16/2020. The wound is located on the Left,Posterior Lower Leg. The wound measures 4.5cm length x 2.5cm width x 0.1cm depth; 8.836cm^2 area and 0.884cm^3 volume. There is Fat Layer (Subcutaneous Tissue) exposed. There is no tunneling or undermining noted. There is a medium amount of serosanguineous drainage noted. The wound margin is flat and intact. There is large (67-100%) red granulation within the wound bed. There is no necrotic tissue within the wound bed. Wound #2 status is Open. Original cause of wound was Gradually Appeared. The date acquired was: 08/12/2021. The wound is located on the Left,Medial,Posterior Upper Leg. The wound measures 2cm length x 1cm width x 0.1cm depth; 1.571cm^2 area and 0.157cm^3 volume. There is Fat Layer (Subcutaneous Tissue) exposed. There is no tunneling or undermining  noted. There is a medium amount of serosanguineous drainage noted. The wound margin is distinct with the outline attached to the wound base. There is large (67-100%) pink granulation within the wound bed. There is a small (1-33%) amount of necrotic tissue within the wound bed including Adherent Slough. Assessment Active Problems ICD-10 Non-pressure chronic ulcer of other part of left lower leg with fat layer exposed Lymphedema, not elsewhere classified Cerebral infarction, unspecified Type 2 diabetes mellitus with other skin ulcer Patient presents with a 1 to 48-month history of nonhealing ulcer to the posterior thigh. This started out as shear/friction to the skin. I recommended she accommodate to alleviate the pressure to this area when sitting. She has significant lymphedema on exam. I recommended using Hydrofera Blue every other day to the area. No surrounding signs of infection. We will add Santyl in the office to address the nonviable tissue. Follow-up in 1 week. 49 minutes was spent on the encounter including face-to-face, EMR review and coordination of care Plan Follow-up Appointments: Return Appointment in 1 week. - Dr. Heber [place] and Allayne Butcher, Room 9 Bathing/ Shower/ Hygiene: May shower and wash wound with soap and water. Off-Loading: Other: - ensure no pressure or friction/shear to area to aid in wound healing. The following medication(s) was prescribed: lidocaine topical 4 % gel gel topical applied only in clinic. was prescribed at facility WOUND #1: - Lower Leg Wound Laterality: Left, Posterior Cleanser: Soap and Water Every Other Day/30 Days Discharge Instructions: May shower and wash wound with dial antibacterial soap and water prior to dressing change. Peri-Wound Care: Skin Prep (DME) (Generic) Every Other Day/30 Days Discharge Instructions: Use skin prep as directed Prim Dressing: Hydrofera Blue Ready Foam, 4x5  in (DME) (Generic) Every Other Day/30 Days ary Discharge  Instructions: Apply to wound bed as instructed Prim Dressing: Santyl Ointment Every Other Day/30 Days ary Discharge Instructions: Apply ****IN CLINIC ONLY.**** Secondary Dressing: Zetuvit Plus Silicone Border Dressing 5x5 (in/in) (DME) (Generic) Every Other Day/30 Days Discharge Instructions: Apply silicone border over primary dressing as directed. WOUND #2: - Upper Leg Wound Laterality: Left, Medial, Posterior Cleanser: Soap and Water Every Other Day/30 Days Discharge Instructions: May shower and wash wound with dial antibacterial soap and water prior to dressing change. Peri-Wound Care: Skin Prep (DME) (Generic) Every Other Day/30 Days Discharge Instructions: Use skin prep as directed Prim Dressing: Hydrofera Blue Ready Foam, 4x5 in (DME) (Generic) Every Other Day/30 Days ary Discharge Instructions: Apply to wound bed as instructed Prim Dressing: Santyl Ointment Every Other Day/30 Days ary Discharge Instructions: Apply ****IN CLINIC ONLY.**** Secondary Dressing: Zetuvit Plus Silicone Border Dressing 4x4 (in/in) (DME) (Generic) Every Other Day/30 Days Discharge Instructions: Apply silicone border over primary dressing as directed. 1. Hydrofera Blue every other day, and Santyl in office only 2. Offload the wound bed 3. Follow-up in 1 week Electronic Signature(s) Signed: 09/12/2021 3:08:23 PM By: Kalman Shan DO Entered By: Kalman Shan on 09/12/2021 15:07:22 -------------------------------------------------------------------------------- HxROS Details Patient Name: Date of Service: [name], [name] 09/12/2021 1:15 PM Medical Record Number: [id number] Patient Account Number: [id number] Date of Birth/Sex: Treating RN: 1946/05/09 (76 y.o. Laurie Hunter Primary Care Provider: [name] Other Clinician: Referring Provider: Treating Provider/Extender: Rhetta Mura Weeks in Treatment: 0 Information Obtained From Patient Eyes Complaints and  Symptoms: Positive for: Glasses / Contacts Medical History: Positive for: Cataracts - both eyes Respiratory Complaints and Symptoms: Positive for: Shortness of Breath - with exertion Endocrine Complaints and Symptoms: Negative for: Heat/cold intolerance Medical History: Positive for: Type II Diabetes Time with diabetes: 20+years Treated with: Oral agents Blood sugar tested every day: Yes Tested : Integumentary (Skin) Complaints and Symptoms: Positive for: Wounds - left leg Psychiatric Complaints and Symptoms: Negative for: Claustrophobia; Suicidal Constitutional Symptoms (General Health) Medical History: Past Medical History Notes: Morbid obesity Chronic kidney disease Stage 3 Hematologic/Lymphatic Medical History: Positive for: Anemia; Lymphedema - legs Past Medical History Notes: Hyperlipidemia Cardiovascular Medical History: Positive for: Hypertension Past Medical History Notes: Hypercholsterolemia Genitourinary Medical History: Past Medical History Notes: CKD stageIII Immunological Musculoskeletal Medical History: Positive for: Osteoarthritis Past Medical History Notes: Osteoporosis Gout Neurologic Medical History: Past Medical History Notes: CVA Left side hemiparesis Oncologic HBO Extended History Items Eyes: Cataracts Immunizations Pneumococcal Vaccine: Received Pneumococcal Vaccination: Yes Received Pneumococcal Vaccination On or After 60th Birthday: No Implantable Devices None Family and Social History Cancer: Yes; Diabetes: Yes; Heart Disease: Yes; Hypertension: Yes; Kidney Disease: Yes; Lung Disease: Yes; Stroke: Yes; Never smoker; Marital Status - Single; Alcohol Use: Never; Drug Use: No History; Caffeine Use: Daily; Financial Concerns: No; Food, Clothing or Shelter Needs: No; Support System Lacking: No; Transportation Concerns: Yes Electronic Signature(s) Signed: 09/12/2021 3:08:23 PM By: Kalman Shan DO Signed: 09/12/2021 3:24:27 PM  By: Deon Pilling RN, BSN Signed: 09/12/2021 3:41:54 PM By: Sharyn Creamer RN, BSN Previous Signature: 09/11/2021 2:51:55 PM Version By: Valeria Batman EMT Entered By: Sharyn Creamer on 09/12/2021 13:57:51 -------------------------------------------------------------------------------- SuperBill Details Patient Name: Date of Service: [name], [name] 09/12/2021 Medical Record Number: [id number] Patient Account Number: [id number] Date of Birth/Sex: Treating RN: [birth year] (75 y.o. Laurie Hunter Primary Care Provider: [name] Other Clinician: Referring Provider: Treating Provider/Extender: Rhetta Mura Weeks in  Treatment: 0 Diagnosis Coding ICD-10 Codes Code Description [phone] Non-pressure chronic ulcer of other part of left lower leg with fat layer exposed I89.0 Lymphedema, not elsewhere classified I63.9 Cerebral infarction, unspecified E11.622 Type 2 diabetes mellitus with other skin ulcer Facility Procedures CPT4 Code: [id number] Description: [id number] - WOUND CARE VISIT-LEV 3 EST PT Modifier: Quantity: 1 CPT4 Code: [id number] Description: [id number] - DEBRIDE W/O ANES NON SELECT Modifier: Quantity: 1 Physician Procedures : CPT4 Code Description Modifier [id number] - WC PHYS LEVEL 4 - NEW PT ICD-10 Diagnosis Description L97.822 Non-pressure chronic ulcer of other part of left lower leg with fat layer exposed I89.0 Lymphedema, not elsewhere classified I63.9 Cerebral  infarction, unspecified E11.622 Type 2 diabetes mellitus with other skin ulcer Quantity: 1 Electronic Signature(s) Signed: 09/12/2021 3:24:27 PM By: Deon Pilling RN, BSN Signed: 09/12/2021 3:53:03 PM By: Kalman Shan DO Previous Signature: 09/12/2021 3:08:23 PM Version By: Kalman Shan DO Entered By: Deon Pilling on 09/12/2021 15:13:38

## 2021-09-13 DIAGNOSIS — I890 Lymphedema, not elsewhere classified: Secondary | ICD-10-CM | POA: Diagnosis not present

## 2021-09-13 DIAGNOSIS — L97822 Non-pressure chronic ulcer of other part of left lower leg with fat layer exposed: Secondary | ICD-10-CM | POA: Diagnosis not present

## 2021-09-18 ENCOUNTER — Other Ambulatory Visit: Payer: Self-pay

## 2021-09-18 ENCOUNTER — Encounter (HOSPITAL_BASED_OUTPATIENT_CLINIC_OR_DEPARTMENT_OTHER): Payer: Medicare Other | Attending: Internal Medicine | Admitting: Internal Medicine

## 2021-09-18 DIAGNOSIS — M1990 Unspecified osteoarthritis, unspecified site: Secondary | ICD-10-CM | POA: Diagnosis not present

## 2021-09-18 DIAGNOSIS — L97822 Non-pressure chronic ulcer of other part of left lower leg with fat layer exposed: Secondary | ICD-10-CM | POA: Diagnosis not present

## 2021-09-18 DIAGNOSIS — I69354 Hemiplegia and hemiparesis following cerebral infarction affecting left non-dominant side: Secondary | ICD-10-CM | POA: Diagnosis not present

## 2021-09-18 DIAGNOSIS — E11622 Type 2 diabetes mellitus with other skin ulcer: Secondary | ICD-10-CM | POA: Insufficient documentation

## 2021-09-18 DIAGNOSIS — I890 Lymphedema, not elsewhere classified: Secondary | ICD-10-CM | POA: Diagnosis not present

## 2021-09-18 DIAGNOSIS — I639 Cerebral infarction, unspecified: Secondary | ICD-10-CM | POA: Diagnosis not present

## 2021-09-18 DIAGNOSIS — E1122 Type 2 diabetes mellitus with diabetic chronic kidney disease: Secondary | ICD-10-CM | POA: Diagnosis not present

## 2021-09-18 DIAGNOSIS — I129 Hypertensive chronic kidney disease with stage 1 through stage 4 chronic kidney disease, or unspecified chronic kidney disease: Secondary | ICD-10-CM | POA: Diagnosis not present

## 2021-09-18 DIAGNOSIS — N183 Chronic kidney disease, stage 3 unspecified: Secondary | ICD-10-CM | POA: Insufficient documentation

## 2021-09-18 DIAGNOSIS — E785 Hyperlipidemia, unspecified: Secondary | ICD-10-CM | POA: Insufficient documentation

## 2021-09-18 DIAGNOSIS — Z7984 Long term (current) use of oral hypoglycemic drugs: Secondary | ICD-10-CM | POA: Diagnosis not present

## 2021-09-18 NOTE — Progress Notes (Addendum)
MADONNA, TRATHEN ([id number]) Visit Report for 09/18/2021 Arrival Information Details Patient Name: Date of Service: [name], [name] 09/18/2021 2:45 PM Medical Record Number: [id number] Patient Account Number: [id number] Date of Birth/Sex: Treating RN: 03/13/1946 (76 y.o. [name] Primary Care Hardie Veltre: Georgann Housekeeper Other Clinician: Referring Allora Bains: Treating Justino Boze/Extender: Adline Mango in Treatment: 0 Visit Information History Since Last Visit Added or deleted any medications: No Patient Arrived: Wheel Chair Any new allergies or adverse reactions: No Arrival Time: 15:19 Had a fall or experienced change in No Accompanied By: Friend activities of daily living that may affect Transfer Assistance: Manual risk of falls: Patient Identification Verified: Yes Signs or symptoms of abuse/neglect since last visito No Secondary Verification Process Completed: Yes Hospitalized since last visit: No Patient Requires Transmission-Based Precautions: No Implantable device outside of the clinic excluding No Patient Has Alerts: Yes cellular tissue based products placed in the center Patient Alerts: Patient on Blood Thinner since last visit: Has Dressing in Place as Prescribed: Yes Pain Present Now: No Electronic Signature(s) Signed: 09/18/2021 4:42:24 PM By: Antonieta Iba Entered By: Antonieta Iba on 09/18/2021 15:21:09 -------------------------------------------------------------------------------- Encounter Discharge Information Details Patient Name: Date of Service: [name], [name] 09/18/2021 2:45 PM Medical Record Number: [id number] Patient Account Number: [id number] Date of Birth/Sex: Treating RN: June 09, 1946 (76 y.o. [name] Primary Care Joury Allcorn: Georgann Housekeeper Other Clinician: Referring [place] Millon: Treating Sundai Probert/Extender: Adline Mango in Treatment: 0 Encounter Discharge Information Items Post Procedure  Vitals Discharge Condition: Stable Temperature (F): 99.5 Ambulatory Status: Wheelchair Pulse (bpm): 120 Discharge Destination: Home Respiratory Rate (breaths/min): 20 Transportation: Private Auto Blood Pressure (mmHg): 179/98 Accompanied By: friend Schedule Follow-up Appointment: Yes Clinical Summary of Care: Provided on 09/18/2021 Form Type Recipient Paper Patient Patient Electronic Signature(s) Signed: 09/18/2021 4:42:24 PM By: Antonieta Iba Entered By: Antonieta Iba on 09/18/2021 15:51:11 -------------------------------------------------------------------------------- Lower Extremity Assessment Details Patient Name: Date of Service: [name], [name] 09/18/2021 2:45 PM Medical Record Number: [id number] Patient Account Number: [id number] Date of Birth/Sex: Treating RN: 06-14-1946 (76 y.o. [name] Primary Care Taksh Hjort: Georgann Housekeeper Other Clinician: Referring Kinston Magnan: Treating Laine Fonner/Extender: Orvan Seen Weeks in Treatment: 0 Electronic Signature(s) Signed: 09/18/2021 4:42:24 PM By: Antonieta Iba Entered By: Antonieta Iba on 09/18/2021 15:22:31 -------------------------------------------------------------------------------- Multi Wound Chart Details Patient Name: Date of Service: [name], [name] 09/18/2021 2:45 PM Medical Record Number: [id number] Patient Account Number: [id number] Date of Birth/Sex: Treating RN: 1945/10/10 (76 y.o. F) Primary Care Calab Sachse: Georgann Housekeeper Other Clinician: Referring Tyniya Kuyper: Treating Ora Mcnatt/Extender: Orvan Seen Weeks in Treatment: 0 Vital Signs Height(in): 58 Capillary Blood Glucose(mg/dl): 197 Weight(lbs): Pulse(bpm): 120 Body Mass Index(BMI): Blood Pressure(mmHg): 179/98 Temperature(F): 99.5 Respiratory Rate(breaths/min): 20 Photos: [N/A:N/A] Left, Posterior Lower Leg Left, Medial, Posterior Upper Leg N/A Wound Location: Shear/Friction Gradually Appeared  N/A Wounding Event: Venous Leg Ulcer Lymphedema N/A Primary Etiology: Cataracts, Anemia, Lymphedema, Cataracts, Anemia, Lymphedema, N/A Comorbid History: Hypertension, Type II Diabetes, Hypertension, Type II Diabetes, Osteoarthritis Osteoarthritis 08/16/2020 08/12/2021 N/A Date Acquired: 0 0 N/A Weeks of Treatment: Open Open N/A Wound Status: No No N/A Wound Recurrence: No Yes N/A Clustered Wound: N/A 2 N/A Clustered Quantity: 2.5x0.5x0.1 3x2.1x0.1 N/A Measurements L x W x D (cm) 0.982 4.948 N/A A (cm) : rea 0.098 0.495 N/A Volume (cm) : 88.90% -215.00% N/A % Reduction in Area: 88.90% -215.30% N/A % Reduction in Volume: Partial Thickness Full Thickness Without Exposed N/A Classification: Support Structures Medium  Medium N/A Exudate Amount: Serosanguineous Serosanguineous N/A Exudate Type: red, brown red, brown N/A Exudate Color: Distinct, outline attached Distinct, outline attached N/A Wound Margin: Large (67-100%) Large (67-100%) N/A Granulation Amount: Red Pink N/A Granulation Quality: None Present (0%) Small (1-33%) N/A Necrotic Amount: Fat Layer (Subcutaneous Tissue): Yes Fat Layer (Subcutaneous Tissue): Yes N/A Exposed Structures: Fascia: No Fascia: No Tendon: No Tendon: No Muscle: No Muscle: No Joint: No Joint: No Bone: No Bone: No Small (1-33%) None N/A Epithelialization: Chemical/Enzymatic/Mechanical Chemical/Enzymatic/Mechanical N/A Debridement: Pre-procedure Verification/Time Out 15:35 15:35 N/A Taken: N/A N/A N/A Instrument: Moderate None N/A Bleeding: Gel Foam N/A N/A Hemostasis Achieved: Procedure was tolerated well Procedure was tolerated well N/A Debridement Treatment Response: 2.5x0.5x0.1 3x2.1x0.1 N/A Post Debridement Measurements L x W x D (cm) 0.098 0.495 N/A Post Debridement Volume: (cm) Debridement Debridement N/A Procedures Performed: Treatment Notes Wound #1 (Lower Leg) Wound Laterality: Left,  Posterior Cleanser Soap and Water Discharge Instruction: May shower and wash wound with dial antibacterial soap and water prior to dressing change. Peri-Wound Care Skin Prep Discharge Instruction: Use skin prep as directed Topical Primary Dressing Hydrofera Blue Ready Foam, 4x5 in Discharge Instruction: Apply to wound bed as instructed Santyl Ointment Discharge Instruction: Apply ****IN CLINIC ONLY.**** Secondary Dressing Bordered Gauze, 4x4 in Discharge Instruction: Apply over primary dressing as directed. Secured With Compression Wrap Compression Stockings Add-Ons Wound #2 (Upper Leg) Wound Laterality: Left, Medial, Posterior Cleanser Soap and Water Discharge Instruction: May shower and wash wound with dial antibacterial soap and water prior to dressing change. Peri-Wound Care Skin Prep Discharge Instruction: Use skin prep as directed Topical Primary Dressing Hydrofera Blue Ready Foam, 4x5 in Discharge Instruction: Apply to wound bed as instructed Santyl Ointment Discharge Instruction: Apply ****IN CLINIC ONLY.**** Secondary Dressing Bordered Gauze, 2x3.75 in Discharge Instruction: Apply over primary dressing as directed. Secured With Compression Wrap Compression Stockings Facilities manager) Signed: 09/20/2021 4:46:58 PM By: Geralyn Corwin DO Entered By: Geralyn Corwin on 09/20/2021 16:43:39 -------------------------------------------------------------------------------- Multi-Disciplinary Care Plan Details Patient Name: Date of Service: [name], [name] 09/18/2021 2:45 PM Medical Record Number: [id number] Patient Account Number: [id number] Date of Birth/Sex: Treating RN: [birth year] (76 y.o. [name] Primary Care Vermell Madrid: Georgann Housekeeper Other Clinician: Referring Lovada Barwick: Treating Jonda Alanis/Extender: Adline Mango in Treatment: 0 Active Inactive Orientation to the Wound Care Program Nursing  Diagnoses: Knowledge deficit related to the wound healing center program Goals: Patient/caregiver will verbalize understanding of the Wound Healing Center Program Date Initiated: 09/12/2021 Target Resolution Date: 09/22/2021 Goal Status: Active Interventions: Provide education on orientation to the wound center Notes: Pain, Acute or Chronic Nursing Diagnoses: Pain, acute or chronic: actual or potential Potential alteration in comfort, pain Goals: Patient will verbalize adequate pain control and receive pain control interventions during procedures as needed Date Initiated: 09/12/2021 Target Resolution Date: 09/22/2021 Goal Status: Active Interventions: Encourage patient to take pain medications as prescribed Provide education on pain management Reposition patient for comfort Treatment Activities: Administer pain control measures as ordered : 09/12/2021 Notes: Wound/Skin Impairment Nursing Diagnoses: Knowledge deficit related to ulceration/compromised skin integrity Goals: Patient/caregiver will verbalize understanding of skin care regimen Date Initiated: 09/12/2021 Target Resolution Date: 09/22/2021 Goal Status: Active Interventions: Assess patient/caregiver ability to obtain necessary supplies Assess patient/caregiver ability to perform ulcer/skin care regimen upon admission and as needed Provide education on ulcer and skin care Treatment Activities: Skin care regimen initiated : 09/12/2021 Topical wound management initiated : 09/12/2021 Notes: Electronic Signature(s) Signed: 09/18/2021 4:42:24 PM By: Antonieta Iba  Entered By: Antonieta IbaBarnhart, Jodi on 09/18/2021 15:29:41 -------------------------------------------------------------------------------- Pain Assessment Details Patient Name: Date of Service: [name], [name] 09/18/2021 2:45 PM Medical Record Number: [id number] Patient Account Number: [id number] Date of Birth/Sex: Treating RN: August 26, 1945 (76 y.o. Roel CluckF) Barnhart,  Jodi Primary Care Caty Tessler: Georgann HousekeeperHusain, Karrar Other Clinician: Referring Elianny Buxbaum: Treating Gergory Biello/Extender: Orvan SeenHoffman, Jessica Husain, Karrar Weeks in Treatment: 0 Active Problems Location of Pain Severity and Description of Pain Patient Has Paino No Site Locations Pain Management and Medication Current Pain Management: Electronic Signature(s) Signed: 09/18/2021 4:42:24 PM By: Antonieta IbaBarnhart, Jodi Entered By: Antonieta IbaBarnhart, Jodi on 09/18/2021 15:22:24 -------------------------------------------------------------------------------- Patient/Caregiver Education Details Patient Name: Date of Service: [name], [name] 3/6/2023andnbsp2:45 PM Medical Record Number: [id number] Patient Account Number: [id number] Date of Birth/Gender: Treating RN: August 26, 1945 (76 y.o. Roel CluckF) Barnhart, Jodi Primary Care Physician: Georgann HousekeeperHusain, Karrar Other Clinician: Referring Physician: Treating Physician/Extender: Adline MangoHoffman, Jessica Husain, Karrar Weeks in Treatment: 0 Education Assessment Education Provided To: Patient Education Topics Provided Wound/Skin Impairment: Methods: Explain/Verbal, Printed Responses: State content correctly Electronic Signature(s) Signed: 09/18/2021 4:42:24 PM By: Antonieta IbaBarnhart, Jodi Entered By: Antonieta IbaBarnhart, Jodi on 09/18/2021 15:30:22 -------------------------------------------------------------------------------- Wound Assessment Details Patient Name: Date of Service: [name], [name] 09/18/2021 2:45 PM Medical Record Number: [id number] Patient Account Number: [id number] Date of Birth/Sex: Treating RN: August 26, 1945 (76 y.o. Roel CluckF) Barnhart, Jodi Primary Care Lailana Shira: Georgann HousekeeperHusain, Karrar Other Clinician: Referring Benny Henrie: Treating Demarkus Remmel/Extender: Orvan SeenHoffman, Jessica Husain, Karrar Weeks in Treatment: 0 Wound Status Wound Number: 1 Primary Venous Leg Ulcer Etiology: Wound Location: Left, Posterior Lower Leg Wound Open Wounding Event: Shear/Friction Status: Date Acquired: 08/16/2020 Comorbid  Cataracts, Anemia, Lymphedema, Hypertension, Type II Weeks Of Treatment: 0 History: Diabetes, Osteoarthritis Clustered Wound: No Photos Wound Measurements Length: (cm) 2.5 Width: (cm) 0.5 Depth: (cm) 0.1 Area: (cm) 0.982 Volume: (cm) 0.098 % Reduction in Area: 88.9% % Reduction in Volume: 88.9% Epithelialization: Small (1-33%) Tunneling: No Undermining: No Wound Description Classification: Partial Thickness Wound Margin: Distinct, outline attached Exudate Amount: Medium Exudate Type: Serosanguineous Exudate Color: red, brown Foul Odor After Cleansing: No Slough/Fibrino No Wound Bed Granulation Amount: Large (67-100%) Exposed Structure Granulation Quality: Red Fascia Exposed: No Necrotic Amount: None Present (0%) Fat Layer (Subcutaneous Tissue) Exposed: Yes Tendon Exposed: No Muscle Exposed: No Joint Exposed: No Bone Exposed: No Treatment Notes Wound #1 (Lower Leg) Wound Laterality: Left, Posterior Cleanser Soap and Water Discharge Instruction: May shower and wash wound with dial antibacterial soap and water prior to dressing change. Peri-Wound Care Skin Prep Discharge Instruction: Use skin prep as directed Topical Primary Dressing Hydrofera Blue Ready Foam, 4x5 in Discharge Instruction: Apply to wound bed as instructed Santyl Ointment Discharge Instruction: Apply ****IN CLINIC ONLY.**** Secondary Dressing Bordered Gauze, 4x4 in Discharge Instruction: Apply over primary dressing as directed. Secured With Compression Wrap Compression Stockings Facilities managerAdd-Ons Electronic Signature(s) Signed: 09/18/2021 4:42:24 PM By: Antonieta IbaBarnhart, Jodi Entered By: Antonieta IbaBarnhart, Jodi on 09/18/2021 15:27:01 -------------------------------------------------------------------------------- Wound Assessment Details Patient Name: Date of Service: [name], [name] 09/18/2021 2:45 PM Medical Record Number: [id number] Patient Account Number: [id number] Date of Birth/Sex: Treating RN: August 26, 1945  (76 y.o. Roel CluckF) Barnhart, Jodi Primary Care Mikhail Hallenbeck: Georgann HousekeeperHusain, Karrar Other Clinician: Referring Elvie Maines: Treating Yoshiko Keleher/Extender: Orvan SeenHoffman, Jessica Husain, Karrar Weeks in Treatment: 0 Wound Status Wound Number: 2 Primary Lymphedema Etiology: Wound Location: Left, Medial, Posterior Upper Leg Wound Open Wounding Event: Gradually Appeared Status: Date Acquired: 08/12/2021 Comorbid Cataracts, Anemia, Lymphedema, Hypertension, Type II Weeks Of Treatment: 0 History: Diabetes, Osteoarthritis Clustered Wound: Yes Photos Wound Measurements Length: (cm) 3 Width: (  cm) 2.1 Depth: (cm) 0.1 Clustered Quantity: 2 Area: (cm) 4.948 Volume: (cm) 0.495 % Reduction in Area: -215% % Reduction in Volume: -215.3% Epithelialization: None Wound Description Classification: Full Thickness Without Exposed Support Stru Wound Margin: Distinct, outline attached Exudate Amount: Medium Exudate Type: Serosanguineous Exudate Color: red, brown ctures Foul Odor After Cleansing: No Slough/Fibrino Yes Wound Bed Granulation Amount: Large (67-100%) Exposed Structure Granulation Quality: Pink Fascia Exposed: No Necrotic Amount: Small (1-33%) Fat Layer (Subcutaneous Tissue) Exposed: Yes Necrotic Quality: Adherent Slough Tendon Exposed: No Muscle Exposed: No Joint Exposed: No Bone Exposed: No Treatment Notes Wound #2 (Upper Leg) Wound Laterality: Left, Medial, Posterior Cleanser Soap and Water Discharge Instruction: May shower and wash wound with dial antibacterial soap and water prior to dressing change. Peri-Wound Care Skin Prep Discharge Instruction: Use skin prep as directed Topical Primary Dressing Hydrofera Blue Ready Foam, 4x5 in Discharge Instruction: Apply to wound bed as instructed Santyl Ointment Discharge Instruction: Apply ****IN CLINIC ONLY.**** Secondary Dressing Bordered Gauze, 2x3.75 in Discharge Instruction: Apply over primary dressing as directed. Secured With Compression  Wrap Compression Stockings Facilities manager) Signed: 09/18/2021 4:42:24 PM By: Antonieta Iba Entered By: Antonieta Iba on 09/18/2021 15:28:18 -------------------------------------------------------------------------------- Vitals Details Patient Name: Date of Service: [name], [name] 09/18/2021 2:45 PM Medical Record Number: [id number] Patient Account Number: [id number] Date of Birth/Sex: Treating RN: [birth year] (76 y.o. [name] Primary Care Abena Erdman: Georgann Housekeeper Other Clinician: Referring Aleisa Howk: Treating Elzie Knisley/Extender: Orvan Seen Weeks in Treatment: 0 Vital Signs Time Taken: 15:21 Temperature (F): 99.5 Height (in): 58 Pulse (bpm): 120 Respiratory Rate (breaths/min): 20 Blood Pressure (mmHg): 179/98 Capillary Blood Glucose (mg/dl): 191 Reference Range: 80 - 120 mg / dl Electronic Signature(s) Signed: 09/18/2021 4:42:24 PM By: Antonieta Iba Entered By: Antonieta Iba on 09/18/2021 15:21:47

## 2021-09-20 NOTE — Progress Notes (Signed)
Laurie Hunter, Laurie Hunter ([id number]) Visit Report for 09/18/2021 Chief Complaint Document Details Patient Name: Date of Service: [name], [name] 09/18/2021 2:45 PM Medical Record Number: [id number] Patient Account Number: [id number] Date of Birth/Sex: Treating RN: Aug 28, 1945 (76 y.o. F) Primary Care Provider: [name] Other Clinician: Referring Provider: Treating Provider/Extender: [name] in Treatment: 0 Information Obtained from: Patient Chief Complaint Left lower extremity wound Electronic Signature(s) Signed: 09/20/2021 4:46:58 PM By: Kalman Shan DO Entered By: Kalman Shan on 09/20/2021 16:43:47 -------------------------------------------------------------------------------- Debridement Details Patient Name: Date of Service: [name], [name] 09/18/2021 2:45 PM Medical Record Number: [id number] Patient Account Number: [id number] Date of Birth/Sex: Treating RN: [birth year] (76 y.o. Laurie Hunter Primary Care Provider: [name] Other Clinician: Referring Provider: Treating Provider/Extender: [name] in Treatment: 0 Debridement Performed for Assessment: Wound #1 Left,Posterior Lower Leg Performed By: Physician Kalman Shan, DO Debridement Type: Chemical/Enzymatic/Mechanical Agent Used: Santyl Severity of Tissue Pre Debridement: Fat layer exposed Level of Consciousness (Pre-procedure): Awake and Alert Pre-procedure Verification/Time Out Yes - 15:35 Taken: Start Time: 15:36 Bleeding: Moderate Hemostasis Achieved: Gel Foam End Time: 15:40 Response to Treatment: Procedure was tolerated well Level of Consciousness (Post- Awake and Alert procedure): Post Debridement Measurements of Total Wound Length: (cm) 2.5 Width: (cm) 0.5 Depth: (cm) 0.1 Volume: (cm) 0.098 Character of Wound/Ulcer Post Debridement: Stable Severity of Tissue Post Debridement: Fat layer exposed Post Procedure Diagnosis Same  as Pre-procedure Electronic Signature(s) Signed: 09/18/2021 4:42:24 PM By: Lorrin Jackson Signed: 09/20/2021 4:46:58 PM By: Kalman Shan DO Entered By: Lorrin Jackson on 09/18/2021 15:49:00 -------------------------------------------------------------------------------- Debridement Details Patient Name: Date of Service: [name], [name] 09/18/2021 2:45 PM Medical Record Number: [id number] Patient Account Number: [id number] Date of Birth/Sex: Treating RN: [birth year] (76 y.o. Laurie Hunter Primary Care Provider: [name] Other Clinician: Referring Provider: Treating Provider/Extender: Rhetta Mura Weeks in Treatment: 0 Debridement Performed for Assessment: Wound #2 Left,Medial,Posterior Upper Leg Performed By: Physician Kalman Shan, DO Debridement Type: Chemical/Enzymatic/Mechanical Agent Used: Santyl Level of Consciousness (Pre-procedure): Awake and Alert Pre-procedure Verification/Time Out Yes - 15:35 Taken: Start Time: 15:36 Bleeding: None End Time: 15:40 Response to Treatment: Procedure was tolerated well Level of Consciousness (Post- Awake and Alert procedure): Post Debridement Measurements of Total Wound Length: (cm) 3 Width: (cm) 2.1 Depth: (cm) 0.1 Volume: (cm) 0.495 Character of Wound/Ulcer Post Debridement: Stable Post Procedure Diagnosis Same as Pre-procedure Electronic Signature(s) Signed: 09/18/2021 4:42:24 PM By: Lorrin Jackson Signed: 09/20/2021 4:46:58 PM By: Kalman Shan DO Entered By: Lorrin Jackson on 09/18/2021 15:49:34 -------------------------------------------------------------------------------- HPI Details Patient Name: Date of Service: [name], [name] 09/18/2021 2:45 PM Medical Record Number: [id number] Patient Account Number: [id number] Date of Birth/Sex: Treating RN: 1946-02-05 (76 y.o. F) Primary Care Provider: [name] Other Clinician: Referring Provider: Treating Provider/Extender: Rhetta Mura Weeks in Treatment: 0 History of Present Illness HPI Description: Admission 09/12/2021 Ms. Laurie Hunter is a 76 year old female with a past medical history of type 2 diabetes on oral agents, CVA with left-sided weakness that presents to the clinic for a 1 to 61-month history of wound to her left posterior leg. She states that this started from friction from the mattress she lays on in the chair she sits in. She is not accommodated to help relieve the pressure. She has been followed by her primary care physician for this issue and been treated with 2 rounds of doxycycline. She reports improvement in drainage  after taking antibiotics. She currently denies signs of infection. She currently keeps the area covered. 3/8; patient presents for follow-up. She has been using Hydrofera Blue with no issues. She denies signs of infection. Electronic Signature(s) Signed: 09/20/2021 4:46:58 PM By: Kalman Shan DO Entered By: Kalman Shan on 09/20/2021 16:44:12 -------------------------------------------------------------------------------- Physical Exam Details Patient Name: Date of Service: [name], [name] 09/18/2021 2:45 PM Medical Record Number: [id number] Patient Account Number: [id number] Date of Birth/Sex: Treating RN: 1945/12/22 (76 y.o. F) Primary Care Provider: [name] Other Clinician: Referring Provider: Treating Provider/Extender: Rhetta Mura Weeks in Treatment: 0 Constitutional respirations regular, non-labored and within target range for patient.Marland Kitchen Psychiatric pleasant and cooperative. Notes Left lower extremity: T the posterior thigh there is a large open wound with granulation tissue And scant nonviable tissue present. There are 2 smaller wounds o adjacent to this with granulation tissue present. All of this is located on a significant fat/Lymphedema fold. Electronic Signature(s) Signed: 09/20/2021 4:46:58 PM By: Kalman Shan  DO Entered By: Kalman Shan on 09/20/2021 16:44:54 -------------------------------------------------------------------------------- Physician Orders Details Patient Name: Date of Service: [name], [name] 09/18/2021 2:45 PM Medical Record Number: [id number] Patient Account Number: [id number] Date of Birth/Sex: Treating RN: 26-Sep-1945 (76 y.o. Laurie Hunter Primary Care Provider: [name] Other Clinician: Referring Provider: Treating Provider/Extender: [name] in Treatment: 0 Verbal / Phone Orders: No Diagnosis Coding ICD-10 Coding Code Description [phone] Non-pressure chronic ulcer of other part of left lower leg with fat layer exposed I89.0 Lymphedema, not elsewhere classified I63.9 Cerebral infarction, unspecified E11.622 Type 2 diabetes mellitus with other skin ulcer Follow-up Appointments ppointment in 2 weeks. - with Dr. Heber [place] [facility], Room 7) Return A Bathing/ Shower/ Hygiene May shower and wash wound with soap and water. Off-Loading Other: - ensure no pressure or friction/shear to area to aid in wound healing. Wound Treatment Wound #1 - Lower Leg Wound Laterality: Left, Posterior Cleanser: Soap and Water Every Other Day/30 Days Discharge Instructions: May shower and wash wound with dial antibacterial soap and water prior to dressing change. Peri-Wound Care: Skin Prep (Generic) Every Other Day/30 Days Discharge Instructions: Use skin prep as directed Prim Dressing: Hydrofera Blue Ready Foam, 4x5 in (Generic) Every Other Day/30 Days ary Discharge Instructions: Apply to wound bed as instructed Prim Dressing: Santyl Ointment Every Other Day/30 Days ary Discharge Instructions: Apply ****IN CLINIC ONLY.**** Secondary Dressing: Bordered Gauze, 4x4 in (Generic) Every Other Day/30 Days Discharge Instructions: Apply over primary dressing as directed. Wound #2 - Upper Leg Wound Laterality: Left, Medial, Posterior Cleanser: Soap and  Water Every Other Day/30 Days Discharge Instructions: May shower and wash wound with dial antibacterial soap and water prior to dressing change. Peri-Wound Care: Skin Prep (Generic) Every Other Day/30 Days Discharge Instructions: Use skin prep as directed Prim Dressing: Hydrofera Blue Ready Foam, 4x5 in (Generic) Every Other Day/30 Days ary Discharge Instructions: Apply to wound bed as instructed Prim Dressing: Santyl Ointment Every Other Day/30 Days ary Discharge Instructions: Apply ****IN CLINIC ONLY.**** Secondary Dressing: Bordered Gauze, 2x3.75 in (Generic) Every Other Day/30 Days Discharge Instructions: Apply over primary dressing as directed. Electronic Signature(s) Signed: 09/20/2021 4:46:58 PM By: Kalman Shan DO Previous Signature: 09/18/2021 4:42:24 PM Version By: Lorrin Jackson Entered By: Kalman Shan on 09/20/2021 16:45:17 -------------------------------------------------------------------------------- Problem List Details Patient Name: Date of Service: [name], [name] 09/18/2021 2:45 PM Medical Record Number: [id number] Patient Account Number: [id number] Date of Birth/Sex: Treating RN: [birth year] (76 y.o.  Laurie Hunter Primary Care Provider: [name] Other Clinician: Referring Provider: Treating Provider/Extender: [name] in Treatment: 0 Active Problems ICD-10 Encounter Code Description Active Date MDM Diagnosis [phone] Non-pressure chronic ulcer of other part of left lower leg with fat layer exposed2/28/2023 No Yes I89.0 Lymphedema, not elsewhere classified 09/12/2021 No Yes I63.9 Cerebral infarction, unspecified 09/12/2021 No Yes E11.622 Type 2 diabetes mellitus with other skin ulcer 09/12/2021 No Yes Inactive Problems Resolved Problems Electronic Signature(s) Signed: 09/20/2021 4:46:58 PM By: Kalman Shan DO Previous Signature: 09/18/2021 4:42:24 PM Version By: Lorrin Jackson Entered By: Kalman Shan on  09/20/2021 16:43:33 -------------------------------------------------------------------------------- Progress Note Details Patient Name: Date of Service: [name], [name] 09/18/2021 2:45 PM Medical Record Number: [id number] Patient Account Number: [id number] Date of Birth/Sex: Treating RN: [birth year] (76 y.o. F) Primary Care Provider: [name] Other Clinician: Referring Provider: Treating Provider/Extender: [name] in Treatment: 0 Subjective Chief Complaint Information obtained from Patient Left lower extremity wound History of Present Illness (HPI) Admission 09/12/2021 Ms. Argentina Stadtler is a 76 year old female with a past medical history of type 2 diabetes on oral agents, CVA with left-sided weakness that presents to the clinic for a 1 to 14-month history of wound to her left posterior leg. She states that this started from friction from the mattress she lays on in the chair she sits in. She is not accommodated to help relieve the pressure. She has been followed by her primary care physician for this issue and been treated with 2 rounds of doxycycline. She reports improvement in drainage after taking antibiotics. She currently denies signs of infection. She currently keeps the area covered. 3/8; patient presents for follow-up. She has been using Hydrofera Blue with no issues. She denies signs of infection. Patient History Information obtained from Patient. Family History Cancer, Diabetes, Heart Disease, Hypertension, Kidney Disease, Lung Disease, Stroke. Social History Never smoker, Marital Status - Single, Alcohol Use - Never, Drug Use - No History, Caffeine Use - Daily. Medical History Eyes Patient has history of Cataracts - both eyes Hematologic/Lymphatic Patient has history of Anemia, Lymphedema - legs Cardiovascular Patient has history of Hypertension Endocrine Patient has history of Type II Diabetes Musculoskeletal Patient has history of  Osteoarthritis Medical A Surgical History Notes nd Constitutional Symptoms (General Health) Morbid obesity Chronic kidney disease Stage 3 Hematologic/Lymphatic Hyperlipidemia Cardiovascular Hypercholsterolemia Genitourinary CKD stageIII Musculoskeletal Osteoporosis Gout Neurologic CVA Left side hemiparesis Objective Constitutional respirations regular, non-labored and within target range for patient.. Vitals Time Taken: 3:21 PM, Height: 58 in, Temperature: 99.5 F, Pulse: 120 bpm, Respiratory Rate: 20 breaths/min, Blood Pressure: 179/98 mmHg, Capillary Blood Glucose: 102 mg/dl. Psychiatric pleasant and cooperative. General Notes: Left lower extremity: T the posterior thigh there is a large open wound with granulation tissue And scant nonviable tissue present. There are 2 o smaller wounds adjacent to this with granulation tissue present. All of this is located on a significant fat/Lymphedema fold. Integumentary (Hair, Skin) Wound #1 status is Open. Original cause of wound was Shear/Friction. The date acquired was: 08/16/2020. The wound is located on the Left,Posterior Lower Leg. The wound measures 2.5cm length x 0.5cm width x 0.1cm depth; 0.982cm^2 area and 0.098cm^3 volume. There is Fat Layer (Subcutaneous Tissue) exposed. There is no tunneling or undermining noted. There is a medium amount of serosanguineous drainage noted. The wound margin is distinct with the outline attached to the wound base. There is large (67-100%) red granulation within the wound bed. There is no  necrotic tissue within the wound bed. Wound #2 status is Open. Original cause of wound was Gradually Appeared. The date acquired was: 08/12/2021. The wound is located on the Left,Medial,Posterior Upper Leg. The wound measures 3cm length x 2.1cm width x 0.1cm depth; 4.948cm^2 area and 0.495cm^3 volume. There is Fat Layer (Subcutaneous Tissue) exposed. There is a medium amount of serosanguineous drainage noted. The wound  margin is distinct with the outline attached to the wound base. There is large (67-100%) pink granulation within the wound bed. There is a small (1-33%) amount of necrotic tissue within the wound bed including Adherent Slough. Assessment Active Problems ICD-10 Non-pressure chronic ulcer of other part of left lower leg with fat layer exposed Lymphedema, not elsewhere classified Cerebral infarction, unspecified Type 2 diabetes mellitus with other skin ulcer Patient's wounds are stable with slight improvement in appearance. There is more granulation tissue present. No signs of surrounding infection.I recommended continuing Hydrofera Blue. Follow-up in 2 weeks. Procedures Wound #1 Pre-procedure diagnosis of Wound #1 is a Venous Leg Ulcer located on the Left,Posterior Lower Leg .Severity of Tissue Pre Debridement is: Fat layer exposed. There was a Chemical/Enzymatic/Mechanical debridement performed by Kalman Shan, DO.Marland Kitchen Agent used was [employer]. A time out was conducted at 15:35, prior to the start of the procedure. A Moderate amount of bleeding was controlled with Gel Foam. The procedure was tolerated well. Post Debridement Measurements: 2.5cm length x 0.5cm width x 0.1cm depth; 0.098cm^3 volume. Character of Wound/Ulcer Post Debridement is stable. Severity of Tissue Post Debridement is: Fat layer exposed. Post procedure Diagnosis Wound #1: Same as Pre-Procedure Wound #2 Pre-procedure diagnosis of Wound #2 is a Lymphedema located on the Left,Medial,Posterior Upper Leg . There was a Chemical/Enzymatic/Mechanical debridement performed by Kalman Shan, DO.Marland Kitchen Agent used was [employer]. A time out was conducted at 15:35, prior to the start of the procedure. There was no bleeding. The procedure was tolerated well. Post Debridement Measurements: 3cm length x 2.1cm width x 0.1cm depth; 0.495cm^3 volume. Character of Wound/Ulcer Post Debridement is stable. Post procedure Diagnosis Wound #2: Same as  Pre-Procedure Plan Follow-up Appointments: Return Appointment in 2 weeks. - with Dr. Heber [place] [facility], Room 7) Bathing/ Shower/ Hygiene: May shower and wash wound with soap and water. Off-Loading: Other: - ensure no pressure or friction/shear to area to aid in wound healing. WOUND #1: - Lower Leg Wound Laterality: Left, Posterior Cleanser: Soap and Water Every Other Day/30 Days Discharge Instructions: May shower and wash wound with dial antibacterial soap and water prior to dressing change. Peri-Wound Care: Skin Prep (Generic) Every Other Day/30 Days Discharge Instructions: Use skin prep as directed Prim Dressing: Hydrofera Blue Ready Foam, 4x5 in (Generic) Every Other Day/30 Days ary Discharge Instructions: Apply to wound bed as instructed Prim Dressing: Santyl Ointment Every Other Day/30 Days ary Discharge Instructions: Apply ****IN CLINIC ONLY.**** Secondary Dressing: Bordered Gauze, 4x4 in (Generic) Every Other Day/30 Days Discharge Instructions: Apply over primary dressing as directed. WOUND #2: - Upper Leg Wound Laterality: Left, Medial, Posterior Cleanser: Soap and Water Every Other Day/30 Days Discharge Instructions: May shower and wash wound with dial antibacterial soap and water prior to dressing change. Peri-Wound Care: Skin Prep (Generic) Every Other Day/30 Days Discharge Instructions: Use skin prep as directed Prim Dressing: Hydrofera Blue Ready Foam, 4x5 in (Generic) Every Other Day/30 Days ary Discharge Instructions: Apply to wound bed as instructed Prim Dressing: Santyl Ointment Every Other Day/30 Days ary Discharge Instructions: Apply ****IN CLINIC ONLY.**** Secondary Dressing: Bordered Gauze, 2x3.75 in (Generic) Every  Other Day/30 Days Discharge Instructions: Apply over primary dressing as directed. 1. Hydrofera Blue 2. Follow-up in 2 weeks Electronic Signature(s) Signed: 09/20/2021 4:46:58 PM By: Kalman Shan DO Entered By: Kalman Shan on 09/20/2021  16:46:16 -------------------------------------------------------------------------------- HxROS Details Patient Name: Date of Service: [name], [name] 09/18/2021 2:45 PM Medical Record Number: [id number] Patient Account Number: [id number] Date of Birth/Sex: Treating RN: 11-30-1945 (76 y.o. F) Primary Care Provider: [name] Other Clinician: Referring Provider: Treating Provider/Extender: [name] in Treatment: 0 Information Obtained From Patient Constitutional Symptoms (General Health) Medical History: Past Medical History Notes: Morbid obesity Chronic kidney disease Stage 3 Eyes Medical History: Positive for: Cataracts - both eyes Hematologic/Lymphatic Medical History: Positive for: Anemia; Lymphedema - legs Past Medical History Notes: Hyperlipidemia Cardiovascular Medical History: Positive for: Hypertension Past Medical History Notes: Hypercholsterolemia Endocrine Medical History: Positive for: Type II Diabetes Time with diabetes: 20+years Treated with: Oral agents Blood sugar tested every day: Yes Tested : Genitourinary Medical History: Past Medical History Notes: CKD stageIII Musculoskeletal Medical History: Positive for: Osteoarthritis Past Medical History Notes: Osteoporosis Gout Neurologic Medical History: Past Medical History Notes: CVA Left side hemiparesis HBO Extended History Items Eyes: Cataracts Immunizations Pneumococcal Vaccine: Received Pneumococcal Vaccination: Yes Received Pneumococcal Vaccination On or After 60th Birthday: No Implantable Devices None Family and Social History Cancer: Yes; Diabetes: Yes; Heart Disease: Yes; Hypertension: Yes; Kidney Disease: Yes; Lung Disease: Yes; Stroke: Yes; Never smoker; Marital Status - Single; Alcohol Use: Never; Drug Use: No History; Caffeine Use: Daily; Financial Concerns: No; Food, Clothing or Shelter Needs: No; Support System Lacking: No; Transportation  Concerns: Yes Electronic Signature(s) Signed: 09/20/2021 4:46:58 PM By: Kalman Shan DO Entered By: Kalman Shan on 09/20/2021 16:44:19 -------------------------------------------------------------------------------- SuperBill Details Patient Name: Date of Service: [name], [name] 09/18/2021 Medical Record Number: [id number] Patient Account Number: [id number] Date of Birth/Sex: Treating RN: 08-Mar-1946 (76 y.o. Laurie Hunter Primary Care Provider: [name] Other Clinician: Referring Provider: Treating Provider/Extender: Rhetta Mura Weeks in Treatment: 0 Diagnosis Coding ICD-10 Codes Code Description [phone] Non-pressure chronic ulcer of other part of left lower leg with fat layer exposed I89.0 Lymphedema, not elsewhere classified I63.9 Cerebral infarction, unspecified E11.622 Type 2 diabetes mellitus with other skin ulcer Facility Procedures CPT4 Code: [id number] Description: [phone] - DEBRIDE W/O ANES NON SELECT ICD-10 Diagnosis Description L97.822 Non-pressure chronic ulcer of other part of left lower leg with fat layer expos Modifier: ed Quantity: 1 Physician Procedures : CPT4 Code Description Modifier [id number] - WC PHYS LEVEL 3 - EST PT ICD-10 Diagnosis Description L97.822 Non-pressure chronic ulcer of other part of left lower leg with fat layer exposed I89.0 Lymphedema, not elsewhere classified I63.9 Cerebral  infarction, unspecified E11.622 Type 2 diabetes mellitus with other skin ulcer Quantity: 1 Electronic Signature(s) Signed: 09/20/2021 4:46:58 PM By: Kalman Shan DO Previous Signature: 09/18/2021 4:42:24 PM Version By: Lorrin Jackson Entered By: Kalman Shan on 09/20/2021 16:46:42

## 2021-09-27 DIAGNOSIS — I10 Essential (primary) hypertension: Secondary | ICD-10-CM | POA: Diagnosis not present

## 2021-09-27 DIAGNOSIS — E1122 Type 2 diabetes mellitus with diabetic chronic kidney disease: Secondary | ICD-10-CM | POA: Diagnosis not present

## 2021-09-27 DIAGNOSIS — M810 Age-related osteoporosis without current pathological fracture: Secondary | ICD-10-CM | POA: Diagnosis not present

## 2021-09-27 DIAGNOSIS — E782 Mixed hyperlipidemia: Secondary | ICD-10-CM | POA: Diagnosis not present

## 2021-10-02 ENCOUNTER — Other Ambulatory Visit: Payer: Self-pay

## 2021-10-02 ENCOUNTER — Encounter (HOSPITAL_BASED_OUTPATIENT_CLINIC_OR_DEPARTMENT_OTHER): Payer: Medicare Other | Admitting: Internal Medicine

## 2021-10-02 DIAGNOSIS — I639 Cerebral infarction, unspecified: Secondary | ICD-10-CM

## 2021-10-02 DIAGNOSIS — L97822 Non-pressure chronic ulcer of other part of left lower leg with fat layer exposed: Secondary | ICD-10-CM

## 2021-10-02 DIAGNOSIS — I890 Lymphedema, not elsewhere classified: Secondary | ICD-10-CM

## 2021-10-02 DIAGNOSIS — E11622 Type 2 diabetes mellitus with other skin ulcer: Secondary | ICD-10-CM | POA: Diagnosis not present

## 2021-10-02 NOTE — Progress Notes (Signed)
MACKENNA, SERRETTE ([id number]) ?Visit Report for 10/02/2021 ?Chief Complaint Document Details ?Patient Name: Date of Service: ?[name], [name] 10/02/2021 2:45 PM ?Medical Record Number: [id number] ?Patient Account Number: [id number] ?Date of Birth/Sex: Treating RN: ?1946/03/04 (76 y.o. F) ?Primary Care Provider: [name] Other Clinician: ?Referring Provider: ?Treating Provider/Extender: Kalman Shan ?[name] ?Weeks in Treatment: 2 ?Information Obtained from: Patient ?Chief Complaint ?Left lower extremity wound ?Electronic Signature(s) ?Signed: 10/02/2021 3:41:00 PM By: Kalman Shan DO ?Entered By: Kalman Shan on 10/02/2021 15:37:29 ?-------------------------------------------------------------------------------- ?Debridement Details ?Patient Name: Date of Service: ?[name], [name] 10/02/2021 2:45 PM ?Medical Record Number: [id number] ?Patient Account Number: [id number] ?Date of Birth/Sex: Treating RN: ?[birth year] (76 y.o. [name] ?Primary Care Provider: [name] Other Clinician: ?Referring Provider: ?Treating Provider/Extender: Kalman Shan ?[name] ?Weeks in Treatment: 2 ?Debridement Performed for Assessment: Wound #1 Left,Posterior Lower Leg ?Performed By: Physician Kalman Shan, DO ?Debridement Type: Chemical/Enzymatic/Mechanical ?Agent Used: Santyl ?Severity of Tissue Pre Debridement: Fat layer exposed ?Level of Consciousness (Pre-procedure): Awake and Alert ?Pre-procedure Verification/Time Out Yes - 15:00 ?Taken: ?Start Time: 15:01 ?Bleeding: None ?End Time: 15:04 ?Response to Treatment: Procedure was tolerated well ?Level of Consciousness (Post- Awake and Alert ?procedure): ?Post Debridement Measurements of Total Wound ?Length: (cm) 1.5 ?Width: (cm) 0.7 ?Depth: (cm) 0.1 ?Volume: (cm?) 0.082 ?Character of Wound/Ulcer Post Debridement: Stable ?Severity of Tissue Post Debridement: Fat layer exposed ?Post Procedure Diagnosis ?Same as Pre-procedure ?Electronic  Signature(s) ?Signed: 10/02/2021 3:41:00 PM By: Kalman Shan DO ?Signed: 10/02/2021 4:26:43 PM By: Lorrin Jackson ?Entered By: Lorrin Jackson on 10/02/2021 15:07:47 ?-------------------------------------------------------------------------------- ?HPI Details ?Patient Name: Date of Service: ?[name], [name] 10/02/2021 2:45 PM ?Medical Record Number: [id number] ?Patient Account Number: [id number] ?Date of Birth/Sex: Treating RN: ?10/07/1945 (76 y.o. F) ?Primary Care Provider: [name] Other Clinician: ?Referring Provider: ?Treating Provider/Extender: Kalman Shan ?[name] ?Weeks in Treatment: 2 ?History of Present Illness ?HPI Description: Admission 09/12/2021 ?Ms. Chelsy Choudhury is a 76 year old female with a past medical history of type 2 diabetes on oral agents, CVA with left-sided weakness that presents to the ?clinic for a 1 to 15-month history of wound to her left posterior leg. She states that this started from friction from the mattress she lays on in the chair she sits ?in. She is not accommodated to help relieve the pressure. She has been followed by her primary care physician for this issue and been treated with 2 rounds ?of doxycycline. She reports improvement in drainage after taking antibiotics. She currently denies signs of infection. She currently keeps the area covered. ?3/8; patient presents for follow-up. She has been using Hydrofera Blue with no issues. She denies signs of infection. ?3/20; patient presents for follow-up. She continues to use [facility] with no issues. She has no questions or concerns today. ?Electronic Signature(s) ?Signed: 10/02/2021 3:41:00 PM By: Kalman Shan DO ?Entered By: Kalman Shan on 10/02/2021 15:37:55 ?-------------------------------------------------------------------------------- ?Physical Exam Details ?Patient Name: Date of Service: ?[name], [name] 10/02/2021 2:45 PM ?Medical Record Number: [id number] ?Patient Account Number:  [id number] ?Date of Birth/Sex: Treating RN: ?26-Jun-1946 (76 y.o. F) ?Primary Care Provider: [name] Other Clinician: ?Referring Provider: ?Treating Provider/Extender: Kalman Shan ?[name] ?Weeks in Treatment: 2 ?Constitutional ?respirations regular, non-labored and within target range for patient.Marland Kitchen ?Psychiatric ?pleasant and cooperative. ?Notes ?Left lower extremity: T the posterior thigh there is a large open wound with granulation tissue And scant nonviable tissue present. There is one smaller wound ?o ?adjacent to this  with granulation tissue present. All of this is located on a significant fat/Lymphedema fold. ?Electronic Signature(s) ?Signed: 10/02/2021 3:41:00 PM By: Kalman Shan DO ?Entered By: Kalman Shan on 10/02/2021 15:38:59 ?-------------------------------------------------------------------------------- ?Physician Orders Details ?Patient Name: ?Date of Service: ?[name], [name] 10/02/2021 2:45 PM ?Medical Record Number: [id number] ?Patient Account Number: [id number] ?Date of Birth/Sex: ?Treating RN: ?1946/01/05 (76 y.o. 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 - with Dr. Heber McRoberts Leveda Anna, Room 7) ?Return A ?Bathing/ Shower/ Hygiene ?May shower and wash wound with soap and water. ?Off-Loading ?Other: - ensure no pressure or friction/shear to area to aid in wound healing. ?Wound Treatment ?Wound #1 - Lower Leg Wound Laterality: Left, Posterior ?Cleanser: Soap and Water Every Other Day/30  Days ?Discharge Instructions: May shower and wash wound with dial antibacterial soap and water prior to dressing change. ?Peri-Wound Care: Skin Prep (Generic) Every Other Day/30 Days ?Discharge Instructions: Use skin prep as directed ?Prim Dressing: Hydrofera Blue Ready Foam, 4x5 in (Generic) Every Other Day/30 Days ?ary ?Discharge Instructions: Apply to wound bed as instructed ?Prim Dressing: Santyl Ointment Every Other Day/30 Days ?ary ?Discharge Instructions: Apply ****IN CLINIC ONLY.**** ?Secondary Dressing: Bordered Gauze, 4x4 in (Generic) Every Other Day/30 Days ?Discharge Instructions: Apply over primary dressing as directed. ?Wound #2 - Upper Leg Wound Laterality: Left, Medial, Posterior ?Cleanser: Soap and Water Every Other Day/30 Days ?Discharge Instructions: May shower and wash wound with dial antibacterial soap and water prior to dressing change. ?Peri-Wound Care: Skin Prep (Generic) Every Other Day/30 Days ?Discharge Instructions: Use skin prep as directed ?Prim Dressing: Hydrofera Blue Ready Foam, 4x5 in (Generic) Every Other Day/30 Days ?ary ?Discharge Instructions: Apply to wound bed as instructed ?Secondary Dressing: Bordered Gauze, 2x3.75 in (Generic) Every Other Day/30 Days ?Discharge Instructions: Apply over primary dressing as directed. ?Electronic Signature(s) ?Signed: 10/02/2021 3:41:00 PM By: Kalman Shan DO ?Entered By: Kalman Shan on 10/02/2021 15:39:18 ?-------------------------------------------------------------------------------- ?Problem List Details ?Patient Name: Date of Service: ?[name], [name] 10/02/2021 2:45 PM ?Medical Record Number: [id number] ?Patient Account Number: [id number] ?Date of Birth/Sex: Treating RN: ?Feb 25, 1946 (76 y.o. [name] ?Primary Care Provider: [name] Other Clinician: ?Referring Provider: ?Treating Provider/Extender: Kalman Shan ?[name] ?Weeks in Treatment: 2 ?Active Problems ?ICD-10 ?Encounter ?Code Description Active  Date MDM ?Diagnosis ?[id number] Non-pressure chronic ulcer of other part of left lower leg with fat layer exposed2/28/2023 No Yes ?I89.0 Lymphedema, not elsewhere classified 09/12/2021 No Yes ?I63.9 Cerebral infarction, unspe

## 2021-10-02 NOTE — Progress Notes (Signed)
SKILER, TYE ([id number]) ?Visit Report for 10/02/2021 ?Arrival Information Details ?Patient Name: Date of Service: ?[name], [name] 10/02/2021 2:45 PM ?Medical Record Number: [id number] ?Patient Account Number: [id number] ?Date of Birth/Sex: Treating RN: ?1946-02-21 (76 y.o. Laurie Hunter ?Primary Care Jonh Mcqueary: Georgann Housekeeper Other Clinician: ?Referring Devlon Dosher: ?Treating Shandrell Boda/Extender: Geralyn Corwin ?Georgann Housekeeper ?Weeks in Treatment: 2 ?Visit Information History Since Last Visit ?Added or deleted any medications: No ?Patient Arrived: Wheel Chair ?Any new allergies or adverse reactions: No ?Arrival Time: 14:49 ?Had a fall or experienced change in No ?Accompanied By: Friend ?activities of daily living that may affect ?Transfer Assistance: Manual ?risk of falls: ?Patient Identification Verified: Yes ?Signs or symptoms of abuse/neglect since last visito No ?Secondary Verification Process Completed: Yes ?Hospitalized since last visit: No ?Patient Requires Transmission-Based Precautions: No ?Implantable device outside of the clinic excluding No ?Patient Has Alerts: Yes ?cellular tissue based products placed in the center ?Patient Alerts: Patient on Blood Thinner since last visit: ?Has Dressing in Place as Prescribed: Yes ?Pain Present Now: No ?Electronic Signature(s) ?Signed: 10/02/2021 4:26:43 PM By: Antonieta Iba ?Entered By: Antonieta Iba on 10/02/2021 14:52:47 ?-------------------------------------------------------------------------------- ?Encounter Discharge Information Details ?Patient Name: Date of Service: ?[name], [name] 10/02/2021 2:45 PM ?Medical Record Number: [id number] ?Patient Account Number: [id number] ?Date of Birth/Sex: Treating RN: ?[birth year] (76 y.o. Laurie Hunter ?Primary Care Uzma Hellmer: Georgann Housekeeper Other Clinician: ?Referring Sanyah Molnar: ?Treating Davari Lopes/Extender: Geralyn Corwin ?Georgann Housekeeper ?Weeks in Treatment: 2 ?Encounter Discharge Information Items Post  Procedure Vitals ?Discharge Condition: Stable ?Temperature (F): 98.7 ?Ambulatory Status: Wheelchair ?Pulse (bpm): 106 ?Discharge Destination: Home ?Respiratory Rate (breaths/min): 22 ?Transportation: Private Auto ?Blood Pressure (mmHg): 151/91 ?Schedule Follow-up Appointment: Yes ?Clinical Summary of Care: Provided on 10/02/2021 ?Form Type Recipient ?Paper Patient Patient ?Electronic Signature(s) ?Signed: 10/02/2021 4:26:43 PM By: Antonieta Iba ?Entered By: Antonieta Iba on 10/02/2021 15:18:50 ?-------------------------------------------------------------------------------- ?Lower Extremity Assessment Details ?Patient Name: ?Date of Service: ?[name], [name] 10/02/2021 2:45 PM ?Medical Record Number: [id number] ?Patient Account Number: [id number] ?Date of Birth/Sex: ?Treating RN: ?1945-08-13 (76 y.o. Laurie Hunter ?Primary Care Shakeisha Horine: Georgann Housekeeper ?Other Clinician: ?Referring Gordana Kewley: ?Treating Tehani Mersman/Extender: Geralyn Corwin ?Georgann Housekeeper ?Weeks in Treatment: 2 ?Electronic Signature(s) ?Signed: 10/02/2021 4:26:43 PM By: Antonieta Iba ?Entered By: Antonieta Iba on 10/02/2021 14:56:10 ?-------------------------------------------------------------------------------- ?Multi Wound Chart Details ?Patient Name: ?Date of Service: ?[name], [name] 10/02/2021 2:45 PM ?Medical Record Number: [id number] ?Patient Account Number: [id number] ?Date of Birth/Sex: ?Treating RN: ?April 07, 1946 (76 y.o. F) ?Primary Care Sheretta Grumbine: Georgann Housekeeper ?Other Clinician: ?Referring Patsy Zaragoza: ?Treating Wilmary Levit/Extender: Geralyn Corwin ?Georgann Housekeeper ?Weeks in Treatment: 2 ?Vital Signs ?Height(in): 58 ?Capillary Blood Glucose(mg/dl): 789 ?Weight(lbs): ?Pulse(bpm): 106 ?Body Mass Index(BMI): ?Blood Pressure(mmHg): 151/91 ?Temperature(??F): 98.7 ?Respiratory Rate(breaths/min): 22 ?Photos: [N/A:N/A] ?Left, Posterior Lower Leg Left, Medial, Posterior Upper Leg N/A ?Wound Location: ?Shear/Friction Gradually Appeared N/A ?Wounding  Event: ?Venous Leg Ulcer Lymphedema N/A ?Primary Etiology: ?Cataracts, Anemia, Lymphedema, Cataracts, Anemia, Lymphedema, N/A ?Comorbid History: ?Hypertension, Type II Diabetes, Hypertension, Type II Diabetes, ?Osteoarthritis Osteoarthritis ?08/16/2020 08/12/2021 N/A ?Date Acquired: ?2 2 N/A ?Weeks of Treatment: ?Open Open N/A ?Wound Status: ?No No N/A ?Wound Recurrence: ?No Yes N/A ?Clustered Wound: ?N/A 2 N/A ?Clustered Quantity: ?1.5x0.7x0.1 0.3x0.3x0.1 N/A ?Measurements L x W x D (cm) ?0.825 0.071 N/A ?A (cm?) : ?rea ?0.082 0.007 N/A ?Volume (cm?) : ?90.70% 95.50% N/A ?% Reduction in Area: ?90.70% 95.50% N/A ?% Reduction in Volume: ?Partial Thickness Full Thickness Without Exposed N/A ?Classification: ?Support Structures ?Medium Medium N/A ?Exudate  Amount: ?Serosanguineous Serosanguineous N/A ?Exudate Type: ?red, brown red, brown N/A ?Exudate Color: ?Distinct, outline attached Distinct, outline attached N/A ?Wound Margin: ?Large (67-100%) Large (67-100%) N/A ?Granulation Amount: ?Red Pink N/A ?Granulation Quality: ?None Present (0%) Small (1-33%) N/A ?Necrotic Amount: ?Fat Layer (Subcutaneous Tissue): Yes Fat Layer (Subcutaneous Tissue): Yes N/A ?Exposed Structures: ?Fascia: No ?Fascia: No ?Tendon: No ?Tendon: No ?Muscle: No ?Muscle: No ?Joint: No ?Joint: No ?Bone: No ?Bone: No ?Small (1-33%) Large (67-100%) N/A ?Epithelialization: ?Chemical/Enzymatic/Mechanical N/A N/A ?Debridement: ?Pre-procedure Verification/Time Out 15:00 N/A N/A ?Taken: ?N/A N/A N/A ?Instrument: ?None N/A N/A ?Bleeding: ?Procedure was tolerated well N/A N/A ?Debridement Treatment Response: ?1.5x0.7x0.1 N/A N/A ?Post Debridement Measurements L x ?W x D (cm) ?0.082 N/A N/A ?Post Debridement Volume: (cm?) ?Debridement N/A N/A ?Procedures Performed: ?Treatment Notes ?Wound #1 (Lower Leg) Wound Laterality: Left, Posterior ?Cleanser ?Soap and Water ?Discharge Instruction: May shower and wash wound with dial antibacterial soap and water prior to  dressing change. ?Peri-Wound Care ?Skin Prep ?Discharge Instruction: Use skin prep as directed ?Topical ?Primary Dressing ?Hydrofera Blue Ready Foam, 4x5 in ?Discharge Instruction: Apply to wound bed as instructed ?Santyl Ointment ?Discharge Instruction: Apply ****IN CLINIC ONLY.**** ?Secondary Dressing ?Bordered Gauze, 4x4 in ?Discharge Instruction: Apply over primary dressing as directed. ?Secured With ?Compression Wrap ?Compression Stockings ?Add-Ons ?Wound #2 (Upper Leg) Wound Laterality: Left, Medial, Posterior ?Cleanser ?Soap and Water ?Discharge Instruction: May shower and wash wound with dial antibacterial soap and water prior to dressing change. ?Peri-Wound Care ?Skin Prep ?Discharge Instruction: Use skin prep as directed ?Topical ?Primary Dressing ?Hydrofera Blue Ready Foam, 4x5 in ?Discharge Instruction: Apply to wound bed as instructed ?Secondary Dressing ?Bordered Gauze, 2x3.75 in ?Discharge Instruction: Apply over primary dressing as directed. ?Secured With ?Compression Wrap ?Compression Stockings ?Add-Ons ?Electronic Signature(s) ?Signed: 10/02/2021 3:41:00 PM By: Geralyn Corwin DO ?Entered By: Geralyn Corwin on 10/02/2021 15:24:45 ?-------------------------------------------------------------------------------- ?Multi-Disciplinary Care Plan Details ?Patient Name: ?Date of Service: ?[name], [name] 10/02/2021 2:45 PM ?Medical Record Number: [id number] ?Patient Account Number: [id number] ?Date of Birth/Sex: ?Treating RN: ?1945/07/20 (76 y.o. Laurie Hunter ?Primary Care Jensyn Cambria: Georgann Housekeeper ?Other Clinician: ?Referring Desmin Daleo: ?Treating Colen Eltzroth/Extender: Geralyn Corwin ?Georgann Housekeeper ?Weeks in Treatment: 2 ?Active Inactive ?Wound/Skin Impairment ?Nursing Diagnoses: ?Knowledge deficit related to ulceration/compromised skin integrity ?Goals: ?Patient/caregiver will verbalize understanding of skin care regimen ?Date Initiated: 09/12/2021 ?Target Resolution Date: 11/06/2021 ?Goal Status:  Active ?Interventions: ?Assess patient/caregiver ability to obtain necessary supplies ?Assess patient/caregiver ability to perform ulcer/skin care regimen upon admission and as needed ?Provide education on ulcer and sk

## 2021-10-09 ENCOUNTER — Ambulatory Visit
Admission: RE | Admit: 2021-10-09 | Discharge: 2021-10-09 | Disposition: A | Discharge status: Home / Self Care | Payer: Medicare Other | Source: Ambulatory Visit | Attending: Internal Medicine | Admitting: Internal Medicine

## 2021-10-09 DIAGNOSIS — Z1231 Encounter for screening mammogram for malignant neoplasm of breast: Secondary | ICD-10-CM | POA: Diagnosis not present

## 2021-10-09 DIAGNOSIS — M85832 Other specified disorders of bone density and structure, left forearm: Secondary | ICD-10-CM | POA: Diagnosis not present

## 2021-10-09 DIAGNOSIS — M810 Age-related osteoporosis without current pathological fracture: Secondary | ICD-10-CM

## 2021-10-16 ENCOUNTER — Encounter (HOSPITAL_BASED_OUTPATIENT_CLINIC_OR_DEPARTMENT_OTHER): Payer: Medicare Other | Attending: Internal Medicine | Admitting: Internal Medicine

## 2021-10-16 DIAGNOSIS — E1136 Type 2 diabetes mellitus with diabetic cataract: Secondary | ICD-10-CM | POA: Diagnosis not present

## 2021-10-16 DIAGNOSIS — I69354 Hemiplegia and hemiparesis following cerebral infarction affecting left non-dominant side: Secondary | ICD-10-CM | POA: Insufficient documentation

## 2021-10-16 DIAGNOSIS — I890 Lymphedema, not elsewhere classified: Secondary | ICD-10-CM | POA: Diagnosis not present

## 2021-10-16 DIAGNOSIS — I639 Cerebral infarction, unspecified: Secondary | ICD-10-CM | POA: Diagnosis not present

## 2021-10-16 DIAGNOSIS — E11622 Type 2 diabetes mellitus with other skin ulcer: Secondary | ICD-10-CM | POA: Insufficient documentation

## 2021-10-16 DIAGNOSIS — L97822 Non-pressure chronic ulcer of other part of left lower leg with fat layer exposed: Secondary | ICD-10-CM | POA: Insufficient documentation

## 2021-10-16 NOTE — Progress Notes (Signed)
Laurie Hunter ([id number]) ?Visit Report for 10/16/2021 ?Chief Complaint Document Details ?Patient Name: Date of Service: ?Laurie Hunter, Laurie RIA D. 10/16/2021 2:45 PM ?Medical Record Number: [id number] ?Patient Account Number: [id number] ?Date of Birth/Sex: Treating RN: ?1945/08/13 (76 y.o. [name] ?Primary Care Provider: Georgann Housekeeper Other Clinician: ?Referring Provider: ?Treating Provider/Extender: Geralyn Corwin ?Georgann Housekeeper ?Weeks in Treatment: 4 ?Information Obtained from: Patient ?Chief Complaint ?Left lower extremity wound ?Electronic Signature(s) ?Signed: 10/16/2021 3:38:21 PM By: Geralyn Corwin DO ?Entered By: Geralyn Corwin on 10/16/2021 15:33:49 ?-------------------------------------------------------------------------------- ?Debridement Details ?Patient Name: Date of Service: ?Laurie Hunter, Laurie RIA D. 10/16/2021 2:45 PM ?Medical Record Number: [id number] ?Patient Account Number: [id number] ?Date of Birth/Sex: Treating RN: ?1946/04/13 (76 y.o. [name] ?Primary Care Provider: Georgann Housekeeper Other Clinician: ?Referring Provider: ?Treating Provider/Extender: Geralyn Corwin ?Georgann Housekeeper ?Weeks in Treatment: 4 ?Debridement Performed for Assessment: Wound #1 Left,Posterior Lower Leg ?Performed By: Physician Geralyn Corwin, DO ?Debridement Type: Chemical/Enzymatic/Mechanical ?Agent Used: Santyl ?Severity of Tissue Pre Debridement: Fat layer exposed ?Level of Consciousness (Pre-procedure): Awake and Alert ?Pre-procedure Verification/Time Out Yes - 15:22 ?Taken: ?Start Time: 15:23 ?Bleeding: Minimum ?End Time: 15:28 ?Response to Treatment: Procedure was tolerated well ?Level of Consciousness (Post- Awake and Alert ?procedure): ?Post Debridement Measurements of Total Wound ?Length: (cm) 1 ?Width: (cm) 0.7 ?Depth: (cm) 0.1 ?Volume: (cm?) 0.055 ?Character of Wound/Ulcer Post Debridement: Stable ?Severity of Tissue Post Debridement: Fat layer exposed ?Post Procedure Diagnosis ?Same as  Pre-procedure ?Electronic Signature(s) ?Signed: 10/16/2021 3:38:21 PM By: Geralyn Corwin DO ?Signed: 10/16/2021 4:45:02 PM By: Antonieta Iba ?Entered By: Antonieta Iba on 10/16/2021 15:30:47 ?-------------------------------------------------------------------------------- ?HPI Details ?Patient Name: Date of Service: ?Laurie Hunter, Laurie RIA D. 10/16/2021 2:45 PM ?Medical Record Number: [id number] ?Patient Account Number: [id number] ?Date of Birth/Sex: Treating RN: ?03-23-1946 (76 y.o. [name] ?Primary Care Provider: Georgann Housekeeper Other Clinician: ?Referring Provider: ?Treating Provider/Extender: Geralyn Corwin ?Georgann Housekeeper ?Weeks in Treatment: 4 ?History of Present Illness ?HPI Description: Admission 09/12/2021 ?Ms. [name] is a 76 year old female with a past medical history of type 2 diabetes on oral agents, CVA with left-sided weakness that presents to the ?clinic for a 1 to 106-month history of wound to her left posterior leg. She states that this started from friction from the mattress she lays on in the chair she sits ?in. She is not accommodated to help relieve the pressure. She has been followed by her primary care physician for this issue and been treated with 2 rounds ?of doxycycline. She reports improvement in drainage after taking antibiotics. She currently denies signs of infection. She currently keeps the area covered. ?3/8; patient presents for follow-up. She has been using Hydrofera Blue with no issues. She denies signs of infection. ?3/20; patient presents for follow-up. She continues to use [facility] with no issues. She has no questions or concerns today. ?4/3; patient presents for follow-up. She has been using Hydrofera Blue with no issues. She denies signs of infection. ?Electronic Signature(s) ?Signed: 10/16/2021 3:38:21 PM By: Geralyn Corwin DO ?Entered By: Geralyn Corwin on 10/16/2021  15:34:11 ?-------------------------------------------------------------------------------- ?Physical Exam Details ?Patient Name: Date of Service: ?Laurie Hunter, Laurie RIA D. 10/16/2021 2:45 PM ?Medical Record Number: [id number] ?Patient Account Number: [id number] ?Date of Birth/Sex: Treating RN: ?July 19, 1945 (76 y.o. [name] ?Primary Care Provider: Georgann Housekeeper Other Clinician: ?Referring Provider: ?Treating Provider/Extender: Geralyn Corwin ?Georgann Housekeeper ?Weeks in Treatment: 4 ?Constitutional ?respirations regular, non-labored and within target range for patient.Marland Kitchen ?Psychiatric ?pleasant and cooperative. ?Notes ?Left lower extremity: T the  posterior thigh located to a significant fat/lymphedema fold there are 2 open wounds with granulation tissue. No surrounding signs ?o ?of infection. ?Electronic Signature(s) ?Signed: 10/16/2021 3:38:21 PM By: Geralyn Corwin DO ?Entered By: Geralyn Corwin on 10/16/2021 15:35:01 ?-------------------------------------------------------------------------------- ?Physician Orders Details ?Patient Name: ?Date of Service: ?Laurie Hunter, Laurie RIA D. 10/16/2021 2:45 PM ?Medical Record Number: [id number] ?Patient Account Number: [id number] ?Date of Birth/Sex: ?Treating RN: ?[birth year] (76 y.o. [name] ?Primary Care Provider: Georgann Housekeeper ?Other Clinician: ?Referring Provider: ?Treating Provider/Extender: Geralyn Corwin ?Georgann Housekeeper ?Weeks in Treatment: 4 ?Verbal / Phone Orders: No ?Diagnosis Coding ?Follow-up Appointments ?Return appointment in 3 weeks. - with Dr. Mikey Bussing Lennox Laity, Room 7) ?Bathing/ Shower/ Hygiene ?May shower and wash wound with soap and water. ?Off-Loading ?Other: - ensure no pressure or friction/shear to area to aid in wound healing. ?Wound Treatment ?Wound #1 - Lower Leg Wound Laterality: Left, Posterior ?Cleanser: Soap and Water Every Other Day/30 Days ?Discharge Instructions: May shower and wash wound with dial antibacterial soap and water prior to dressing  change. ?Peri-Wound Care: Skin Prep (Generic) Every Other Day/30 Days ?Discharge Instructions: Use skin prep as directed ?Prim Dressing: Hydrofera Blue Ready Foam, 4x5 in (Generic) Every Other Day/30 Days ?ary ?Discharge Instructions: Apply to wound bed as instructed ?Prim Dressing: Santyl Ointment Every Other Day/30 Days ?ary ?Discharge Instructions: Apply ****IN CLINIC ONLY.**** ?Secondary Dressing: Bordered Gauze, 4x4 in (DME) (Generic) Every Other Day/30 Days ?Discharge Instructions: Apply over primary dressing as directed. ?Wound #2 - Upper Leg Wound Laterality: Left, Medial, Posterior ?Cleanser: Soap and Water Every Other Day/30 Days ?Discharge Instructions: May shower and wash wound with dial antibacterial soap and water prior to dressing change. ?Peri-Wound Care: Skin Prep (Generic) Every Other Day/30 Days ?Discharge Instructions: Use skin prep as directed ?Topical: Antibiotic Ointment Every Other Day/30 Days ?Secondary Dressing: Bordered Gauze, 2x3.75 in (DME) (Generic) Every Other Day/30 Days ?Discharge Instructions: Apply over primary dressing as directed. ?Electronic Signature(s) ?Signed: 10/16/2021 3:38:21 PM By: Geralyn Corwin DO ?Entered By: Geralyn Corwin on 10/16/2021 15:35:18 ?-------------------------------------------------------------------------------- ?Problem List Details ?Patient Name: ?Date of Service: ?Laurie Hunter, Laurie RIA D. 10/16/2021 2:45 PM ?Medical Record Number: [id number] ?Patient Account Number: [id number] ?Date of Birth/Sex: ?Treating RN: ?Dec 06, 1945 (76 y.o. 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

## 2021-10-16 NOTE — Progress Notes (Signed)
Laurie Hunter, Laurie Hunter ([id number]) ?Visit Report for 10/16/2021 ?Arrival Information Details ?Patient Name: Date of Service: ?Laurie Hunter, Laurie RIA D. 10/16/2021 2:45 PM ?Medical Record Number: [id number] ?Patient Account Number: [id number] ?Date of Birth/Sex: Treating RN: ?03/16/1946 (76 y.o. Laurie Hunter ?Primary Care Franciso Dierks: [name] Other Clinician: ?Referring Lashana Spang: ?Treating Marte Celani/Extender: Kalman Shan ?[name] ?Weeks in Treatment: 4 ?Visit Information History Since Last Visit ?Added or deleted any medications: No ?Patient Arrived: Wheel Chair ?Any new allergies or adverse reactions: No ?Arrival Time: 15:09 ?Had a fall or experienced change in No ?Accompanied By: friend ?activities of daily living that may affect ?Transfer Assistance: Manual ?risk of falls: ?Patient Identification Verified: Yes ?Signs or symptoms of abuse/neglect since last visito No ?Secondary Verification Process Completed: Yes ?Hospitalized since last visit: No ?Patient Requires Transmission-Based Precautions: No ?Implantable device outside of the clinic excluding No ?Patient Has Alerts: Yes ?cellular tissue based products placed in the center ?Patient Alerts: Patient on Blood Thinner since last visit: ?Has Dressing in Place as Prescribed: Yes ?Pain Present Now: No ?Electronic Signature(s) ?Signed: 10/16/2021 4:45:02 PM By: Lorrin Jackson ?Entered By: Lorrin Jackson on 10/16/2021 15:13:43 ?-------------------------------------------------------------------------------- ?Encounter Discharge Information Details ?Patient Name: Date of Service: ?Laurie Hunter, Laurie RIA D. 10/16/2021 2:45 PM ?Medical Record Number: [id number] ?Patient Account Number: [id number] ?Date of Birth/Sex: Treating RN: ?06/03/1946 (76 y.o. Laurie Hunter ?Primary Care Shukri Nistler: [name] Other Clinician: ?Referring Adelynn Gipe: ?Treating Burna Atlas/Extender: Kalman Shan ?[name] ?Weeks in Treatment: 4 ?Encounter Discharge Information Items Post Procedure  Vitals ?Discharge Condition: Stable ?Temperature (F): 98.6 ?Ambulatory Status: Wheelchair ?Pulse (bpm): 116 ?Discharge Destination: Home ?Respiratory Rate (breaths/min): 22 ?Transportation: Private Auto ?Blood Pressure (mmHg): 157/81 ?Accompanied By: friend ?Schedule Follow-up Appointment: Yes ?Clinical Summary of Care: Provided on 10/16/2021 ?Form Type Recipient ?Paper Patient Patient ?Electronic Signature(s) ?Signed: 10/16/2021 4:45:02 PM By: Lorrin Jackson ?Entered By: Lorrin Jackson on 10/16/2021 15:43:26 ?-------------------------------------------------------------------------------- ?Lower Extremity Assessment Details ?Patient Name: ?Date of Service: ?Laurie Hunter, Laurie RIA D. 10/16/2021 2:45 PM ?Medical Record Number: [id number] ?Patient Account Number: [id number] ?Date of Birth/Sex: ?Treating RN: ?[birth year] (76 y.o. Laurie Hunter ?Primary Care Brycin Kille: [name] ?Other Clinician: ?Referring Dion Parrow: ?Treating Nazia Rhines/Extender: Kalman Shan ?[name] ?Weeks in Treatment: 4 ?Electronic Signature(s) ?Signed: 10/16/2021 4:45:02 PM By: Lorrin Jackson ?Entered By: Lorrin Jackson on 10/16/2021 15:14:03 ?-------------------------------------------------------------------------------- ?Multi Wound Chart Details ?Patient Name: ?Date of Service: ?Laurie Hunter, Laurie RIA D. 10/16/2021 2:45 PM ?Medical Record Number: [id number] ?Patient Account Number: [id number] ?Date of Birth/Sex: ?Treating RN: ?09/20/1945 (76 y.o. Laurie Hunter ?Primary Care Marl Seago: [name] ?Other Clinician: ?Referring Zachary Nole: ?Treating Korine Winton/Extender: Kalman Shan ?[name] ?Weeks in Treatment: 4 ?Vital Signs ?Height(in): 58 ?Capillary Blood Glucose(mg/dl): 115 ?Weight(lbs): ?Pulse(bpm): 116 ?Body Mass Index(BMI): ?Blood Pressure(mmHg): 157/81 ?Temperature(??F): 98.6 ?Respiratory Rate(breaths/min): 22 ?Photos: [N/A:N/A] ?Left, Posterior Lower Leg Left, Medial, Posterior Upper Leg N/A ?Wound Location: ?Shear/Friction Gradually  Appeared N/A ?Wounding Event: ?Venous Leg Ulcer Lymphedema N/A ?Primary Etiology: ?Cataracts, Anemia, Lymphedema, Cataracts, Anemia, Lymphedema, N/A ?Comorbid History: ?Hypertension, Type II Diabetes, Hypertension, Type II Diabetes, ?Osteoarthritis Osteoarthritis ?08/16/2020 08/12/2021 N/A ?Date Acquired: ?4 4 N/A ?Weeks of Treatment: ?Open Open N/A ?Wound Status: ?No No N/A ?Wound Recurrence: ?No Yes N/A ?Clustered Wound: ?N/A 2 N/A ?Clustered Quantity: ?1x0.7x0.1 0.3x0.2x0.1 N/A ?Measurements L x W x D (cm) ?0.55 0.047 N/A ?A (cm?) : ?rea ?0.055 0.005 N/A ?Volume (cm?) : ?93.80% 97.00% N/A ?% Reduction in Area: ?93.80% 96.80% N/A ?% Reduction in Volume: ?Partial Thickness Full Thickness Without Exposed N/A ?Classification: ?Support  Structures ?Medium Medium N/A ?Exudate Amount: ?Serosanguineous Serosanguineous N/A ?Exudate Type: ?red, brown red, brown N/A ?Exudate Color: ?Distinct, outline attached Distinct, outline attached N/A ?Wound Margin: ?Large (67-100%) Large (67-100%) N/A ?Granulation Amount: ?Red Pink N/A ?Granulation Quality: ?None Present (0%) Small (1-33%) N/A ?Necrotic Amount: ?Fat Layer (Subcutaneous Tissue): Yes Fat Layer (Subcutaneous Tissue): Yes N/A ?Exposed Structures: ?Fascia: No ?Fascia: No ?Tendon: No ?Tendon: No ?Muscle: No ?Muscle: No ?Joint: No ?Joint: No ?Bone: No ?Bone: No ?Large (67-100%) Large (67-100%) N/A ?Epithelialization: ?Chemical/Enzymatic/Mechanical N/A N/A ?Debridement: ?Pre-procedure Verification/Time Out 15:22 N/A N/A ?Taken: ?N/A N/A N/A ?Instrument: ?Minimum N/A N/A ?Bleeding: ?Procedure was tolerated well N/A N/A ?Debridement Treatment Response: ?1x0.7x0.1 N/A N/A ?Post Debridement Measurements L x ?W x D (cm) ?0.055 N/A N/A ?Post Debridement Volume: (cm?) ?Debridement N/A N/A ?Procedures Performed: ?Treatment Notes ?Electronic Signature(s) ?Signed: 10/16/2021 3:38:21 PM By: Kalman Shan DO ?Signed: 10/16/2021 4:45:02 PM By: Lorrin Jackson ?Entered By: Kalman Shan on  10/16/2021 15:33:13 ?-------------------------------------------------------------------------------- ?Multi-Disciplinary Care Plan Details ?Patient Name: ?Date of Service: ?Laurie Hunter, Laurie RIA D. 10/16/2021 2:45 PM ?Medical Record Number: [id number] ?Patient Account Number: [id number] ?Date of Birth/Sex: ?Treating RN: ?05-02-1946 (76 y.o. Laurie Hunter ?Primary Care Samual Beals: [name] ?Other Clinician: ?Referring Shemia Bevel: ?Treating Weston Fulco/Extender: Kalman Shan ?[name] ?Weeks in Treatment: 4 ?Active Inactive ?Wound/Skin Impairment ?Nursing Diagnoses: ?Knowledge deficit related to ulceration/compromised skin integrity ?Goals: ?Patient/caregiver will verbalize understanding of skin care regimen ?Date Initiated: 09/12/2021 ?Target Resolution Date: 11/06/2021 ?Goal Status: Active ?Interventions: ?Assess patient/caregiver ability to obtain necessary supplies ?Assess patient/caregiver ability to perform ulcer/skin care regimen upon admission and as needed ?Provide education on ulcer and skin care ?Treatment Activities: ?Skin care regimen initiated : 09/12/2021 ?Topical wound management initiated : 09/12/2021 ?Notes: ?10/02/21: Wound care regimen continues ?Electronic Signature(s) ?Signed: 10/16/2021 4:45:02 PM By: Lorrin Jackson ?Entered By: Lorrin Jackson on 10/16/2021 15:25:15 ?-------------------------------------------------------------------------------- ?Pain Assessment Details ?Patient Name: ?Date of Service: ?Laurie Hunter, Laurie RIA D. 10/16/2021 2:45 PM ?Medical Record Number: [id number] ?Patient Account Number: [id number] ?Date of Birth/Sex: ?Treating RN: ?[birth year] (76 y.o. Laurie Hunter ?Primary Care Laureen Frederic: [name] ?Other Clinician: ?Referring Antoney Biven: ?Treating Archer Moist/Extender: Kalman Shan ?[name] ?Weeks in Treatment: 4 ?Active Problems ?Location of Pain Severity and Description of Pain ?Patient Has Paino No ?Site Locations ?Pain Management and Medication ?Current Pain  Management: ?Electronic Signature(s) ?Signed: 10/16/2021 4:45:02 PM By: Lorrin Jackson ?Entered By: Lorrin Jackson on 10/16/2021 15:13:55 ?------------------------------------------------------------------------------

## 2021-10-17 DIAGNOSIS — I890 Lymphedema, not elsewhere classified: Secondary | ICD-10-CM | POA: Diagnosis not present

## 2021-10-17 DIAGNOSIS — L97822 Non-pressure chronic ulcer of other part of left lower leg with fat layer exposed: Secondary | ICD-10-CM | POA: Diagnosis not present

## 2021-11-06 ENCOUNTER — Encounter (HOSPITAL_BASED_OUTPATIENT_CLINIC_OR_DEPARTMENT_OTHER): Payer: Medicare Other | Admitting: Internal Medicine

## 2021-11-06 DIAGNOSIS — I639 Cerebral infarction, unspecified: Secondary | ICD-10-CM

## 2021-11-06 DIAGNOSIS — E1136 Type 2 diabetes mellitus with diabetic cataract: Secondary | ICD-10-CM | POA: Diagnosis not present

## 2021-11-06 DIAGNOSIS — E11622 Type 2 diabetes mellitus with other skin ulcer: Secondary | ICD-10-CM

## 2021-11-06 DIAGNOSIS — L97822 Non-pressure chronic ulcer of other part of left lower leg with fat layer exposed: Secondary | ICD-10-CM | POA: Diagnosis not present

## 2021-11-06 DIAGNOSIS — I890 Lymphedema, not elsewhere classified: Secondary | ICD-10-CM | POA: Diagnosis not present

## 2021-11-06 DIAGNOSIS — I69354 Hemiplegia and hemiparesis following cerebral infarction affecting left non-dominant side: Secondary | ICD-10-CM | POA: Diagnosis not present

## 2021-11-06 NOTE — Progress Notes (Signed)
Laurie Hunter ([id number]) ?Visit Report for 11/06/2021 ?Chief Complaint Document Details ?Patient Name: Date of Service: ?[name], [name] 11/06/2021 2:45 PM ?Medical Record Number: [id number] ?Patient Account Number: [id number] ?Date of Birth/Sex: Treating RN: ?Feb 16, 1946 (76 y.o. Laurie Hunter ?Primary Care Provider: [name] Other Clinician: ?Referring Provider: ?Treating Provider/Extender: Kalman Shan ?[name] ?Weeks in Treatment: 7 ?Information Obtained from: Patient ?Chief Complaint ?Left lower extremity wound ?Electronic Signature(s) ?Signed: 11/06/2021 3:53:12 PM By: Kalman Shan DO ?Entered By: Kalman Shan on 11/06/2021 15:19:14 ?-------------------------------------------------------------------------------- ?HPI Details ?Patient Name: Date of Service: ?[name], [name] 11/06/2021 2:45 PM ?Medical Record Number: [id number] ?Patient Account Number: [id number] ?Date of Birth/Sex: Treating RN: ?10-01-1945 (76 y.o. Laurie Hunter ?Primary Care Provider: [name] Other Clinician: ?Referring Provider: ?Treating Provider/Extender: Kalman Shan ?[name] ?Weeks in Treatment: 7 ?History of Present Illness ?HPI Description: Admission 09/12/2021 ?Ms. Laurie Hunter is a 76 year old female with a past medical history of type 2 diabetes on oral agents, CVA with left-sided weakness that presents to the ?clinic for a 1 to 48-month history of wound to her left posterior leg. She states that this started from friction from the mattress she lays on in the chair she sits ?in. She is not accommodated to help relieve the pressure. She has been followed by her primary care physician for this issue and been treated with 2 rounds ?of doxycycline. She reports improvement in drainage after taking antibiotics. She currently denies signs of infection. She currently keeps the area covered. ?3/8; patient presents for follow-up. She has been using Hydrofera Blue with no issues. She denies  signs of infection. ?3/20; patient presents for follow-up. She continues to use [facility] with no issues. She has no questions or concerns today. ?4/3; patient presents for follow-up. She has been using Hydrofera Blue with no issues. She denies signs of infection. ?4/24; patient presents for follow-up. She been using Hydrofera Blue and antibiotic ointment to the wound beds up until a few days ago when she thought that ?the wounds had closed. She currently denies any drainage from the previous wound sites. She denies signs of infection. ?Electronic Signature(s) ?Signed: 11/06/2021 3:53:12 PM By: Kalman Shan DO ?Entered By: Kalman Shan on 11/06/2021 15:20:15 ?-------------------------------------------------------------------------------- ?Physical Exam Details ?Patient Name: Date of Service: ?[name], [name] 11/06/2021 2:45 PM ?Medical Record Number: [id number] ?Patient Account Number: [id number] ?Date of Birth/Sex: Treating RN: ?10-22-1945 (76 y.o. Laurie Hunter ?Primary Care Provider: [name] Other Clinician: ?Referring Provider: ?Treating Provider/Extender: Kalman Shan ?[name] ?Weeks in Treatment: 7 ?Constitutional ?respirations regular, non-labored and within target range for patient.Marland Kitchen ?Cardiovascular ?2+ dorsalis pedis/posterior tibialis pulses. ?Psychiatric ?pleasant and cooperative. ?Notes ?Left lower extremity: T the posterior thigh located to a significant fat/lymphedema fold there is 1 open wound with granulation tissue and dried nonviable ?o ?tissue. The other adjacent wound appears healed. No signs of surrounding infection. ?Electronic Signature(s) ?Signed: 11/06/2021 3:53:12 PM By: Kalman Shan DO ?Entered By: Kalman Shan on 11/06/2021 15:21:07 ?-------------------------------------------------------------------------------- ?Physician Orders Details ?Patient Name: Date of Service: ?[name], [name] 11/06/2021 2:45 PM ?Medical Record Number:  [id number] ?Patient Account Number: [id number] ?Date of Birth/Sex: Treating RN: ?08-Dec-1945 (76 y.o. 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  I89.0 Lymphedema, not elsewhere classified ?I63.9 Cerebral infarction, unspecified ?E11.622 Type 2 diabetes mellitus with other skin ulcer ?Follow-up Appointments ?Return appointment in 3 weeks. - with Dr. He[place] [facility], Room 7) ?Bathing/ Shower/ Hygiene ?May shower and wash wound with soap and water. ?Off-Loading ?Other: - ensure no pressure or friction/shear to area to aid in wound healing. ?Non Wound Condition ?pply the following to affected area as directed: - Apply antibiotic ointment to newly healed area. ?A ?Wound Treatment ?Wound #1 - Lower Leg Wound Laterality: Left, Posterior ?Cleanser: Soap and Water Every Other Day/30 Days ?Discharge Instructions: May shower and wash wound with dial antibacterial soap and water prior to dressing change. ?Peri-Wound Care: Skin Prep (Generic) Every Other Day/30 Days ?Discharge Instructions: Use skin prep as directed ?Prim Dressing: Hydrofera Blue Ready Foam, 4x5 in (Generic) Every Other Day/30 Days ?ary ?Discharge Instructions: Apply to wound bed as instructed ?Secondary Dressing: Bordered Gauze, 4x4 in (Generic) Every Other Day/30 Days ?Discharge Instructions: Apply over primary dressing as directed. ?Electronic Signature(s) ?Signed: 11/06/2021 3:53:12 PM By: Kalman Shan DO ?Entered By: Kalman Shan on 11/06/2021 15:21:23 ?-------------------------------------------------------------------------------- ?Problem List Details ?Patient Name: ?Date of Service: ?[name], [name] 11/06/2021 2:45 PM ?Medical Record Number:  [id number] ?Patient Account Number: [id number] ?Date of Birth/Sex: ?Treating RN: ?[birth year] (76 y.o. Laurie Hunter ?Primary Care Provider: [name] ?Other Clinician: ?Referring Provider: ?Treating Provider/Extender: Kalman Shan ?[name] ?Weeks in Treatment: 7 ?Active Problems ?ICD-10 ?Encounter ?Code Description Active Date MDM ?Diagnosis ?[id number] Non-pressure chronic ulcer of other part of left lower leg with fat layer exposed2/28/2023 No Yes ?I89.0 Lymphedema, not elsewhere classified 09/12/2021 No Yes ?I63.9 Cerebral infarction, unspecified 09/12/2021 No Yes ?E11.622 Type 2 diabetes mellitus with other skin ulcer 09/12/2021 No Yes ?Inactive Problems ?Resolved Problems ?Electronic Signature(s) ?Signed: 11/06/2021 3:53:12 PM By: Kalman Shan DO ?Entered By: Kalman Shan on 11/06/2021 15:18:22 ?-------------------------------------------------------------------------------- ?Progress Note Details ?Patient Name: Date of Service: ?[name], [name] 11/06/2021 2:45 PM ?Medical Record Number: [id number] ?Patient Account Number: [id number] ?Date of Birth/Sex: Treating RN: ?[birth year] (76 y.o. Laurie Hunter ?Primary Care Provider: [name] Other Clinician: ?Referring Provider: ?Treating Provider/Extender: Kalman Shan ?[name] ?Weeks in Treatment: 7 ?Subjective ?Chief Complaint ?Information obtained from Patient ?Left lower extremity wound ?History of Present Illness (HPI) ?Admission 09/12/2021 ?Ms. Laurie Hunter is a 76 year old female with a past medical history of type 2 diabetes on oral agents, CVA with left-sided weakness that presents to the ?clinic for a 1 to 57-month history of wound to her left posterior leg. She states that this started from friction from the mattress she lays on in the chair she sits ?in. She is not accommodated to help relieve the pressure. She has been followed by her primary care physician for this issue and been treated with 2 rounds ?of doxycycline.  She reports improvement in drainage after taking antibiotics. She currently denies signs of infection. She currently keeps the area covered. ?3/8; patient presents for follow-up. She has been using Hydrofera Blue with no iss

## 2021-11-06 NOTE — Progress Notes (Signed)
[name], EYE ([id number]) ?Visit Report for 11/06/2021 ?Arrival Information Details ?Patient Name: Date of Service: ?Laurie Hunter RIA D. 11/06/2021 2:45 PM ?Medical Record Number: [id number] ?Patient Account Number: [id number] ?Date of Birth/Sex: Treating RN: ?21-Feb-1946 (76 y.o. Laurie Hunter ?Primary Care Takenya Travaglini: Georgann Housekeeper Other Clinician: ?Referring Daphane Odekirk: ?Treating [place] Souter/Extender: Geralyn Corwin ?Georgann Housekeeper ?Weeks in Treatment: 7 ?Visit Information History Since Last Visit ?Added or deleted any medications: No ?Patient Arrived: Wheel Chair ?Any new allergies or adverse reactions: No ?Arrival Time: 14:51 ?Had a fall or experienced change in No ?Accompanied By: friend ?activities of daily living that may affect ?Transfer Assistance: Manual ?risk of falls: ?Patient Identification Verified: Yes ?Signs or symptoms of abuse/neglect since last visito No ?Secondary Verification Process Completed: Yes ?Hospitalized since last visit: No ?Patient Requires Transmission-Based Precautions: No ?Implantable device outside of the clinic excluding No ?Patient Has Alerts: Yes ?cellular tissue based products placed in the center ?Patient Alerts: Patient on Blood Thinner since last visit: ?Has Dressing in Place as Prescribed: Yes ?Pain Present Now: No ?Electronic Signature(s) ?Signed: 11/06/2021 4:57:13 PM By: Antonieta Iba ?Entered By: Antonieta Iba on 11/06/2021 14:52:02 ?-------------------------------------------------------------------------------- ?Clinic Level of Care Assessment Details ?Patient Name: Date of Service: ?Laurie Hunter RIA D. 11/06/2021 2:45 PM ?Medical Record Number: [id number] ?Patient Account Number: [id number] ?Date of Birth/Sex: Treating RN: ?1946-03-05 (76 y.o. Laurie Hunter ?Primary Care Scottie Metayer: Georgann Housekeeper Other Clinician: ?Referring Adisson Deak: ?Treating Emmanuell Kantz/Extender: Geralyn Corwin ?Georgann Housekeeper ?Weeks in Treatment: 7 ?Clinic Level of Care Assessment Items ?TOOL 4  Quantity Score ?X- 1 0 ?Use when only an EandM is performed on FOLLOW-UP visit ?ASSESSMENTS - Nursing Assessment / Reassessment ?X- 1 10 ?Reassessment of Co-morbidities (includes updates in patient status) ?X- 1 5 ?Reassessment of Adherence to Treatment Plan ?ASSESSMENTS - Wound and Skin A ssessment / Reassessment ?[]  - 0 ?Simple Wound Assessment / Reassessment - one wound ?X- 2 5 ?Complex Wound Assessment / Reassessment - multiple wounds ?[]  - 0 ?Dermatologic / Skin Assessment (not related to wound area) ?ASSESSMENTS - Focused Assessment ?[]  - 0 ?Circumferential Edema Measurements - multi extremities ?[]  - 0 ?Nutritional Assessment / Counseling / Intervention ?[]  - 0 ?Lower Extremity Assessment (monofilament, tuning fork, pulses) ?[]  - 0 ?Peripheral Arterial Disease Assessment (using hand held doppler) ?ASSESSMENTS - Ostomy and/or Continence Assessment and Care ?[]  - 0 ?Incontinence Assessment and Management ?[]  - 0 ?Ostomy Care Assessment and Management (repouching, etc.) ?PROCESS - Coordination of Care ?[]  - 0 ?Simple Patient / Family Education for ongoing care ?X- 1 20 ?Complex (extensive) Patient / Family Education for ongoing care ?X- 1 10 ?Staff obtains Consents, Records, T Results / Process Orders ?est ?[]  - 0 ?Staff telephones HHA, Nursing Homes / Clarify orders / etc ?[]  - 0 ?Routine Transfer to another Facility (non-emergent condition) ?[]  - 0 ?Routine Hospital Admission (non-emergent condition) ?[]  - 0 ?New Admissions / / Ordering NPWT Apligraf, etc. ?, ?[]  - 0 ?Emergency Hospital Admission (emergent condition) ?[]  - 0 ?Simple Discharge Coordination ?[]  - 0 ?Complex (extensive) Discharge Coordination ?PROCESS - Special Needs ?[]  - 0 ?Pediatric / Minor Patient Management ?[]  - 0 ?Isolation Patient Management ?[]  - 0 ?Hearing / Language / Visual special needs ?[]  - 0 ?Assessment of Community assistance (transportation, D/C planning, etc.) ?[]  - 0 ?Additional assistance / Altered  mentation ?[]  - 0 ?Support Surface(s) Assessment (bed, cushion, seat, etc.) ?INTERVENTIONS - Wound Cleansing / Measurement ?X - Simple Wound Cleansing - one wound 1 5 ?[]  -  0 ?Complex Wound Cleansing - multiple wounds ?X- 1 5 ?Wound Imaging (photographs - any number of wounds) ?[]  - 0 ?Wound Tracing (instead of photographs) ?X- 1 5 ?Simple Wound Measurement - one wound ?[]  - 0 ?Complex Wound Measurement - multiple wounds ?INTERVENTIONS - Wound Dressings ?X - Small Wound Dressing one or multiple wounds 1 10 ?[]  - 0 ?Medium Wound Dressing one or multiple wounds ?[]  - 0 ?Large Wound Dressing one or multiple wounds ?[]  - 0 ?Application of Medications - topical ?[]  - 0 ?Application of Medications - injection ?INTERVENTIONS - Miscellaneous ?[]  - 0 ?External ear exam ?[]  - 0 ?Specimen Collection (cultures, biopsies, blood, body fluids, etc.) ?[]  - 0 ?Specimen(s) / Culture(s) sent or taken to Lab for analysis ?[]  - 0 ?Patient Transfer (multiple staff / / Similar devices) ?[]  - 0 ?Simple Staple / Suture removal (25 or less) ?[]  - 0 ?Complex Staple / Suture removal (26 or more) ?[]  - 0 ?Hypo / Hyperglycemic Management (close monitor of Blood Glucose) ?[]  - 0 ?Ankle / Brachial Index (ABI) - do not check if billed separately ?X- 1 5 ?Vital Signs ?Has the patient been seen at the hospital within the last three years: Yes ?Total Score: 85 ?Level Of Care: New/Established - Level 3 ?Electronic Signature(s) ?Signed: 11/06/2021 4:57:13 PM By: ?Entered By: on 11/06/2021 15:18:08 ?-------------------------------------------------------------------------------- ?Encounter Discharge Information Details ?Patient Name: Date of Service: ?Laurie Hunter RIA D. 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 ?Weeks in Treatment: 7 ?Encounter Discharge Information Items ?Discharge Condition: Stable ?Ambulatory Status: Wheelchair ?Discharge Destination: Home ?Transportation: Private Auto ?Accompanied By: friend ?Schedule Follow-up Appointment: Yes ?Clinical Summary of Care: Provided on 11/06/2021 ?Form Type Recipient ?Paper Patient Patient ?Electronic Signature(s) ?Signed: 11/06/2021 4:57:13 PM By: Antonieta Iba ?Entered By: Antonieta Iba on 11/06/2021 15:19:14 ?-------------------------------------------------------------------------------- ?Lower Extremity Assessment Details ?Patient Name: Date of Service: ?Laurie Hunter RIA D. 11/06/2021 2:45 PM ?Medical Record Number: [id number] ?Patient Account Number: [id number] ?Date of Birth/Sex: Treating RN: ?[birth year] (76 y.o. Laurie Hunter ?Primary Care Cyndee Giammarco: Georgann Housekeeper Other Clinician: ?Referring Keniyah Gelinas: ?Treating Khang Hannum/Extender: Geralyn Corwin ?Georgann Housekeeper ?Weeks in Treatment: 7 ?Electronic Signature(s) ?Signed: 11/06/2021 4:57:13 PM By: 11/08/2021 ?Entered By: Antonieta Iba on 11/06/2021 14:55:25 ?-------------------------------------------------------------------------------- ?Multi Wound Chart Details ?Patient Name: ?Date of Service: ?Laurie Hunter RIA D. 11/06/2021 2:45 PM ?Medical Record Number: 11/08/2021 ?Patient Account Number: [id number] ?Date of Birth/Sex: ?Treating RN: ?[birth year] (76 y.o. (75 ?Primary Care Jaleyah Longhi: Laurie Hunter ?Other Clinician: ?Referring Avion Kutzer: ?Treating Mailynn Everly/Extender: Georgann Housekeeper ?Geralyn Corwin ?Weeks in Treatment: 7 ?Vital Signs ?Height(in): 58 ?Capillary Blood Glucose(mg/dl): Georgann Housekeeper ?Weight(lbs): ?Pulse(bpm): 120 ?Body Mass Index(BMI): ?Blood Pressure(mmHg): 154/92 ?Temperature(??F): 98.9 ?Respiratory Rate(breaths/min): 22 ?Photos: [N/A:N/A] ?Left, Posterior Lower Leg Left, Medial, Posterior Upper Leg N/A ?Wound Location: ?Shear/Friction Gradually  Appeared N/A ?Wounding Event: ?Venous Leg Ulcer Lymphedema N/A ?Primary Etiology: ?Cataracts, Anemia, Lymphedema, Cataracts, Anemia, Lymphedema, N/A ?Comorbid History: ?Hypertension, Type II Diabetes, Hypertens

## 2021-11-08 DIAGNOSIS — N1831 Chronic kidney disease, stage 3a: Secondary | ICD-10-CM | POA: Diagnosis not present

## 2021-11-08 DIAGNOSIS — E782 Mixed hyperlipidemia: Secondary | ICD-10-CM | POA: Diagnosis not present

## 2021-11-08 DIAGNOSIS — I10 Essential (primary) hypertension: Secondary | ICD-10-CM | POA: Diagnosis not present

## 2021-11-08 DIAGNOSIS — E1122 Type 2 diabetes mellitus with diabetic chronic kidney disease: Secondary | ICD-10-CM | POA: Diagnosis not present

## 2021-11-24 DIAGNOSIS — E782 Mixed hyperlipidemia: Secondary | ICD-10-CM | POA: Diagnosis not present

## 2021-11-24 DIAGNOSIS — M810 Age-related osteoporosis without current pathological fracture: Secondary | ICD-10-CM | POA: Diagnosis not present

## 2021-11-24 DIAGNOSIS — I10 Essential (primary) hypertension: Secondary | ICD-10-CM | POA: Diagnosis not present

## 2021-11-24 DIAGNOSIS — E1122 Type 2 diabetes mellitus with diabetic chronic kidney disease: Secondary | ICD-10-CM | POA: Diagnosis not present

## 2021-11-27 ENCOUNTER — Encounter (HOSPITAL_BASED_OUTPATIENT_CLINIC_OR_DEPARTMENT_OTHER): Payer: Medicare Other | Admitting: Internal Medicine

## 2021-11-30 ENCOUNTER — Encounter (HOSPITAL_BASED_OUTPATIENT_CLINIC_OR_DEPARTMENT_OTHER): Payer: Medicare Other | Attending: Internal Medicine | Admitting: Internal Medicine

## 2021-11-30 DIAGNOSIS — I639 Cerebral infarction, unspecified: Secondary | ICD-10-CM | POA: Diagnosis not present

## 2021-11-30 DIAGNOSIS — E1122 Type 2 diabetes mellitus with diabetic chronic kidney disease: Secondary | ICD-10-CM | POA: Insufficient documentation

## 2021-11-30 DIAGNOSIS — E11622 Type 2 diabetes mellitus with other skin ulcer: Secondary | ICD-10-CM | POA: Diagnosis not present

## 2021-11-30 DIAGNOSIS — L97822 Non-pressure chronic ulcer of other part of left lower leg with fat layer exposed: Secondary | ICD-10-CM | POA: Insufficient documentation

## 2021-11-30 DIAGNOSIS — I129 Hypertensive chronic kidney disease with stage 1 through stage 4 chronic kidney disease, or unspecified chronic kidney disease: Secondary | ICD-10-CM | POA: Diagnosis not present

## 2021-11-30 DIAGNOSIS — N183 Chronic kidney disease, stage 3 unspecified: Secondary | ICD-10-CM | POA: Diagnosis not present

## 2021-11-30 DIAGNOSIS — I890 Lymphedema, not elsewhere classified: Secondary | ICD-10-CM | POA: Diagnosis not present

## 2021-11-30 DIAGNOSIS — E1136 Type 2 diabetes mellitus with diabetic cataract: Secondary | ICD-10-CM | POA: Diagnosis not present

## 2021-11-30 DIAGNOSIS — I69354 Hemiplegia and hemiparesis following cerebral infarction affecting left non-dominant side: Secondary | ICD-10-CM | POA: Insufficient documentation

## 2021-12-01 DIAGNOSIS — L97822 Non-pressure chronic ulcer of other part of left lower leg with fat layer exposed: Secondary | ICD-10-CM | POA: Diagnosis not present

## 2021-12-01 DIAGNOSIS — I890 Lymphedema, not elsewhere classified: Secondary | ICD-10-CM | POA: Diagnosis not present

## 2021-12-01 NOTE — Progress Notes (Signed)
Laurie Hunter, Laurie Hunter ([id number]) Visit Report for 11/30/2021 Chief Complaint Document Details Patient Name: Date of Service: [name], [name] 11/30/2021 2:45 PM Medical Record Number: [id number] Patient Account Number: [id number] Date of Birth/Sex: Treating RN: 04/13/1946 (76 y.o. Laurie Hunter Primary Care Provider: [name] Other Clinician: Referring Provider: Treating Provider/Extender: [name] in Treatment: 11 Information Obtained from: Patient Chief Complaint Left lower extremity wound Electronic Signature(s) Signed: 11/30/2021 4:04:51 PM By: Kalman Shan DO Entered By: Kalman Shan on 11/30/2021 15:30:58 -------------------------------------------------------------------------------- HPI Details Patient Name: Date of Service: [name], [name] 11/30/2021 2:45 PM Medical Record Number: [id number] Patient Account Number: [id number] Date of Birth/Sex: Treating RN: [birth year] (76 y.o. Laurie Hunter Primary Care Provider: [name] Other Clinician: Referring Provider: Treating Provider/Extender: [name] in Treatment: 11 History of Present Illness HPI Description: Admission 09/12/2021 Laurie Hunter is a 76 year old female with a past medical history of type 2 diabetes on oral agents, CVA with left-sided weakness that presents to the clinic for a 1 to 60-month history of wound to her left posterior leg. She states that this started from friction from the mattress she lays on in the chair she sits in. She is not accommodated to help relieve the pressure. She has been followed by her primary care physician for this issue and been treated with 2 rounds of doxycycline. She reports improvement in drainage after taking antibiotics. She currently denies signs of infection. She currently keeps the area covered. 3/8; patient presents for follow-up. She has been using Hydrofera Blue with no issues. She denies  signs of infection. 3/20; patient presents for follow-up. She continues to use [facility] with no issues. She has no questions or concerns today. 4/3; patient presents for follow-up. She has been using Hydrofera Blue with no issues. She denies signs of infection. 4/24; patient presents for follow-up. She been using Hydrofera Blue and antibiotic ointment to the wound beds up until a few days ago when she thought that the wounds had closed. She currently denies any drainage from the previous wound sites. She denies signs of infection. 5/18; patient presents for follow-up. She states she used Hydrofera Blue for a week to the wound bed. She states she has not been doing any dressing changes for the past 1 to 2 weeks. It is unclear why. She currently denies signs of infection. Electronic Signature(s) Signed: 11/30/2021 4:04:51 PM By: Kalman Shan DO Entered By: Kalman Shan on 11/30/2021 15:31:54 -------------------------------------------------------------------------------- Physical Exam Details Patient Name: Date of Service: [name], [name] 11/30/2021 2:45 PM Medical Record Number: [id number] Patient Account Number: [id number] Date of Birth/Sex: Treating RN: 1946/04/27 (76 y.o. Laurie Hunter Primary Care Provider: [name] Other Clinician: Referring Provider: Treating Provider/Extender: Rhetta Mura Weeks in Treatment: 11 Constitutional respirations regular, non-labored and within target range for patient.Marland Kitchen Psychiatric pleasant and cooperative. Notes Left lower extremity: T the posterior thigh located to a significant fat/lymphedema fold there is 1 open wound with granulation tissue. No signs of surrounding o infection. Electronic Signature(s) Signed: 11/30/2021 4:04:51 PM By: Kalman Shan DO Entered By: Kalman Shan on 11/30/2021 15:33:24 -------------------------------------------------------------------------------- Physician Orders  Details Patient Name: Date of Service: [name], [name] 11/30/2021 2:45 PM Medical Record Number: [id number] Patient Account Number: [id number] Date of Birth/Sex: Treating RN: April 22, 1946 (75 y.o. Laurie Hunter Primary Care Provider: [name] Other Clinician: Referring Provider: Treating Provider/Extender: Rhetta Mura [place]  in Treatment: 11 Verbal / Phone Orders: No Diagnosis Coding ICD-10 Coding Code Description [phone] Non-pressure chronic ulcer of other part of left lower leg with fat layer exposed I89.0 Lymphedema, not elsewhere classified I63.9 Cerebral infarction, unspecified E11.622 Type 2 diabetes mellitus with other skin ulcer Follow-up Appointments ppointment in 2 weeks. - 12/14/21 @ 12:30pm with Dr. Heber [place] [facility], Room 7) Return A Bathing/ Shower/ Hygiene May shower and wash wound with soap and water. Off-Loading Other: - ensure no pressure or friction/shear to area to aid in wound healing. Wound Treatment Wound #1 - Lower Leg Wound Laterality: Left, Posterior Cleanser: Soap and Water 2 x Per Week/30 Days Discharge Instructions: May shower and wash wound with dial antibacterial soap and water prior to dressing change. Peri-Wound Care: Skin Prep (DME) (Generic) 2 x Per Week/30 Days Discharge Instructions: Use skin prep as directed Prim Dressing: Promogran Prisma Matrix, 4.34 (sq in) (silver collagen) (DME) (Generic) 2 x Per Week/30 Days ary Discharge Instructions: Moisten collagen with hydrogel Secondary Dressing: ALLEVYN Gentle Border, 3x3 (in/in) (DME) (Generic) 2 x Per Week/30 Days Discharge Instructions: Apply over primary dressing as directed. Electronic Signature(s) Signed: 11/30/2021 4:04:51 PM By: Kalman Shan DO Signed: 11/30/2021 6:05:14 PM By: Lorrin Jackson Entered By: Lorrin Jackson on 11/30/2021 15:39:57 -------------------------------------------------------------------------------- Problem List Details Patient  Name: Date of Service: [name], [name] 11/30/2021 2:45 PM Medical Record Number: [id number] Patient Account Number: [id number] Date of Birth/Sex: Treating RN: 05/30/1946 (76 y.o. Laurie Hunter Primary Care Provider: [name] Other Clinician: Referring Provider: Treating Provider/Extender: [name] in Treatment: 11 Active Problems ICD-10 Encounter Code Description Active Date MDM Diagnosis [phone] Non-pressure chronic ulcer of other part of left lower leg with fat layer exposed2/28/2023 No Yes I89.0 Lymphedema, not elsewhere classified 09/12/2021 No Yes I63.9 Cerebral infarction, unspecified 09/12/2021 No Yes E11.622 Type 2 diabetes mellitus with other skin ulcer 09/12/2021 No Yes Inactive Problems Resolved Problems Electronic Signature(s) Signed: 11/30/2021 4:04:51 PM By: Kalman Shan DO Entered By: Kalman Shan on 11/30/2021 15:30:46 -------------------------------------------------------------------------------- Progress Note Details Patient Name: Date of Service: [name], [name] 11/30/2021 2:45 PM Medical Record Number: [id number] Patient Account Number: [id number] Date of Birth/Sex: Treating RN: 12/13/1945 (76 y.o. Laurie Hunter Primary Care Provider: [name] Other Clinician: Referring Provider: Treating Provider/Extender: [name] in Treatment: 11 Subjective Chief Complaint Information obtained from Patient Left lower extremity wound History of Present Illness (HPI) Admission 09/12/2021 Ms. Sargun Olivencia is a 76 year old female with a past medical history of type 2 diabetes on oral agents, CVA with left-sided weakness that presents to the clinic for a 1 to 17-month history of wound to her left posterior leg. She states that this started from friction from the mattress she lays on in the chair she sits in. She is not accommodated to help relieve the pressure. She has been followed  by her primary care physician for this issue and been treated with 2 rounds of doxycycline. She reports improvement in drainage after taking antibiotics. She currently denies signs of infection. She currently keeps the area covered. 3/8; patient presents for follow-up. She has been using Hydrofera Blue with no issues. She denies signs of infection. 3/20; patient presents for follow-up. She continues to use [facility] with no issues. She has no questions or concerns today. 4/3; patient presents for follow-up. She has been using Hydrofera Blue with no issues. She denies signs of infection. 4/24; patient presents for follow-up. She been using  Hydrofera Blue and antibiotic ointment to the wound beds up until a few days ago when she thought that the wounds had closed. She currently denies any drainage from the previous wound sites. She denies signs of infection. 5/18; patient presents for follow-up. She states she used Hydrofera Blue for a week to the wound bed. She states she has not been doing any dressing changes for the past 1 to 2 weeks. It is unclear why. She currently denies signs of infection. Patient History Information obtained from Patient. Family History Cancer, Diabetes, Heart Disease, Hypertension, Kidney Disease, Lung Disease, Stroke. Social History Never smoker, Marital Status - Single, Alcohol Use - Never, Drug Use - No History, Caffeine Use - Daily. Medical History Eyes Patient has history of Cataracts - both eyes Hematologic/Lymphatic Patient has history of Anemia, Lymphedema - legs Cardiovascular Patient has history of Hypertension Endocrine Patient has history of Type II Diabetes Musculoskeletal Patient has history of Osteoarthritis Medical A Surgical History Notes nd Constitutional Symptoms (General Health) Morbid obesity Chronic kidney disease Stage 3 Hematologic/Lymphatic Hyperlipidemia Cardiovascular Hypercholsterolemia Genitourinary CKD  stageIII Musculoskeletal Osteoporosis Gout Neurologic CVA Left side hemiparesis Objective Constitutional respirations regular, non-labored and within target range for patient.. Vitals Time Taken: 3:08 PM, Height: 58 in, Temperature: 98.5 F, Pulse: 106 bpm, Respiratory Rate: 20 breaths/min, Blood Pressure: 152/88 mmHg. Psychiatric pleasant and cooperative. General Notes: Left lower extremity: T the posterior thigh located to a significant fat/lymphedema fold there is 1 open wound with granulation tissue. No signs o of surrounding infection. Integumentary (Hair, Skin) Wound #1 status is Open. Original cause of wound was Shear/Friction. The date acquired was: 08/16/2020. The wound has been in treatment 11 weeks. The wound is located on the Left,Posterior Lower Leg. The wound measures 0.4cm length x 0.7cm width x 0.1cm depth; 0.22cm^2 area and 0.022cm^3 volume. There is Fat Layer (Subcutaneous Tissue) exposed. There is no tunneling or undermining noted. There is a medium amount of serosanguineous drainage noted. The wound margin is distinct with the outline attached to the wound base. There is large (67-100%) red granulation within the wound bed. There is no necrotic tissue within the wound bed. Assessment Active Problems ICD-10 Non-pressure chronic ulcer of other part of left lower leg with fat layer exposed Lymphedema, not elsewhere classified Cerebral infarction, unspecified Type 2 diabetes mellitus with other skin ulcer Patient's wound is stable. She has not been doing dressing changes. She states she has her sister that can help do dressing changes but can not do this every other day. I would like to switch her to collagen based on the wound surface seen today. I recommended she change this twice weekly. Hopefully this regimen she and her sister can do. Plan Follow-up Appointments: Return Appointment in 2 weeks. - 12/14/21 @ 12:30pm with Dr. Heber [place] [facility] Anna, Room 7) Bathing/ Shower/  Hygiene: May shower and wash wound with soap and water. Off-Loading: Other: - ensure no pressure or friction/shear to area to aid in wound healing. WOUND #1: - Lower Leg Wound Laterality: Left, Posterior Cleanser: Soap and Water 2 x Per Week/30 Days Discharge Instructions: May shower and wash wound with dial antibacterial soap and water prior to dressing change. Peri-Wound Care: Skin Prep (Generic) 2 x Per Week/30 Days Discharge Instructions: Use skin prep as directed Prim Dressing: Promogran Prisma Matrix, 4.34 (sq in) (silver collagen) 2 x Per Week/30 Days ary Discharge Instructions: Moisten collagen with hydrogel Secondary Dressing: Bordered Gauze, 4x4 in (Generic) 2 x Per Week/30 Days Discharge Instructions: Apply over primary  dressing as directed. 1. Collagen with hydrogel twice weekly 2. Follow-up in 2 weeks Electronic Signature(s) Signed: 11/30/2021 4:04:51 PM By: Kalman Shan DO Entered By: Kalman Shan on 11/30/2021 15:35:57 -------------------------------------------------------------------------------- HxROS Details Patient Name: Date of Service: [name], [name] 11/30/2021 2:45 PM Medical Record Number: [id number] Patient Account Number: [id number] Date of Birth/Sex: Treating RN: 1946-07-14 (76 y.o. Laurie Hunter Primary Care Provider: [name] Other Clinician: Referring Provider: Treating Provider/Extender: [name] in Treatment: 11 Information Obtained From Patient Constitutional Symptoms (General Health) Medical History: Past Medical History Notes: Morbid obesity Chronic kidney disease Stage 3 Eyes Medical History: Positive for: Cataracts - both eyes Hematologic/Lymphatic Medical History: Positive for: Anemia; Lymphedema - legs Past Medical History Notes: Hyperlipidemia Cardiovascular Medical History: Positive for: Hypertension Past Medical History Notes: Hypercholsterolemia Endocrine Medical  History: Positive for: Type II Diabetes Time with diabetes: 20+years Treated with: Oral agents Blood sugar tested every day: Yes Tested : Genitourinary Medical History: Past Medical History Notes: CKD stageIII Musculoskeletal Medical History: Positive for: Osteoarthritis Past Medical History Notes: Osteoporosis Gout Neurologic Medical History: Past Medical History Notes: CVA Left side hemiparesis HBO Extended History Items Eyes: Cataracts Immunizations Pneumococcal Vaccine: Received Pneumococcal Vaccination: Yes Received Pneumococcal Vaccination On or After 60th Birthday: No Implantable Devices None Family and Social History Cancer: Yes; Diabetes: Yes; Heart Disease: Yes; Hypertension: Yes; Kidney Disease: Yes; Lung Disease: Yes; Stroke: Yes; Never smoker; Marital Status - Single; Alcohol Use: Never; Drug Use: No History; Caffeine Use: Daily; Financial Concerns: No; Food, Clothing or Shelter Needs: No; Support System Lacking: No; Transportation Concerns: Yes Electronic Signature(s) Signed: 11/30/2021 4:04:51 PM By: Kalman Shan DO Signed: 11/30/2021 6:05:14 PM By: Lorrin Jackson Entered By: Kalman Shan on 11/30/2021 15:32:04 -------------------------------------------------------------------------------- SuperBill Details Patient Name: Date of Service: [name], [name] 11/30/2021 Medical Record Number: [id number] Patient Account Number: [id number] Date of Birth/Sex: Treating RN: 1945-08-03 (76 y.o. Laurie Hunter Primary Care Provider: [name] Other Clinician: Referring Provider: Treating Provider/Extender: [name] in Treatment: 11 Diagnosis Coding ICD-10 Codes Code Description [phone] Non-pressure chronic ulcer of other part of left lower leg with fat layer exposed I89.0 Lymphedema, not elsewhere classified I63.9 Cerebral infarction, unspecified E11.622 Type 2 diabetes mellitus with other skin ulcer Facility  Procedures CPT4 Code: [id number] Description: 99213 - WOUND CARE VISIT-LEV 3 EST PT Modifier: Quantity: 1 Physician Procedures : CPT4 Code Description Modifier [id number] - WC PHYS LEVEL 3 - EST PT ICD-10 Diagnosis Description L97.822 Non-pressure chronic ulcer of other part of left lower leg with fat layer exposed I89.0 Lymphedema, not elsewhere classified I63.9 Cerebral  infarction, unspecified E11.622 Type 2 diabetes mellitus with other skin ulcer Quantity: 1 Electronic Signature(s) Signed: 11/30/2021 4:04:51 PM By: Kalman Shan DO Entered By: Kalman Shan on 11/30/2021 15:36:10

## 2021-12-06 DIAGNOSIS — I10 Essential (primary) hypertension: Secondary | ICD-10-CM | POA: Diagnosis not present

## 2021-12-14 ENCOUNTER — Encounter (HOSPITAL_BASED_OUTPATIENT_CLINIC_OR_DEPARTMENT_OTHER): Payer: Medicare Other | Attending: Internal Medicine | Admitting: Internal Medicine

## 2021-12-14 DIAGNOSIS — E1122 Type 2 diabetes mellitus with diabetic chronic kidney disease: Secondary | ICD-10-CM | POA: Diagnosis not present

## 2021-12-14 DIAGNOSIS — I890 Lymphedema, not elsewhere classified: Secondary | ICD-10-CM | POA: Diagnosis not present

## 2021-12-14 DIAGNOSIS — I639 Cerebral infarction, unspecified: Secondary | ICD-10-CM | POA: Diagnosis not present

## 2021-12-14 DIAGNOSIS — I129 Hypertensive chronic kidney disease with stage 1 through stage 4 chronic kidney disease, or unspecified chronic kidney disease: Secondary | ICD-10-CM | POA: Diagnosis not present

## 2021-12-14 DIAGNOSIS — E785 Hyperlipidemia, unspecified: Secondary | ICD-10-CM | POA: Insufficient documentation

## 2021-12-14 DIAGNOSIS — M1990 Unspecified osteoarthritis, unspecified site: Secondary | ICD-10-CM | POA: Insufficient documentation

## 2021-12-14 DIAGNOSIS — E1136 Type 2 diabetes mellitus with diabetic cataract: Secondary | ICD-10-CM | POA: Insufficient documentation

## 2021-12-14 DIAGNOSIS — E11621 Type 2 diabetes mellitus with foot ulcer: Secondary | ICD-10-CM | POA: Insufficient documentation

## 2021-12-14 DIAGNOSIS — I69354 Hemiplegia and hemiparesis following cerebral infarction affecting left non-dominant side: Secondary | ICD-10-CM | POA: Insufficient documentation

## 2021-12-14 DIAGNOSIS — E11622 Type 2 diabetes mellitus with other skin ulcer: Secondary | ICD-10-CM

## 2021-12-14 DIAGNOSIS — L97822 Non-pressure chronic ulcer of other part of left lower leg with fat layer exposed: Secondary | ICD-10-CM

## 2021-12-14 DIAGNOSIS — N183 Chronic kidney disease, stage 3 unspecified: Secondary | ICD-10-CM | POA: Diagnosis not present

## 2021-12-14 NOTE — Progress Notes (Signed)
AHSAKI, FESPERMAN ([id number]) Visit Report for 12/14/2021 Chief Complaint Document Details Patient Name: Date of Service: [name], [name] 12/14/2021 12:30 PM Medical Record Number: [id number] Patient Account Number: [id number] Date of Birth/Sex: Treating RN: 12-09-1945 (76 y.o. Laurie Hunter Primary Care Provider: [name] Other Clinician: Referring Provider: Treating Provider/Extender: [name] in Treatment: 13 Information Obtained from: Patient Chief Complaint Left lower extremity wound Electronic Signature(s) Signed: 12/14/2021 1:29:37 PM By: Kalman Shan DO Entered By: Kalman Shan on 12/14/2021 13:16:09 -------------------------------------------------------------------------------- Debridement Details Patient Name: Date of Service: [name], [name] 12/14/2021 12:30 PM Medical Record Number: [id number] Patient Account Number: [id number] Date of Birth/Sex: Treating RN: November 04, 1945 (76 y.o. Laurie Hunter Primary Care Provider: [name] Other Clinician: Referring Provider: Treating Provider/Extender: [name] in Treatment: 13 Debridement Performed for Assessment: Wound #1 Left,Posterior Lower Leg Performed By: Physician Kalman Shan, DO Debridement Type: Debridement Severity of Tissue Pre Debridement: Fat layer exposed Level of Consciousness (Pre-procedure): Awake and Alert Pre-procedure Verification/Time Out Yes - 13:01 Taken: Start Time: 13:02 T Area Debrided (L x W): otal 0.5 (cm) x 1 (cm) = 0.5 (cm) Tissue and other material debrided: Non-Viable, Slough, Subcutaneous, Slough Level: Skin/Subcutaneous Tissue Debridement Description: Excisional Instrument: Curette Bleeding: Minimum Hemostasis Achieved: Pressure End Time: 13:04 Response to Treatment: Procedure was tolerated well Level of Consciousness (Post- Awake and Alert procedure): Post Debridement Measurements of Total  Wound Length: (cm) 0.5 Width: (cm) 1 Depth: (cm) 0.1 Volume: (cm) 0.039 Character of Wound/Ulcer Post Debridement: Stable Severity of Tissue Post Debridement: Fat layer exposed Post Procedure Diagnosis Same as Pre-procedure Electronic Signature(s) Signed: 12/14/2021 1:29:37 PM By: Kalman Shan DO Signed: 12/14/2021 3:50:29 PM By: Lorrin Jackson Entered By: Lorrin Jackson on 12/14/2021 13:07:38 -------------------------------------------------------------------------------- Debridement Details Patient Name: Date of Service: [name], [name] 12/14/2021 12:30 PM Medical Record Number: [id number] Patient Account Number: [id number] Date of Birth/Sex: Treating RN: April 14, 1946 (76 y.o. Laurie Hunter Primary Care Provider: [name] Other Clinician: Referring Provider: Treating Provider/Extender: [name] in Treatment: 13 Debridement Performed for Assessment: Wound #3 Left,Lateral,Posterior Upper Leg Performed By: Physician Kalman Shan, DO Debridement Type: Debridement Level of Consciousness (Pre-procedure): Awake and Alert Pre-procedure Verification/Time Out Yes - 13:01 Taken: Start Time: 13:04 T Area Debrided (L x W): otal 0.2 (cm) x 0.2 (cm) = 0.04 (cm) Tissue and other material debrided: Non-Viable, Slough, Subcutaneous, Slough Level: Skin/Subcutaneous Tissue Debridement Description: Excisional Instrument: Curette Bleeding: Minimum Hemostasis Achieved: Pressure End Time: 13:06 Response to Treatment: Procedure was tolerated well Level of Consciousness (Post- Awake and Alert procedure): Post Debridement Measurements of Total Wound Length: (cm) 0.2 Width: (cm) 0.2 Depth: (cm) 0.1 Volume: (cm) 0.003 Character of Wound/Ulcer Post Debridement: Stable Post Procedure Diagnosis Same as Pre-procedure Electronic Signature(s) Signed: 12/14/2021 1:29:37 PM By: Kalman Shan DO Signed: 12/14/2021 3:50:29 PM By: Lorrin Jackson Entered  By: Lorrin Jackson on 12/14/2021 13:08:10 -------------------------------------------------------------------------------- HPI Details Patient Name: Date of Service: [name], [name] 12/14/2021 12:30 PM Medical Record Number: [id number] Patient Account Number: [id number] Date of Birth/Sex: Treating RN: 04/15/1946 (76 y.o. Laurie Hunter Primary Care Provider: [name] Other Clinician: Referring Provider: Treating Provider/Extender: [name] in Treatment: 13 History of Present Illness HPI Description: Admission 09/12/2021 Ms. Laurie Hunter is a 76 year old female with a past medical history of type 2 diabetes on oral agents, CVA with left-sided weakness that presents to the clinic for a  1 to 102-month history of wound to her left posterior leg. She states that this started from friction from the mattress she lays on in the chair she sits in. She is not accommodated to help relieve the pressure. She has been followed by her primary care physician for this issue and been treated with 2 rounds of doxycycline. She reports improvement in drainage after taking antibiotics. She currently denies signs of infection. She currently keeps the area covered. 3/8; patient presents for follow-up. She has been using Hydrofera Blue with no issues. She denies signs of infection. 3/20; patient presents for follow-up. She continues to use [facility] with no issues. She has no questions or concerns today. 4/3; patient presents for follow-up. She has been using Hydrofera Blue with no issues. She denies signs of infection. 4/24; patient presents for follow-up. She been using Hydrofera Blue and antibiotic ointment to the wound beds up until a few days ago when she thought that the wounds had closed. She currently denies any drainage from the previous wound sites. She denies signs of infection. 5/18; patient presents for follow-up. She states she used Hydrofera Blue for a week to  the wound bed. She states she has not been doing any dressing changes for the past 1 to 2 weeks. It is unclear why. She currently denies signs of infection. 6/1; patient presents for follow-up. She has been using collagen to the wound bed twice weekly. There is no collagen noted today on intake. She denies signs of infection. She states that a new wound formed adjacent to the previous wound. Electronic Signature(s) Signed: 12/14/2021 1:29:37 PM By: Kalman Shan DO Entered By: Kalman Shan on 12/14/2021 13:18:17 -------------------------------------------------------------------------------- Physical Exam Details Patient Name: Date of Service: [name], [name] 12/14/2021 12:30 PM Medical Record Number: [id number] Patient Account Number: [id number] Date of Birth/Sex: Treating RN: 1945-12-09 (76 y.o. Laurie Hunter Primary Care Provider: [name] Other Clinician: Referring Provider: Treating Provider/Extender: Rhetta Mura Weeks in Treatment: 13 Constitutional respirations regular, non-labored and within target range for patient.Marland Kitchen Psychiatric pleasant and cooperative. Notes Left lower extremity: T the posterior thigh located to a significant fat/lymphedema fold there are 2 open wounds with granulation tissue and non viable tissue. o No signs of surrounding infection. Electronic Signature(s) Signed: 12/14/2021 1:29:37 PM By: Kalman Shan DO Entered By: Kalman Shan on 12/14/2021 13:19:03 -------------------------------------------------------------------------------- Physician Orders Details Patient Name: Date of Service: [name], [name] 12/14/2021 12:30 PM Medical Record Number: [id number] Patient Account Number: [id number] Date of Birth/Sex: Treating RN: Nov 29, 1945 (76 y.o. Laurie Hunter Primary Care Provider: [name] Other Clinician: Referring Provider: Treating Provider/Extender: [name] in  Treatment: [phone] Verbal / Phone Orders: No Diagnosis Coding Follow-up Appointments ppointment in 2 weeks. - 12/28/21 @ 2:45pm with Dr. Heber Lookingglass Leveda Anna, Room 7) Return A Bathing/ Shower/ Hygiene May shower and wash wound with soap and water. Off-Loading Other: - ensure no pressure or friction/shear to area to aid in wound healing. Wound Treatment Wound #1 - Lower Leg Wound Laterality: Left, Posterior Cleanser: Soap and Water 1 x Per Day/30 Days Discharge Instructions: May shower and wash wound with dial antibacterial soap and water prior to dressing change. Peri-Wound Care: Skin Prep (DME) (Generic) 1 x Per Day/30 Days Discharge Instructions: Use skin prep as directed Prim Dressing: MediHoney Gel, tube 1.5 (oz) 1 x Per Day/30 Days ary Discharge Instructions: Apply to wound bed as instructed Secondary Dressing: ALLEVYN Gentle Border, 3x3 (in/in) (DME) (  Generic) 1 x Per Day/30 Days Discharge Instructions: Apply over primary dressing as directed. Wound #3 - Upper Leg Wound Laterality: Left, Lateral, Posterior Cleanser: Soap and Water 1 x Per Day/30 Days Discharge Instructions: May shower and wash wound with dial antibacterial soap and water prior to dressing change. Peri-Wound Care: Skin Prep (DME) (Generic) 1 x Per Day/30 Days Discharge Instructions: Use skin prep as directed Prim Dressing: MediHoney Gel, tube 1.5 (oz) 1 x Per Day/30 Days ary Discharge Instructions: Apply to wound bed as instructed Secondary Dressing: ALLEVYN Gentle Border, 3x3 (in/in) (DME) (Generic) 1 x Per Day/30 Days Discharge Instructions: Apply over primary dressing as directed. Electronic Signature(s) Signed: 12/14/2021 1:29:37 PM By: Kalman Shan DO Entered By: Kalman Shan on 12/14/2021 13:19:12 -------------------------------------------------------------------------------- Problem List Details Patient Name: Date of Service: [name], [name] 12/14/2021 12:30 PM Medical Record Number: [id number] Patient  Account Number: [id number] Date of Birth/Sex: Treating RN: [birth year] (75 y.o. 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 12/14/2021 12:30 PM Medical Record Number: [id number] Patient Account Number: [id number] Date of Birth/Sex: Treating RN: 02-09-1946 (76 y.o. Laurie Hunter Primary Care Provider: [name] Other Clinician: Referring Provider: Treating Provider/Extender: [name] in Treatment: 13 Subjective Chief Complaint Information obtained from Patient Left lower extremity wound History of Present Illness (HPI) Admission 09/12/2021 Ms. Apolonia Mcclenahan is a 76 year old female with a past medical history of type 2 diabetes on oral agents, CVA with left-sided weakness that presents to the clinic for a 1 to 40-month history of wound to her left posterior leg. She states that this started from friction from the mattress she lays on in the chair she sits in. She is not accommodated to help relieve the pressure. She has been followed by her primary care physician for this issue and been treated with 2 rounds of doxycycline. She reports  improvement in drainage after taking antibiotics. She currently denies signs of infection. She currently keeps the area covered. 3/8; patient presents for follow-up. She has been using Hydrofera Blue with no issues. She denies signs of infection. 3/20; patient presents for follow-up. She continues to use [facility] with no issues. She has no questions or concerns today. 4/3; patient presents for follow-up. She has been using Hydrofera Blue with no issues. She denies signs of infection. 4/24; patient presents for follow-up. She been using Hydrofera Blue and antibiotic ointment to the wound beds up until a few days ago when she thought that the wounds had closed. She currently denies any drainage from the previous wound sites. She denies signs of infection. 5/18; patient presents for follow-up. She states she used Hydrofera Blue for a week to the wound bed. She states she has not been doing any dressing changes for the past 1 to 2 weeks. It is unclear why. She currently denies signs of infection. 6/1; patient presents for follow-up. She  has been using collagen to the wound bed twice weekly. There is no collagen noted today on intake. She denies signs of infection. She states that a new wound formed adjacent to the previous wound. Patient History Information obtained from Patient. Family History Cancer, Diabetes, Heart Disease, Hypertension, Kidney Disease, Lung Disease, Stroke. Social History Never smoker, Marital Status - Single, Alcohol Use - Never, Drug Use - No History, Caffeine Use - Daily. Medical History Eyes Patient has history of Cataracts - both eyes Hematologic/Lymphatic Patient has history of Anemia, Lymphedema - legs Cardiovascular Patient has history of Hypertension Endocrine Patient has history of Type II Diabetes Musculoskeletal Patient has history of Osteoarthritis Medical A Surgical History Notes nd Constitutional Symptoms (General Health) Morbid obesity Chronic  kidney disease Stage 3 Hematologic/Lymphatic Hyperlipidemia Cardiovascular Hypercholsterolemia Genitourinary CKD stageIII Musculoskeletal Osteoporosis Gout Neurologic CVA Left side hemiparesis Objective Constitutional respirations regular, non-labored and within target range for patient.. Vitals Time Taken: 12:43 PM, Height: 58 in, Temperature: 98.5 F, Pulse: 105 bpm, Respiratory Rate: 18 breaths/min, Blood Pressure: 144/86 mmHg. Psychiatric pleasant and cooperative. General Notes: Left lower extremity: T the posterior thigh located to a significant fat/lymphedema fold there are 2 open wounds with granulation tissue and non o viable tissue. No signs of surrounding infection. Integumentary (Hair, Skin) Wound #1 status is Open. Original cause of wound was Shear/Friction. The date acquired was: 08/16/2020. The wound has been in treatment 13 weeks. The wound is located on the Left,Posterior Lower Leg. The wound measures 0.5cm length x 1cm width x 0.1cm depth; 0.393cm^2 area and 0.039cm^3 volume. There is Fat Layer (Subcutaneous Tissue) exposed. There is no tunneling or undermining noted. There is a medium amount of serosanguineous drainage noted. The wound margin is distinct with the outline attached to the wound base. There is large (67-100%) red granulation within the wound bed. There is no necrotic tissue within the wound bed. Wound #3 status is Open. Original cause of wound was Gradually Appeared. The date acquired was: 12/11/2021. The wound is located on the Left,Lateral,Posterior Upper Leg. The wound measures 0.2cm length x 0.2cm width x 0.1cm depth; 0.031cm^2 area and 0.003cm^3 volume. There is Fat Layer (Subcutaneous Tissue) exposed. There is no tunneling or undermining noted. There is a medium amount of serosanguineous drainage noted. The wound margin is distinct with the outline attached to the wound base. There is large (67-100%) red granulation within the wound bed. There is no  necrotic tissue within the wound bed. Assessment Active Problems ICD-10 Non-pressure chronic ulcer of other part of left lower leg with fat layer exposed Lymphedema, not elsewhere classified Cerebral infarction, unspecified Type 2 diabetes mellitus with other skin ulcer Patient's wound is stable. She now has a new wound just adjacent to this. I debrided nonviable tissue. I recommended switching the dressing to Medihoney. I did recommend she change this daily. She states she has support at home that should be able to help her. I recommended offloading this area the best she can. It is over a significant fat/lymphedema fold. I recommend weekly follow-up however patient states she cannot come but every 2 to 3 weeks. She knows that her wound will be delayed in healing due to this. Procedures Wound #1 Pre-procedure diagnosis of Wound #1 is a Venous Leg Ulcer located on the Left,Posterior Lower Leg .Severity of Tissue Pre Debridement is: Fat layer exposed. There was a Excisional Skin/Subcutaneous Tissue Debridement with a total area of 0.5 sq cm performed by Kalman Shan, DO. With the following instrument(s): Curette to  remove Non-Viable tissue/material. Material removed includes Subcutaneous Tissue and Slough and. No specimens were taken. A time out was conducted at 13:01, prior to the start of the procedure. A Minimum amount of bleeding was controlled with Pressure. The procedure was tolerated well. Post Debridement Measurements: 0.5cm length x 1cm width x 0.1cm depth; 0.039cm^3 volume. Character of Wound/Ulcer Post Debridement is stable. Severity of Tissue Post Debridement is: Fat layer exposed. Post procedure Diagnosis Wound #1: Same as Pre-Procedure Wound #3 Pre-procedure diagnosis of Wound #3 is a Lymphedema located on the Left,Lateral,Posterior Upper Leg . There was a Excisional Skin/Subcutaneous Tissue Debridement with a total area of 0.04 sq cm performed by Kalman Shan, DO. With  the following instrument(s): Curette to remove Non-Viable tissue/material. Material removed includes Subcutaneous Tissue and Slough and. No specimens were taken. A time out was conducted at 13:01, prior to the start of the procedure. A Minimum amount of bleeding was controlled with Pressure. The procedure was tolerated well. Post Debridement Measurements: 0.2cm length x 0.2cm width x 0.1cm depth; 0.003cm^3 volume. Character of Wound/Ulcer Post Debridement is stable. Post procedure Diagnosis Wound #3: Same as Pre-Procedure Plan Follow-up Appointments: Return Appointment in 2 weeks. - 12/28/21 @ 2:45pm with Dr. Heber Tony Leveda Anna, Room 7) Bathing/ Shower/ Hygiene: May shower and wash wound with soap and water. Off-Loading: Other: - ensure no pressure or friction/shear to area to aid in wound healing. WOUND #1: - Lower Leg Wound Laterality: Left, Posterior Cleanser: Soap and Water 1 x Per Day/30 Days Discharge Instructions: May shower and wash wound with dial antibacterial soap and water prior to dressing change. Peri-Wound Care: Skin Prep (DME) (Generic) 1 x Per Day/30 Days Discharge Instructions: Use skin prep as directed Prim Dressing: MediHoney Gel, tube 1.5 (oz) 1 x Per Day/30 Days ary Discharge Instructions: Apply to wound bed as instructed Secondary Dressing: ALLEVYN Gentle Border, 3x3 (in/in) (DME) (Generic) 1 x Per Day/30 Days Discharge Instructions: Apply over primary dressing as directed. WOUND #3: - Upper Leg Wound Laterality: Left, Lateral, Posterior Cleanser: Soap and Water 1 x Per Day/30 Days Discharge Instructions: May shower and wash wound with dial antibacterial soap and water prior to dressing change. Peri-Wound Care: Skin Prep (DME) (Generic) 1 x Per Day/30 Days Discharge Instructions: Use skin prep as directed Prim Dressing: MediHoney Gel, tube 1.5 (oz) 1 x Per Day/30 Days ary Discharge Instructions: Apply to wound bed as instructed Secondary Dressing: ALLEVYN Gentle  Border, 3x3 (in/in) (DME) (Generic) 1 x Per Day/30 Days Discharge Instructions: Apply over primary dressing as directed. 1. In office sharp debridement 2. Medihoney 3. Follow-up in 2 weeks. Electronic Signature(s) Signed: 12/14/2021 1:29:37 PM By: Kalman Shan DO Entered By: Kalman Shan on 12/14/2021 13:28:34 -------------------------------------------------------------------------------- HxROS Details Patient Name: Date of Service: [name], [name] 12/14/2021 12:30 PM Medical Record Number: [id number] Patient Account Number: [id number] Date of Birth/Sex: Treating RN: 12-Dec-1945 (76 y.o. Laurie Hunter Primary Care Provider: [name] Other Clinician: Referring Provider: Treating Provider/Extender: [name] in Treatment: 13 Information Obtained From Patient Constitutional Symptoms (General Health) Medical History: Past Medical History Notes: Morbid obesity Chronic kidney disease Stage 3 Eyes Medical History: Positive for: Cataracts - both eyes Hematologic/Lymphatic Medical History: Positive for: Anemia; Lymphedema - legs Past Medical History Notes: Hyperlipidemia Cardiovascular Medical History: Positive for: Hypertension Past Medical History Notes: Hypercholsterolemia Endocrine Medical History: Positive for: Type II Diabetes Time with diabetes: 20+years Treated with: Oral agents Blood sugar tested every day: Yes Tested : Genitourinary Medical History:  Past Medical History Notes: CKD stageIII Musculoskeletal Medical History: Positive for: Osteoarthritis Past Medical History Notes: Osteoporosis Gout Neurologic Medical History: Past Medical History Notes: CVA Left side hemiparesis HBO Extended History Items Eyes: Cataracts Immunizations Pneumococcal Vaccine: Received Pneumococcal Vaccination: Yes Received Pneumococcal Vaccination On or After 60th Birthday: No Implantable Devices None Family and Social  History Cancer: Yes; Diabetes: Yes; Heart Disease: Yes; Hypertension: Yes; Kidney Disease: Yes; Lung Disease: Yes; Stroke: Yes; Never smoker; Marital Status - Single; Alcohol Use: Never; Drug Use: No History; Caffeine Use: Daily; Financial Concerns: No; Food, Clothing or Shelter Needs: No; Support System Lacking: No; Transportation Concerns: Yes Electronic Signature(s) Signed: 12/14/2021 1:29:37 PM By: Kalman Shan DO Signed: 12/14/2021 3:50:29 PM By: Lorrin Jackson Entered By: Kalman Shan on 12/14/2021 13:18:21 -------------------------------------------------------------------------------- SuperBill Details Patient Name: Date of Service: [name], [name] 12/14/2021 Medical Record Number: [id number] Patient Account Number: [id number] Date of Birth/Sex: Treating RN: 19-Mar-1946 (76 y.o. Laurie Hunter Primary Care Provider: [name] Other Clinician: Referring Provider: Treating Provider/Extender: [name] in Treatment: 13 Diagnosis Coding ICD-10 Codes Code Description [phone] Non-pressure chronic ulcer of other part of left lower leg with fat layer exposed I89.0 Lymphedema, not elsewhere classified I63.9 Cerebral infarction, unspecified E11.622 Type 2 diabetes mellitus with other skin ulcer Facility Procedures CPT4 Code: [id number] Description: 11042 - DEB SUBQ TISSUE 20 SQ CM/< ICD-10 Diagnosis Description L97.822 Non-pressure chronic ulcer of other part of left lower leg with fat layer expo Modifier: sed Quantity: 1 Physician Procedures : CPT4 Code Description Modifier [id number] - WC PHYS LEVEL 2 - EST PT ICD-10 Diagnosis Description L97.822 Non-pressure chronic ulcer of other part of left lower leg with fat layer exposed I89.0 Lymphedema, not elsewhere classified I63.9 Cerebral  infarction, unspecified E11.622 Type 2 diabetes mellitus with other skin ulcer Quantity: 1 : [id number] - WC PHYS SUBQ TISS 20 SQ CM ICD-10 Diagnosis  Description L97.822 Non-pressure chronic ulcer of other part of left lower leg with fat layer exposed Quantity: 1 Electronic Signature(s) Signed: 12/14/2021 1:29:37 PM By: Kalman Shan DO Entered By: Kalman Shan on 12/14/2021 13:28:49

## 2021-12-14 NOTE — Progress Notes (Signed)
TOVE, KOHLMEIER ([id number]) Visit Report for 12/14/2021 Arrival Information Details Patient Name: Date of Service: [name], [name] 12/14/2021 12:30 PM Medical Record Number: [id number] Patient Account Number: [id number] Date of Birth/Sex: Treating RN: 03-Jun-1946 (76 y.o. [name] Primary Care Shoua Ulloa: [name] Other Clinician: Referring Keziah Drotar: Treating Michall Noffke/Extender: [name] in Treatment: 32 Visit Information History Since Last Visit Added or deleted any medications: No Patient Arrived: Wheel Chair Any new allergies or adverse reactions: No Arrival Time: 12:42 Had a fall or experienced change in No Accompanied By: Friend activities of daily living that may affect Transfer Assistance: Manual risk of falls: Patient Identification Verified: Yes Signs or symptoms of abuse/neglect since last visito No Secondary Verification Process Completed: Yes Hospitalized since last visit: No Patient Requires Transmission-Based Precautions: No Implantable device outside of the clinic excluding No Patient Has Alerts: Yes cellular tissue based products placed in the center Patient Alerts: Patient on Blood Thinner since last visit: Has Dressing in Place as Prescribed: Yes Pain Present Now: No Electronic Signature(s) Signed: 12/14/2021 3:50:29 PM By: Lorrin Jackson Entered By: Lorrin Jackson on 12/14/2021 12:42:53 -------------------------------------------------------------------------------- Encounter Discharge Information Details Patient Name: Date of Service: [name], [name] 12/14/2021 12:30 PM Medical Record Number: [id number] Patient Account Number: [id number] Date of Birth/Sex: Treating RN: [birth year] (76 y.o. [name] Primary Care Ruben Pyka: [name] Other Clinician: Referring Sankalp Ferrell: Treating Shenekia Riess/Extender: [name] in Treatment: 13 Encounter Discharge Information Items Post  Procedure Vitals Discharge Condition: Stable Temperature (F): 98.5 Ambulatory Status: Wheelchair Pulse (bpm): 105 Discharge Destination: Home Respiratory Rate (breaths/min): 18 Transportation: Private Auto Blood Pressure (mmHg): 144/86 Accompanied By: friend Schedule Follow-up Appointment: Yes Clinical Summary of Care: Provided on 12/14/2021 Form Type Recipient Paper Patient Patient Electronic Signature(s) Signed: 12/14/2021 3:50:29 PM By: Lorrin Jackson Entered By: Lorrin Jackson on 12/14/2021 13:25:06 -------------------------------------------------------------------------------- Lower Extremity Assessment Details Patient Name: Date of Service: [name], [name] 12/14/2021 12:30 PM Medical Record Number: [id number] Patient Account Number: [id number] Date of Birth/Sex: Treating RN: 1946-05-02 (76 y.o. [name] Primary Care Loyalty Arentz: [name] Other Clinician: Referring Ima Hafner: Treating Ali Mohl/Extender: Rhetta Mura Weeks in Treatment: 13 Electronic Signature(s) Signed: 12/14/2021 3:50:29 PM By: Lorrin Jackson Entered By: Lorrin Jackson on 12/14/2021 12:43:32 -------------------------------------------------------------------------------- Multi Wound Chart Details Patient Name: Date of Service: [name], [name] 12/14/2021 12:30 PM Medical Record Number: [id number] Patient Account Number: [id number] Date of Birth/Sex: Treating RN: September 24, 1945 (76 y.o. [name] Primary Care Ariany Kesselman: [name] Other Clinician: Referring Ashwini Jago: Treating Javaya [place]/Extender: [name] in Treatment: 13 Vital Signs Height(in): 3 Pulse(bpm): 105 Weight(lbs): Blood Pressure(mmHg): 144/86 Body Mass Index(BMI): Temperature(F): 98.5 Respiratory Rate(breaths/min): 18 Photos: [3:No Photos] [N/A:N/A] Left, Posterior Lower Leg Left, Lateral, Posterior Upper Leg N/A Wound Location: Shear/Friction Gradually Appeared  N/A Wounding Event: Venous Leg Ulcer Lymphedema N/A Primary Etiology: Cataracts, Anemia, Lymphedema, Cataracts, Anemia, Lymphedema, N/A Comorbid History: Hypertension, Type II Diabetes, Hypertension, Type II Diabetes, Osteoarthritis Osteoarthritis 08/16/2020 12/11/2021 N/A Date Acquired: 13 0 N/A Weeks of Treatment: Open Open N/A Wound Status: No No N/A Wound Recurrence: 0.5x1x0.1 0.2x0.2x0.1 N/A Measurements L x W x D (cm) 0.393 0.031 N/A A (cm) : rea 0.039 0.003 N/A Volume (cm) : 95.60% N/A N/A % Reduction in Area: 95.60% N/A N/A % Reduction in Volume: Full Thickness Without Exposed Full Thickness Without Exposed N/A Classification: Support Structures Support Structures Medium Medium N/A Exudate Amount: Serosanguineous Serosanguineous  N/A Exudate Type: red, brown red, brown N/A Exudate Color: Distinct, outline attached Distinct, outline attached N/A Wound Margin: Large (67-100%) Large (67-100%) N/A Granulation Amount: Red Red N/A Granulation Quality: None Present (0%) None Present (0%) N/A Necrotic Amount: Fat Layer (Subcutaneous Tissue): Yes Fat Layer (Subcutaneous Tissue): Yes N/A Exposed Structures: Fascia: No Fascia: No Tendon: No Tendon: No Muscle: No Muscle: No Joint: No Joint: No Bone: No Bone: No Large (67-100%) None N/A Epithelialization: Debridement - Excisional Debridement - Excisional N/A Debridement: Pre-procedure Verification/Time Out 13:01 13:01 N/A Taken: Subcutaneous, Slough Subcutaneous, Slough N/A Tissue Debrided: Skin/Subcutaneous Tissue Skin/Subcutaneous Tissue N/A Level: 0.5 0.04 N/A Debridement A (sq cm): rea Curette Curette N/A Instrument: Minimum Minimum N/A Bleeding: Pressure Pressure N/A Hemostasis A chieved: Procedure was tolerated well Procedure was tolerated well N/A Debridement Treatment Response: 0.5x1x0.1 0.2x0.2x0.1 N/A Post Debridement Measurements L x W x D (cm) 0.039 0.003 N/A Post Debridement  Volume: (cm) Debridement Debridement N/A Procedures Performed: Treatment Notes Electronic Signature(s) Signed: 12/14/2021 1:29:37 PM By: Kalman Shan DO Signed: 12/14/2021 3:50:29 PM By: Lorrin Jackson Entered By: Kalman Shan on 12/14/2021 13:16:01 -------------------------------------------------------------------------------- Multi-Disciplinary Care Plan Details Patient Name: Date of Service: [name], [name] 12/14/2021 12:30 PM Medical Record Number: [id number] Patient Account Number: [id number] Date of Birth/Sex: Treating RN: 11/07/1945 (76 y.o. [name] Primary Care Roberta Angell: [name] Other Clinician: Referring Marton Malizia: Treating Yuritzi Kamp/Extender: [name] in Treatment: 13 Active Inactive Wound/Skin Impairment Nursing Diagnoses: Knowledge deficit related to ulceration/compromised skin integrity Goals: Patient/caregiver will verbalize understanding of skin care regimen Date Initiated: 09/12/2021 Target Resolution Date: 01/11/2022 Goal Status: Active Interventions: Assess patient/caregiver ability to obtain necessary supplies Assess patient/caregiver ability to perform ulcer/skin care regimen upon admission and as needed Provide education on ulcer and skin care Treatment Activities: Skin care regimen initiated : 09/12/2021 Topical wound management initiated : 09/12/2021 Notes: 10/02/21: Wound care regimen continues Electronic Signature(s) Signed: 12/14/2021 3:50:29 PM By: Lorrin Jackson Entered By: Lorrin Jackson on 12/14/2021 12:48:50 -------------------------------------------------------------------------------- Pain Assessment Details Patient Name: Date of Service: [name], [name] 12/14/2021 12:30 PM Medical Record Number: [id number] Patient Account Number: [id number] Date of Birth/Sex: Treating RN: 11-05-1945 (76 y.o. [name] Primary Care Diany Formosa: [name] Other Clinician: Referring  Gail Creekmore: Treating Kaden Daughdrill/Extender: [name] in Treatment: 13 Active Problems Location of Pain Severity and Description of Pain Patient Has Paino No Site Locations Pain Management and Medication Current Pain Management: Electronic Signature(s) Signed: 12/14/2021 3:50:29 PM By: Lorrin Jackson Entered By: Lorrin Jackson on 12/14/2021 12:45:31 -------------------------------------------------------------------------------- Patient/Caregiver Education Details Patient Name: Date of Service: [name], [name] 6/1/2023andnbsp12:30 PM Medical Record Number: [id number] Patient Account Number: [id number] Date of Birth/Gender: Treating RN: 24-Mar-1946 (76 y.o. [name] Primary Care Physician: [name] Other Clinician: Referring Physician: Treating Physician/Extender: [name] in Treatment: 13 Education Assessment Education Provided To: Patient Education Topics Provided Wound/Skin Impairment: Methods: Explain/Verbal, Printed Responses: State content correctly [employer]) Signed: 12/14/2021 3:50:29 PM By: Lorrin Jackson Entered By: Lorrin Jackson on 12/14/2021 12:49:11 -------------------------------------------------------------------------------- Wound Assessment Details Patient Name: Date of Service: [name], [name] 12/14/2021 12:30 PM Medical Record Number: [id number] Patient Account Number: [id number] Date of Birth/Sex: Treating RN: 06/26/1946 (76 y.o. [name] Primary Care Jamair Cato: [name] Other Clinician: Referring Margie Urbanowicz: Treating Jahkai Yandell/Extender: Rhetta Mura Weeks in Treatment: 13 Wound Status Wound Number: 1 Primary Venous Leg Ulcer Etiology: Wound Location: Left, Posterior Lower  Leg Wound Open Wounding Event: Shear/Friction Status: Date Acquired: 08/16/2020 Comorbid Cataracts, Anemia, Lymphedema, Hypertension, Type II Weeks Of Treatment:  13 History: Diabetes, Osteoarthritis Clustered Wound: No Photos Wound Measurements Length: (cm) 0.5 Width: (cm) 1 Depth: (cm) 0.1 Area: (cm) 0.393 Volume: (cm) 0.039 % Reduction in Area: 95.6% % Reduction in Volume: 95.6% Epithelialization: Large (67-100%) Tunneling: No Undermining: No Wound Description Classification: Full Thickness Without Exposed Support Structures Wound Margin: Distinct, outline attached Exudate Amount: Medium Exudate Type: Serosanguineous Exudate Color: red, brown Foul Odor After Cleansing: No Slough/Fibrino No Wound Bed Granulation Amount: Large (67-100%) Exposed Structure Granulation Quality: Red Fascia Exposed: No Necrotic Amount: None Present (0%) Fat Layer (Subcutaneous Tissue) Exposed: Yes Tendon Exposed: No Muscle Exposed: No Joint Exposed: No Bone Exposed: No Treatment Notes Wound #1 (Lower Leg) Wound Laterality: Left, Posterior Cleanser Soap and Water Discharge Instruction: May shower and wash wound with dial antibacterial soap and water prior to dressing change. Peri-Wound Care Skin Prep Discharge Instruction: Use skin prep as directed Topical Primary Dressing MediHoney Gel, tube 1.5 (oz) Discharge Instruction: Apply to wound bed as instructed Secondary Dressing ALLEVYN Gentle Border, 3x3 (in/in) Discharge Instruction: Apply over primary dressing as directed. Secured With Compression Wrap Compression Stockings Facilities manager) Signed: 12/14/2021 3:50:29 PM By: Antonieta Iba Entered By: Antonieta Iba on 12/14/2021 13:05:18 -------------------------------------------------------------------------------- Wound Assessment Details Patient Name: Date of Service: [name], [name] 12/14/2021 12:30 PM Medical Record Number: [id number] Patient Account Number: [id number] Date of Birth/Sex: Treating RN: May 03, 1946 (76 y.o. [name] Primary Care Jerrin Recore: Georgann Housekeeper Other Clinician: Referring  Dolan Xia: Treating Lemoyne Scarpati/Extender: Orvan Seen Weeks in Treatment: 13 Wound Status Wound Number: 3 Primary Lymphedema Etiology: Wound Location: Left, Lateral, Posterior Upper Leg Wound Open Wounding Event: Gradually Appeared Status: Date Acquired: 12/11/2021 Comorbid Cataracts, Anemia, Lymphedema, Hypertension, Type II Weeks Of Treatment: 0 History: Diabetes, Osteoarthritis Clustered Wound: No Wound Measurements Length: (cm) 0.2 Width: (cm) 0.2 Depth: (cm) 0.1 Area: (cm) 0.031 Volume: (cm) 0.003 % Reduction in Area: % Reduction in Volume: Epithelialization: None Tunneling: No Undermining: No Wound Description Classification: Full Thickness Without Exposed Support Structures Wound Margin: Distinct, outline attached Exudate Amount: Medium Exudate Type: Serosanguineous Exudate Color: red, brown Foul Odor After Cleansing: No Slough/Fibrino No Wound Bed Granulation Amount: Large (67-100%) Exposed Structure Granulation Quality: Red Fascia Exposed: No Necrotic Amount: None Present (0%) Fat Layer (Subcutaneous Tissue) Exposed: Yes Tendon Exposed: No Muscle Exposed: No Joint Exposed: No Bone Exposed: No Treatment Notes Wound #3 (Upper Leg) Wound Laterality: Left, Lateral, Posterior Cleanser Soap and Water Discharge Instruction: May shower and wash wound with dial antibacterial soap and water prior to dressing change. Peri-Wound Care Skin Prep Discharge Instruction: Use skin prep as directed Topical Primary Dressing MediHoney Gel, tube 1.5 (oz) Discharge Instruction: Apply to wound bed as instructed Secondary Dressing ALLEVYN Gentle Border, 3x3 (in/in) Discharge Instruction: Apply over primary dressing as directed. Secured With Compression Wrap Compression Stockings Facilities manager) Signed: 12/14/2021 3:50:29 PM By: Antonieta Iba Entered By: Antonieta Iba on 12/14/2021  13:04:35 -------------------------------------------------------------------------------- Vitals Details Patient Name: Date of Service: [name], [name] 12/14/2021 12:30 PM Medical Record Number: [id number] Patient Account Number: [id number] Date of Birth/Sex: Treating RN: 1946/03/10 (75 y.o. [name] Primary Care Yury Schaus: Georgann Housekeeper Other Clinician: Referring Azani Brogdon: Treating Rahkeem Senft/Extender: Adline Mango in Treatment: 13 Vital Signs Time Taken: 12:43 Temperature (F): 98.5 Height (in): 58 Pulse (bpm): 105 Respiratory Rate (breaths/min): 18 Blood Pressure (mmHg): 144/86  Reference Range: 80 - 120 mg / dl Electronic Signature(s) Signed: 12/14/2021 3:50:29 PM By: Lorrin Jackson Entered By: Lorrin Jackson on 12/14/2021 12:45:22

## 2021-12-18 DIAGNOSIS — I890 Lymphedema, not elsewhere classified: Secondary | ICD-10-CM | POA: Diagnosis not present

## 2021-12-18 DIAGNOSIS — L97822 Non-pressure chronic ulcer of other part of left lower leg with fat layer exposed: Secondary | ICD-10-CM | POA: Diagnosis not present

## 2021-12-18 DIAGNOSIS — S71109A Unspecified open wound, unspecified thigh, initial encounter: Secondary | ICD-10-CM | POA: Diagnosis not present

## 2021-12-18 DIAGNOSIS — S81802A Unspecified open wound, left lower leg, initial encounter: Secondary | ICD-10-CM | POA: Diagnosis not present

## 2021-12-26 NOTE — Progress Notes (Signed)
Laurie, Hunter ([id number]) Visit Report for 11/30/2021 Arrival Information Details Patient Name: Date of Service: Laurie A NS, [name] 11/30/2021 2:45 PM Medical Record Number: [id number] Patient Account Number: [id number] Date of Birth/Sex: Treating RN: [birth year] (76 y.o. Laurie Hunter Primary Care Laurie Hunter: Georgann Housekeeper Other Clinician: Referring Laurie Hunter: Treating Laurie Hunter/Extender: Adline Mango in Treatment: 11 Visit Information History Since Last Visit Added or deleted any medications: Yes Patient Arrived: Wheel Chair Any new allergies or adverse reactions: No Arrival Time: 15:06 Had a fall or experienced change in No Transfer Assistance: Manual activities of daily living that may affect Patient Requires Transmission-Based Precautions: No risk of falls: Patient Has Alerts: Yes Signs or symptoms of abuse/neglect since last visito No Patient Alerts: Patient on Blood Thinner Hospitalized since last visit: No Implantable device outside of the clinic excluding No cellular tissue based products placed in the center since last visit: Has Dressing in Place as Prescribed: Yes Pain Present Now: No Electronic Signature(s) Signed: 12/26/2021 8:46:54 AM By: Thayer Dallas Entered By: Thayer Dallas on 11/30/2021 15:08:35 -------------------------------------------------------------------------------- Clinic Level of Care Assessment Details Patient Name: Date of Service: Laurie A NS, [name] 11/30/2021 2:45 PM Medical Record Number: [id number] Patient Account Number: [id number] Date of Birth/Sex: Treating RN: 01/23/1946 (76 y.o. Laurie Hunter Primary Care Laurie Hunter: Georgann Housekeeper Other Clinician: Referring Laurie Hunter: Treating Laurie Hunter/Extender: Adline Mango in Treatment: 11 Clinic Level of Care Assessment Items TOOL 4 Quantity Score X- 1 0 Use when only an EandM is performed on FOLLOW-UP visit ASSESSMENTS - Nursing  Assessment / Reassessment X- 1 10 Reassessment of Co-morbidities (includes updates in patient status) X- 1 5 Reassessment of Adherence to Treatment Plan ASSESSMENTS - Wound and Skin A ssessment / Reassessment X - Simple Wound Assessment / Reassessment - one wound 1 5 []  - 0 Complex Wound Assessment / Reassessment - multiple wounds []  - 0 Dermatologic / Skin Assessment (not related to wound area) ASSESSMENTS - Focused Assessment []  - 0 Circumferential Edema Measurements - multi extremities []  - 0 Nutritional Assessment / Counseling / Intervention []  - 0 Lower Extremity Assessment (monofilament, tuning fork, pulses) []  - 0 Peripheral Arterial Disease Assessment (using hand held doppler) ASSESSMENTS - Ostomy and/or Continence Assessment and Care []  - 0 Incontinence Assessment and Management []  - 0 Ostomy Care Assessment and Management (repouching, etc.) PROCESS - Coordination of Care []  - 0 Simple Patient / Family Education for ongoing care X- 1 20 Complex (extensive) Patient / Family Education for ongoing care X- 1 10 Staff obtains , Records, T Results / Process Orders est []  - 0 Staff telephones HHA, Nursing Homes / Clarify orders / etc []  - 0 Routine Transfer to another Facility (non-emergent condition) []  - 0 Routine Hospital Admission (non-emergent condition) []  - 0 New Admissions / / Ordering NPWT Apligraf, etc. , []  - 0 Emergency Hospital Admission (emergent condition) []  - 0 Simple Discharge Coordination []  - 0 Complex (extensive) Discharge Coordination PROCESS - Special Needs []  - 0 Pediatric / Minor Patient Management []  - 0 Isolation Patient Management []  - 0 Hearing / Language / Visual special needs []  - 0 Assessment of Community assistance (transportation, D/C planning, etc.) []  - 0 Additional assistance / Altered mentation []  - 0 Support Surface(s) Assessment (bed, cushion, seat, etc.) INTERVENTIONS - Wound  Cleansing / Measurement X - Simple Wound Cleansing - one wound 1 5 []  - 0 Complex Wound Cleansing - multiple wounds X- 1 5 Wound  Imaging (photographs - any number of wounds)  - 0 Wound Tracing (instead of photographs) X- 1 5 Simple Wound Measurement - one wound  - 0 Complex Wound Measurement - multiple wounds INTERVENTIONS - Wound Dressings X - Small Wound Dressing one or multiple wounds 1 10  - 0 Medium Wound Dressing one or multiple wounds  - 0 Large Wound Dressing one or multiple wounds  - 0 Application of Medications - topical  - 0 Application of Medications - injection INTERVENTIONS - Miscellaneous  - 0 External ear exam  - 0 Specimen Collection (cultures, biopsies, blood, body fluids, etc.)  - 0 Specimen(s) / Culture(s) sent or taken to Lab for analysis  - 0 Patient Transfer (multiple staff / Nurse, adult / Similar devices)  - 0 Simple Staple / Suture removal (25 or less)  - 0 Complex Staple / Suture removal (26 or more)  - 0 Hypo / Hyperglycemic Management (close monitor of Blood Glucose)  - 0 Ankle / Brachial Index (ABI) - do not check if billed separately X- 1 5 Vital Signs Has the patient been seen at the hospital within the last three years: Yes Total Score: 80 Level Of Care: New/Established - Level 3 Electronic Signature(s) Signed: 11/30/2021 6:05:14 PM By: Antonieta Iba Entered By: Antonieta Iba on 11/30/2021 15:29:47 -------------------------------------------------------------------------------- Encounter Discharge Information Details Patient Name: Date of Service: Laurie A NS, [name] 11/30/2021 2:45 PM Medical Record Number: [id number] Patient Account Number: [id number] Date of Birth/Sex: Treating RN: June 10, 1946 (76 y.o. Laurie Hunter Primary Care Laurie Hunter: Georgann Housekeeper Other Clinician: Referring Talya Quain: Treating Laurie Hunter/Extender: Adline Mango in Treatment: 11 Encounter Discharge  Information Items Discharge Condition: Stable Ambulatory Status: Wheelchair Discharge Destination: Home Transportation: Private Auto Accompanied By: friend Schedule Follow-up Appointment: Yes Clinical Summary of Care: Provided on 11/30/2021 Form Type Recipient Paper Patient Patient Electronic Signature(s) Signed: 11/30/2021 6:05:14 PM By: Antonieta Iba Entered By: Antonieta Iba on 11/30/2021 15:38:55 -------------------------------------------------------------------------------- Lower Extremity Assessment Details Patient Name: Date of Service: Laurie A NS, [name] 11/30/2021 2:45 PM Medical Record Number: [id number] Patient Account Number: [id number] Date of Birth/Sex: Treating RN: 1946-04-01 (76 y.o. Laurie Hunter Primary Care Wyland Rastetter: Georgann Housekeeper Other Clinician: Referring Morgen Linebaugh: Treating Dmarion Perfect/Extender: Orvan Seen Weeks in Treatment: 11 Electronic Signature(s) Signed: 11/30/2021 6:05:14 PM By: Antonieta Iba Signed: 12/26/2021 8:46:54 AM By: Thayer Dallas Entered By: Thayer Dallas on 11/30/2021 15:09:28 -------------------------------------------------------------------------------- Multi Wound Chart Details Patient Name: Date of Service: Laurie A NS, [name] 11/30/2021 2:45 PM Medical Record Number: [id number] Patient Account Number: [id number] Date of Birth/Sex: Treating RN: 12/04/1945 (76 y.o. Laurie Hunter Primary Care Maryln Eastham: Georgann Housekeeper Other Clinician: Referring Billyjoe Go: Treating Momoka Stringfield/Extender: Orvan Seen Weeks in Treatment: 11 Vital Signs Height(in): 58 Pulse(bpm): 106 Weight(lbs): Blood Pressure(mmHg): 152/88 Body Mass Index(BMI): Temperature(F): 98.5 Respiratory Rate(breaths/min): 20 Photos: [N/A:N/A] Left, Posterior Lower Leg N/A N/A Wound Location: Shear/Friction N/A N/A Wounding Event: Venous Leg Ulcer N/A N/A Primary Etiology: Cataracts, Anemia, Lymphedema, N/A N/A Comorbid  History: Hypertension, Type II Diabetes, Osteoarthritis 08/16/2020 N/A N/A Date Acquired: 11 N/A N/A Weeks of Treatment: Open N/A N/A Wound Status: No N/A N/A Wound Recurrence: 0.4x0.7x0.1 N/A N/A Measurements L x W x D (cm) 0.22 N/A N/A A (cm) : rea 0.022 N/A N/A Volume (cm) : 97.50% N/A N/A % Reduction in Area: 97.50% N/A N/A % Reduction in Volume: Full Thickness Without Exposed N/A N/A Classification: Support Structures Medium N/A N/A Exudate Amount: Serosanguineous N/A N/A Exudate  Type: red, brown N/A N/A Exudate Color: Distinct, outline attached N/A N/A Wound Margin: Large (67-100%) N/A N/A Granulation Amount: Red N/A N/A Granulation Quality: None Present (0%) N/A N/A Necrotic Amount: Fat Layer (Subcutaneous Tissue): Yes N/A N/A Exposed Structures: Fascia: No Tendon: No Muscle: No Joint: No Bone: No Large (67-100%) N/A N/A Epithelialization: Treatment Notes Electronic Signature(s) Signed: 11/30/2021 4:04:51 PM By: Geralyn CorwinHoffman, Jessica DO Signed: 11/30/2021 6:05:14 PM By: Antonieta IbaBarnhart, Jodi Entered By: Geralyn CorwinHoffman, Jessica on 11/30/2021 15:30:51 -------------------------------------------------------------------------------- Multi-Disciplinary Care Plan Details Patient Name: Date of Service: Laurie A NS, [name] 11/30/2021 2:45 PM Medical Record Number: [id number] Patient Account Number: [id number] Date of Birth/Sex: Treating RN: [birth year] (76 y.o. Laurie CluckF) Barnhart, Jodi Primary Care Randilyn Foisy: Georgann HousekeeperHusain, Karrar Other Clinician: Referring Chaye Misch: Treating Zamier Eggebrecht/Extender: Adline MangoHoffman, Jessica Husain, Karrar Weeks in Treatment: 11 Active Inactive Wound/Skin Impairment Nursing Diagnoses: Knowledge deficit related to ulceration/compromised skin integrity Goals: Patient/caregiver will verbalize understanding of skin care regimen Date Initiated: 09/12/2021 Target Resolution Date: 12/14/2021 Goal Status: Active Interventions: Assess patient/caregiver ability to obtain  necessary supplies Assess patient/caregiver ability to perform ulcer/skin care regimen upon admission and as needed Provide education on ulcer and skin care Treatment Activities: Skin care regimen initiated : 09/12/2021 Topical wound management initiated : 09/12/2021 Notes: 10/02/21: Wound care regimen continues Electronic Signature(s) Signed: 11/30/2021 6:05:14 PM By: Antonieta IbaBarnhart, Jodi Entered By: Antonieta IbaBarnhart, Jodi on 11/30/2021 15:15:45 -------------------------------------------------------------------------------- Pain Assessment Details Patient Name: Date of Service: Laurie A NS, [name] 11/30/2021 2:45 PM Medical Record Number: [id number] Patient Account Number: [id number] Date of Birth/Sex: Treating RN: [birth year] (76 y.o. Laurie CluckF) Barnhart, Jodi Primary Care Halo Laski: Georgann HousekeeperHusain, Karrar Other Clinician: Referring Gatlyn Lipari: Treating Dilan Fullenwider/Extender: Adline MangoHoffman, Jessica Husain, Karrar Weeks in Treatment: 11 Active Problems Location of Pain Severity and Description of Pain Patient Has Paino No Site Locations Pain Management and Medication Current Pain Management: Electronic Signature(s) Signed: 11/30/2021 6:05:14 PM By: Antonieta IbaBarnhart, Jodi Signed: 12/26/2021 8:46:54 AM By: Thayer Dallasick, Kimberly Entered By: Thayer Dallasick, Kimberly on 11/30/2021 15:09:16 -------------------------------------------------------------------------------- Patient/Caregiver Education Details Patient Name: Date of Service: Laurie A NS, [name] 5/18/2023andnbsp2:45 PM Medical Record Number: [id number] Patient Account Number: [id number] Date of Birth/Gender: Treating RN: [birth year] (76 y.o. Laurie CluckF) Barnhart, Jodi Primary Care Physician: Georgann HousekeeperHusain, Karrar Other Clinician: Referring Physician: Treating Physician/Extender: Adline MangoHoffman, Jessica Husain, Karrar Weeks in Treatment: 11 Education Assessment Education Provided To: Patient Education Topics Provided Wound/Skin Impairment: Methods: Explain/Verbal, Printed Responses: State content  correctly Nash-Finch CompanyElectronic Signature(s) Signed: 11/30/2021 6:05:14 PM By: Antonieta IbaBarnhart, Jodi Entered By: Antonieta IbaBarnhart, Jodi on 11/30/2021 15:16:08 -------------------------------------------------------------------------------- Wound Assessment Details Patient Name: Date of Service: Laurie A NS, [name] 11/30/2021 2:45 PM Medical Record Number: [id number] Patient Account Number: [id number] Date of Birth/Sex: Treating RN: [birth year] (76 y.o. Laurie CluckF) Barnhart, Jodi Primary Care Tashanda Fuhrer: Georgann HousekeeperHusain, Karrar Other Clinician: Referring Kalel Harty: Treating Britanny Marksberry/Extender: Orvan SeenHoffman, Jessica Husain, Karrar Weeks in Treatment: 11 Wound Status Wound Number: 1 Primary Venous Leg Ulcer Etiology: Wound Location: Left, Posterior Lower Leg Wound Open Wounding Event: Shear/Friction Status: Date Acquired: 08/16/2020 Comorbid Cataracts, Anemia, Lymphedema, Hypertension, Type II Weeks Of Treatment: 11 History: Diabetes, Osteoarthritis Clustered Wound: No Photos Wound Measurements Length: (cm) 0.4 Width: (cm) 0.7 Depth: (cm) 0.1 Area: (cm) 0.22 Volume: (cm) 0.022 % Reduction in Area: 97.5% % Reduction in Volume: 97.5% Epithelialization: Large (67-100%) Tunneling: No Undermining: No Wound Description Classification: Full Thickness Without Exposed Support Structures Wound Margin: Distinct, outline attached Exudate Amount: Medium Exudate Type: Serosanguineous Exudate Color: red, brown Foul Odor After Cleansing: No Slough/Fibrino No Wound Bed Granulation Amount: Large (67-100%) Exposed Structure  Granulation Quality: Red Fascia Exposed: No Necrotic Amount: None Present (0%) Fat Layer (Subcutaneous Tissue) Exposed: Yes Tendon Exposed: No Muscle Exposed: No Joint Exposed: No Bone Exposed: No Electronic Signature(s) Signed: 11/30/2021 6:05:14 PM By: Antonieta Iba Signed: 12/26/2021 8:46:54 AM By: Thayer Dallas Entered By: Thayer Dallas on 11/30/2021  15:17:01 -------------------------------------------------------------------------------- Vitals Details Patient Name: Date of Service: Laurie A NS, [name] 11/30/2021 2:45 PM Medical Record Number: [id number] Patient Account Number: [id number] Date of Birth/Sex: Treating RN: 20-Jul-1945 (76 y.o. Laurie Hunter Primary Care Koralee Wedeking: Georgann Housekeeper Other Clinician: Referring Callan Norden: Treating Philipe Laswell/Extender: Orvan Seen Weeks in Treatment: 11 Vital Signs Time Taken: 15:08 Temperature (F): 98.5 Height (in): 58 Pulse (bpm): 106 Respiratory Rate (breaths/min): 20 Blood Pressure (mmHg): 152/88 Reference Range: 80 - 120 mg / dl Electronic Signature(s) Signed: 12/26/2021 8:46:54 AM By: Thayer Dallas Entered By: Thayer Dallas on 11/30/2021 15:09:02

## 2021-12-28 ENCOUNTER — Encounter (HOSPITAL_BASED_OUTPATIENT_CLINIC_OR_DEPARTMENT_OTHER): Payer: Medicare Other | Admitting: Internal Medicine

## 2021-12-28 DIAGNOSIS — I69354 Hemiplegia and hemiparesis following cerebral infarction affecting left non-dominant side: Secondary | ICD-10-CM | POA: Diagnosis not present

## 2021-12-28 DIAGNOSIS — E1122 Type 2 diabetes mellitus with diabetic chronic kidney disease: Secondary | ICD-10-CM | POA: Diagnosis not present

## 2021-12-28 DIAGNOSIS — E1136 Type 2 diabetes mellitus with diabetic cataract: Secondary | ICD-10-CM | POA: Diagnosis not present

## 2021-12-28 DIAGNOSIS — I129 Hypertensive chronic kidney disease with stage 1 through stage 4 chronic kidney disease, or unspecified chronic kidney disease: Secondary | ICD-10-CM | POA: Diagnosis not present

## 2021-12-28 DIAGNOSIS — I890 Lymphedema, not elsewhere classified: Secondary | ICD-10-CM | POA: Diagnosis not present

## 2021-12-28 DIAGNOSIS — I639 Cerebral infarction, unspecified: Secondary | ICD-10-CM | POA: Diagnosis not present

## 2021-12-28 DIAGNOSIS — E11621 Type 2 diabetes mellitus with foot ulcer: Secondary | ICD-10-CM | POA: Diagnosis not present

## 2021-12-28 DIAGNOSIS — L97822 Non-pressure chronic ulcer of other part of left lower leg with fat layer exposed: Secondary | ICD-10-CM | POA: Diagnosis not present

## 2021-12-28 DIAGNOSIS — E11622 Type 2 diabetes mellitus with other skin ulcer: Secondary | ICD-10-CM | POA: Diagnosis not present

## 2021-12-28 DIAGNOSIS — M1990 Unspecified osteoarthritis, unspecified site: Secondary | ICD-10-CM | POA: Diagnosis not present

## 2021-12-28 DIAGNOSIS — N183 Chronic kidney disease, stage 3 unspecified: Secondary | ICD-10-CM | POA: Diagnosis not present

## 2021-12-28 DIAGNOSIS — E785 Hyperlipidemia, unspecified: Secondary | ICD-10-CM | POA: Diagnosis not present

## 2021-12-28 NOTE — Progress Notes (Signed)
Laurie, Hunter ([id number]) Visit Report for 12/28/2021 Arrival Information Details Patient Name: Date of Service: [name], [name] 12/28/2021 2:45 PM Medical Record Number: [id number] Patient Account Number: [id number] Date of Birth/Sex: Treating RN: [birth year] (76 y.o. Laurie Hunter Primary Care Laurie Hunter: Laurie Hunter Other Clinician: Referring Laurie Hunter: Treating Laurie Hunter/Extender: Laurie Hunter in Treatment: 15 Visit Information History Since Last Visit Added or deleted any medications: No Patient Arrived: Wheel Chair Any new allergies or adverse reactions: No Arrival Time: 14:53 Had a fall or experienced change in No Accompanied By: friend activities of daily living that may affect Transfer Assistance: Manual risk of falls: Patient Requires Transmission-Based Precautions: No Signs or symptoms of abuse/neglect since last visito No Patient Has Alerts: Yes Hospitalized since last visit: No Patient Alerts: Patient on Blood Thinner Implantable device outside of the clinic excluding No cellular tissue based products placed in the center since last visit: Has Dressing in Place as Prescribed: Yes Pain Present Now: No Electronic Signature(s) Signed: 12/28/2021 5:26:14 PM By: Antonieta Iba Entered By: Antonieta Iba on 12/28/2021 14:55:22 -------------------------------------------------------------------------------- Clinic Level of Care Assessment Details Patient Name: Date of Service: [name], [name] 12/28/2021 2:45 PM Medical Record Number: [id number] Patient Account Number: [id number] Date of Birth/Sex: Treating RN: Laurie Hunter (76 y.o. Laurie Hunter Primary Care Chuong Casebeer: Laurie Hunter Other Clinician: Referring Maud Rubendall: Treating Dariela Stoker/Extender: Laurie Hunter in Treatment: 15 Clinic Level of Care Assessment Items TOOL 4 Quantity Score X- 1 0 Use when only an EandM is performed on FOLLOW-UP  visit ASSESSMENTS - Nursing Assessment / Reassessment X- 1 10 Reassessment of Co-morbidities (includes updates in patient status) X- 1 5 Reassessment of Adherence to Treatment Plan ASSESSMENTS - Wound and Skin A ssessment / Reassessment []  - 0 Simple Wound Assessment / Reassessment - one wound X- 2 5 Complex Wound Assessment / Reassessment - multiple wounds []  - 0 Dermatologic / Skin Assessment (not related to wound area) ASSESSMENTS - Focused Assessment []  - 0 Circumferential Edema Measurements - multi extremities []  - 0 Nutritional Assessment / Counseling / Intervention []  - 0 Lower Extremity Assessment (monofilament, tuning fork, pulses) []  - 0 Peripheral Arterial Disease Assessment (using hand held doppler) ASSESSMENTS - Ostomy and/or Continence Assessment and Care []  - 0 Incontinence Assessment and Management []  - 0 Ostomy Care Assessment and Management (repouching, etc.) PROCESS - Coordination of Care []  - 0 Simple Patient / Family Education for ongoing care X- 1 20 Complex (extensive) Patient / Family Education for ongoing care []  - 0 Staff obtains , Records, T Results / Process Orders est []  - 0 Staff telephones HHA, Nursing Homes / Clarify orders / etc []  - 0 Routine Transfer to another Facility (non-emergent condition) []  - 0 Routine Hospital Admission (non-emergent condition) []  - 0 New Admissions / / Ordering NPWT Apligraf, etc. , []  - 0 Emergency Hospital Admission (emergent condition) []  - 0 Simple Discharge Coordination []  - 0 Complex (extensive) Discharge Coordination PROCESS - Special Needs []  - 0 Pediatric / Minor Patient Management []  - 0 Isolation Patient Management []  - 0 Hearing / Language / Visual special needs []  - 0 Assessment of Community assistance (transportation, D/C planning, etc.) []  - 0 Additional assistance / Altered mentation []  - 0 Support Surface(s) Assessment (bed, cushion, seat,  etc.) INTERVENTIONS - Wound Cleansing / Measurement []  - 0 Simple Wound Cleansing - one wound X- 2 5 Complex Wound Cleansing - multiple wounds X- 1 5  Wound Imaging (photographs - any number of wounds) []  - 0 Wound Tracing (instead of photographs) []  - 0 Simple Wound Measurement - one wound X- 2 5 Complex Wound Measurement - multiple wounds INTERVENTIONS - Wound Dressings []  - 0 Small Wound Dressing one or multiple wounds X- 1 15 Medium Wound Dressing one or multiple wounds []  - 0 Large Wound Dressing one or multiple wounds []  - 0 Application of Medications - topical []  - 0 Application of Medications - injection INTERVENTIONS - Miscellaneous []  - 0 External ear exam []  - 0 Specimen Collection (cultures, biopsies, blood, body fluids, etc.) []  - 0 Specimen(s) / Culture(s) sent or taken to Lab for analysis []  - 0 Patient Transfer (multiple staff / / Similar devices) []  - 0 Simple Staple / Suture removal (25 or less) []  - 0 Complex Staple / Suture removal (26 or more) []  - 0 Hypo / Hyperglycemic Management (close monitor of Blood Glucose) []  - 0 Ankle / Brachial Index (ABI) - do not check if billed separately X- 1 5 Vital Signs Has the patient been seen at the hospital within the last three years: Yes Total Score: 90 Level Of Care: New/Established - Level 3 Electronic Signature(s) Signed: 12/28/2021 5:26:14 PM By: Entered By: on 12/28/2021 15:35:22 -------------------------------------------------------------------------------- Encounter Discharge Information Details Patient Name: Date of Service: [name], [name] 12/28/2021 2:45 PM Medical Record Number: Patient Account Number: Date of Birth/Sex: Treating RN: [birth year] Hunter (76 y.o. Nurse, adult Primary Care Dia Jefferys: Other Clinician: Referring Roshad Hack: Treating Shakima Nisley/Extender: in Treatment:  15 Encounter Discharge Information Items Discharge Condition: Stable Ambulatory Status: Wheelchair Discharge Destination: Home Transportation: Private Auto Accompanied By: friend Schedule Follow-up Appointment: Yes Clinical Summary of Care: Provided on 12/28/2021 Form Type Recipient Paper Patient Patient Electronic Signature(s) Signed: 12/28/2021 5:26:14 PM By: 12/30/2021 Entered By: Antonieta Iba on 12/28/2021 16:51:33 -------------------------------------------------------------------------------- Lower Extremity Assessment Details Patient Name: Date of Service: [name], [name] 12/28/2021 2:45 PM Medical Record Number: 12/30/2021 Patient Account Number: [id number] Date of Birth/Sex: Treating RN: [birth year] (76 y.o. 73 Primary Care Haroldine Redler: Laurie Hunter Other Clinician: Referring Sue Fernicola: Treating Isay Perleberg/Extender: Laurie Hunter Weeks in Treatment: 15 Electronic Signature(s) Signed: 12/28/2021 5:26:14 PM By: 12/30/2021 Entered By: 12/30/2021 on 12/28/2021 14:59:19 -------------------------------------------------------------------------------- Multi Wound Chart Details Patient Name: Date of Service: [name], [name] 12/28/2021 2:45 PM Medical Record Number: 12/30/2021 Patient Account Number: 12/30/2021 Date of Birth/Sex: Treating RN: [birth year] (76 y.o. [birth year]/Hunter Primary Care Macalister Arnaud: 73 Other Clinician: Referring Kasiya Burck: Treating Saidee Geremia/Extender: Laurie Hunter Weeks in Treatment: 15 Vital Signs Height(in): 58 Pulse(bpm): 100 Weight(lbs): Blood Pressure(mmHg): 129/69 Body Mass Index(BMI): Temperature(F): 98.5 Respiratory Rate(breaths/min): 20 Photos: [N/A:N/A] Left, Posterior Lower Leg Left, Lateral, Posterior Upper Leg N/A Wound Location: Shear/Friction Gradually Appeared N/A Wounding Event: Venous Leg Ulcer Lymphedema N/A Primary Etiology: Cataracts, Anemia,  Lymphedema, Cataracts, Anemia, Lymphedema, N/A Comorbid History: Hypertension, Type II Diabetes, Hypertension, Type II Diabetes, Osteoarthritis Osteoarthritis 08/16/2020 12/11/2021 N/A Date Acquired: 15 2 N/A Weeks of Treatment: Open Open N/A Wound Status: No No N/A Wound Recurrence: 0.5x0.8x0.1 0.2x0.2x0.1 N/A Measurements L x W x D (cm) 0.314 0.031 N/A A (cm) : rea 0.031 0.003 N/A Volume (cm) : 96.40% 0.00% N/A % Reduction in Area: 96.50% 0.00% N/A % Reduction in Volume: Full Thickness Without Exposed Full Thickness Without Exposed N/A Classification: Support Structures Support  Structures Medium Medium N/A Exudate Amount: Serosanguineous Serosanguineous N/A Exudate Type: red, brown red, brown N/A Exudate Color: Distinct, outline attached Distinct, outline attached N/A Wound Margin: Large (67-100%) Large (67-100%) N/A Granulation Amount: Red Red N/A Granulation Quality: None Present (0%) None Present (0%) N/A Necrotic Amount: Fat Layer (Subcutaneous Tissue): Yes Fat Layer (Subcutaneous Tissue): Yes N/A Exposed Structures: Fascia: No Fascia: No Tendon: No Tendon: No Muscle: No Muscle: No Joint: No Joint: No Bone: No Bone: No Large (67-100%) None N/A Epithelialization: Treatment Notes Electronic Signature(s) Signed: 12/28/2021 5:03:07 PM By: Geralyn Corwin DO Signed: 12/28/2021 5:26:14 PM By: Antonieta Iba Entered By: Geralyn Corwin on 12/28/2021 16:44:51 -------------------------------------------------------------------------------- Multi-Disciplinary Care Plan Details Patient Name: Date of Service: [name], [name] 12/28/2021 2:45 PM Medical Record Number: [id number] Patient Account Number: [id number] Date of Birth/Sex: Treating RN: [birth year] (76 y.o. Laurie Hunter Primary Care Oniel Meleski: Laurie Hunter Other Clinician: Referring Raji Glinski: Treating Amaira Safley/Extender: Laurie Hunter in Treatment: 15 Active  Inactive Wound/Skin Impairment Nursing Diagnoses: Knowledge deficit related to ulceration/compromised skin integrity Goals: Patient/caregiver will verbalize understanding of skin care regimen Date Initiated: 09/12/2021 Target Resolution Date: 01/11/2022 Goal Status: Active Interventions: Assess patient/caregiver ability to obtain necessary supplies Assess patient/caregiver ability to perform ulcer/skin care regimen upon admission and as needed Provide education on ulcer and skin care Treatment Activities: Skin care regimen initiated : 09/12/2021 Topical wound management initiated : 09/12/2021 Notes: 10/02/21: Wound care regimen continues Electronic Signature(s) Signed: 12/28/2021 5:26:14 PM By: Antonieta Iba Entered By: Antonieta Iba on 12/28/2021 15:06:33 -------------------------------------------------------------------------------- Pain Assessment Details Patient Name: Date of Service: [name], [name] 12/28/2021 2:45 PM Medical Record Number: [id number] Patient Account Number: [id number] Date of Birth/Sex: Treating RN: [birth year] (76 y.o. Laurie Hunter Primary Care Shene Maxfield: Laurie Hunter Other Clinician: Referring Dayanis Bergquist: Treating Cerria Randhawa/Extender: Laurie Hunter in Treatment: 15 Active Problems Location of Pain Severity and Description of Pain Patient Has Paino No Site Locations Pain Management and Medication Current Pain Management: Electronic Signature(s) Signed: 12/28/2021 5:26:14 PM By: Antonieta Iba Entered By: Antonieta Iba on 12/28/2021 14:55:38 -------------------------------------------------------------------------------- Patient/Caregiver Education Details Patient Name: Date of Service: [name], [name] 6/15/2023andnbsp2:45 PM Medical Record Number: [id number] Patient Account Number: [id number] Date of Birth/Gender: Treating RN: [birth year] (76 y.o. Laurie Hunter Primary Care Physician: Laurie Hunter Other  Clinician: Referring Physician: Treating Physician/Extender: Laurie Hunter in Treatment: 15 Education Assessment Education Provided To: Patient Education Topics Provided Wound/Skin Impairment: Methods: Demonstration, Explain/Verbal, Printed Responses: State content correctly [employer]) Signed: 12/28/2021 5:26:14 PM By: Antonieta Iba Entered By: Antonieta Iba on 12/28/2021 15:06:48 -------------------------------------------------------------------------------- Wound Assessment Details Patient Name: Date of Service: [name], [name] 12/28/2021 2:45 PM Medical Record Number: [id number] Patient Account Number: [id number] Date of Birth/Sex: Treating RN: [birth year] (76 y.o. Laurie Hunter Primary Care Azavion Bouillon: Laurie Hunter Other Clinician: Referring Kalleigh [place]: Treating Cai Anfinson/Extender: Orvan Seen Weeks in Treatment: 15 Wound Status Wound Number: 1 Primary Venous Leg Ulcer Etiology: Wound Location: Left, Posterior Lower Leg Wound Open Wounding Event: Shear/Friction Status: Date Acquired: 08/16/2020 Comorbid Cataracts, Anemia, Lymphedema, Hypertension, Type II Weeks Of Treatment: 15 History: Diabetes, Osteoarthritis Clustered Wound: No Photos Wound Measurements Length: (cm) 0.5 Width: (cm) 0.8 Depth: (cm) 0.1 Area: (cm) 0.314 Volume: (cm) 0.031 % Reduction in Area: 96.4% % Reduction in Volume: 96.5% Epithelialization: Large (67-100%) Tunneling: No Undermining: No Wound Description Classification: Full Thickness Without Exposed Support Structures Wound Margin: Distinct, outline attached Exudate  Amount: Medium Exudate Type: Serosanguineous Exudate Color: red, brown Foul Odor After Cleansing: No Slough/Fibrino No Wound Bed Granulation Amount: Large (67-100%) Exposed Structure Granulation Quality: Red Fascia Exposed: No Necrotic Amount: None Present (0%) Fat Layer (Subcutaneous Tissue) Exposed:  Yes Tendon Exposed: No Muscle Exposed: No Joint Exposed: No Bone Exposed: No Treatment Notes Wound #1 (Lower Leg) Wound Laterality: Left, Posterior Cleanser Soap and Water Discharge Instruction: May shower and wash wound with dial antibacterial soap and water prior to dressing change. Peri-Wound Care Skin Prep Discharge Instruction: Use skin prep as directed Topical Primary Dressing MediHoney Gel, tube 1.5 (oz) Discharge Instruction: Apply to wound bed as instructed Secondary Dressing ALLEVYN Gentle Border, 3x3 (in/in) Discharge Instruction: Apply over primary dressing as directed. Secured With Compression Wrap Compression Stockings Facilities manager) Signed: 12/28/2021 5:26:14 PM By: Antonieta Iba Entered By: Antonieta Iba on 12/28/2021 15:05:06 -------------------------------------------------------------------------------- Wound Assessment Details Patient Name: Date of Service: [name], [name] 12/28/2021 2:45 PM Medical Record Number: [id number] Patient Account Number: [id number] Date of Birth/Sex: Treating RN: [birth year], Hunter (76 y.o. Laurie Hunter Primary Care Fatina Sprankle: Laurie Hunter Other Clinician: Referring Urbano Milhouse: Treating Decarlo Rivet/Extender: Orvan Seen Weeks in Treatment: 15 Wound Status Wound Number: 3 Primary Lymphedema Etiology: Wound Location: Left, Lateral, Posterior Upper Leg Wound Open Wounding Event: Gradually Appeared Status: Date Acquired: 12/11/2021 Comorbid Cataracts, Anemia, Lymphedema, Hypertension, Type II Weeks Of Treatment: 2 History: Diabetes, Osteoarthritis Clustered Wound: No Photos Wound Measurements Length: (cm) 0.2 Width: (cm) 0.2 Depth: (cm) 0.1 Area: (cm) 0.031 Volume: (cm) 0.003 % Reduction in Area: 0% % Reduction in Volume: 0% Epithelialization: None Tunneling: No Undermining: No Wound Description Classification: Full Thickness Without Exposed Support Structures Wound Margin:  Distinct, outline attached Exudate Amount: Medium Exudate Type: Serosanguineous Exudate Color: red, brown Foul Odor After Cleansing: No Slough/Fibrino No Wound Bed Granulation Amount: Large (67-100%) Exposed Structure Granulation Quality: Red Fascia Exposed: No Necrotic Amount: None Present (0%) Fat Layer (Subcutaneous Tissue) Exposed: Yes Tendon Exposed: No Muscle Exposed: No Joint Exposed: No Bone Exposed: No Treatment Notes Wound #3 (Upper Leg) Wound Laterality: Left, Lateral, Posterior Cleanser Soap and Water Discharge Instruction: May shower and wash wound with dial antibacterial soap and water prior to dressing change. Peri-Wound Care Skin Prep Discharge Instruction: Use skin prep as directed Topical Primary Dressing MediHoney Gel, tube 1.5 (oz) Discharge Instruction: Apply to wound bed as instructed Secondary Dressing ALLEVYN Gentle Border, 3x3 (in/in) Discharge Instruction: Apply over primary dressing as directed. Secured With Compression Wrap Compression Stockings Facilities manager) Signed: 12/28/2021 5:26:14 PM By: Antonieta Iba Entered By: Antonieta Iba on 12/28/2021 15:05:52 -------------------------------------------------------------------------------- Vitals Details Patient Name: Date of Service: [name], [name] 12/28/2021 2:45 PM Medical Record Number: [id number] Patient Account Number: [id number] Date of Birth/Sex: Treating RN: [birth year] (76 y.o. Laurie Hunter Primary Care Marsela Kuan: Laurie Hunter Other Clinician: Referring Dhyana Bastone: Treating Eliceo Gladu/Extender: Orvan Seen Weeks in Treatment: 15 Vital Signs Time Taken: 14:57 Temperature (F): 98.5 Height (in): 58 Pulse (bpm): 100 Respiratory Rate (breaths/min): 20 Blood Pressure (mmHg): 129/69 Reference Range: 80 - 120 mg / dl Electronic Signature(s) Signed: 12/28/2021 5:26:14 PM By: Antonieta Iba Entered By: Antonieta Iba on 12/28/2021  14:58:43

## 2021-12-28 NOTE — Progress Notes (Signed)
NEVIN, VEREB ([id number]) Visit Report for 12/28/2021 Chief Complaint Document Details Patient Name: Date of Service: [name], [name] 12/28/2021 2:45 PM Medical Record Number: [id number] Patient Account Number: [id number] Date of Birth/Sex: Treating RN: [birth year] (76 y.o. [name] Primary Care Provider: [name] Other Clinician: Referring Provider: Treating Provider/Extender: [name] in Treatment: 15 Information Obtained from: Patient Chief Complaint Left lower extremity wound Electronic Signature(s) Signed: 12/28/2021 5:03:07 PM By: Kalman Shan DO Entered By: Kalman Shan on 12/28/2021 16:45:16 -------------------------------------------------------------------------------- HPI Details Patient Name: Date of Service: [name], [name] 12/28/2021 2:45 PM Medical Record Number: [id number] Patient Account Number: [id number] Date of Birth/Sex: Treating RN: 10/06/1945 (76 y.o. [name] Primary Care Provider: [name] Other Clinician: Referring Provider: Treating Provider/Extender: Rhetta Mura Weeks in Treatment: 15 History of Present Illness HPI Description: Admission 09/12/2021 Ms. Graylin Jalloh is a 76 year old female with a past medical history of type 2 diabetes on oral agents, CVA with left-sided weakness that presents to the clinic for a 1 to 34-month history of wound to her left posterior leg. She states that this started from friction from the mattress she lays on in the chair she sits in. She is not accommodated to help relieve the pressure. She has been followed by her primary care physician for this issue and been treated with 2 rounds of doxycycline. She reports improvement in drainage after taking antibiotics. She currently denies signs of infection. She currently keeps the area covered. 3/8; patient presents for follow-up. She has been using Hydrofera Blue with no issues. She denies  signs of infection. 3/20; patient presents for follow-up. She continues to use [facility] with no issues. She has no questions or concerns today. 4/3; patient presents for follow-up. She has been using Hydrofera Blue with no issues. She denies signs of infection. 4/24; patient presents for follow-up. She been using Hydrofera Blue and antibiotic ointment to the wound beds up until a few days ago when she thought that the wounds had closed. She currently denies any drainage from the previous wound sites. She denies signs of infection. 5/18; patient presents for follow-up. She states she used Hydrofera Blue for a week to the wound bed. She states she has not been doing any dressing changes for the past 1 to 2 weeks. It is unclear why. She currently denies signs of infection. 6/1; patient presents for follow-up. She has been using collagen to the wound bed twice weekly. There is no collagen noted today on intake. She denies signs of infection. She states that a new wound formed adjacent to the previous wound. 6/15; patient presents for follow-up. She States she has been using Medihoney to the wound bed. Unfortunately when looking at the dressing it looks like she puts the Medihoney on the foam pad and then covers this up with a skin prep cloth. It does not appear that she is actually putting Medihoney directly on the wound bed. She denies signs of infection. Electronic Signature(s) Signed: 12/28/2021 5:03:07 PM By: Kalman Shan DO Entered By: Kalman Shan on 12/28/2021 16:47:45 -------------------------------------------------------------------------------- Physical Exam Details Patient Name: Date of Service: [name], [name] 12/28/2021 2:45 PM Medical Record Number: [id number] Patient Account Number: [id number] Date of Birth/Sex: Treating RN: 1946/04/22 (76 y.o. [name] Primary Care Provider: [name] Other Clinician: Referring Provider: Treating Provider/Extender:  Rhetta Mura Weeks in Treatment: 15 Constitutional respirations regular, non-labored and within target range  for patient.Marland Kitchen Psychiatric pleasant and cooperative. Notes Left lower extremity: T the posterior thigh located to a significant fat/lymphedema fold there are 2 open wounds with granulation tissue. No signs of o surrounding infection. Electronic Signature(s) Signed: 12/28/2021 5:03:07 PM By: Geralyn Corwin DO Entered By: Geralyn Corwin on 12/28/2021 16:48:25 -------------------------------------------------------------------------------- Physician Orders Details Patient Name: Date of Service: [name], [name] 12/28/2021 2:45 PM Medical Record Number: [id number] Patient Account Number: [id number] Date of Birth/Sex: Treating RN: [birth year] (76 y.o. [name] Primary Care Provider: Georgann Housekeeper Other Clinician: Referring Provider: Treating Provider/Extender: Adline Mango in Treatment: 15 Verbal / Phone Orders: No Diagnosis Coding Follow-up Appointments ppointment in 2 weeks. - 01/11/22 @ 2:45pm with Dr. Mikey Bussing [facility] Laity, Room 7) Return A Bathing/ Shower/ Hygiene May shower and wash wound with soap and water. Off-Loading Other: - ensure no pressure or friction/shear to area to aid in wound healing. Wound Treatment Wound #1 - Lower Leg Wound Laterality: Left, Posterior Cleanser: Soap and Water 1 x Per Day/30 Days Discharge Instructions: May shower and wash wound with dial antibacterial soap and water prior to dressing change. Peri-Wound Care: Skin Prep (Generic) 1 x Per Day/30 Days Discharge Instructions: Use skin prep as directed Prim Dressing: MediHoney Gel, tube 1.5 (oz) 1 x Per Day/30 Days ary Discharge Instructions: Apply to wound bed as instructed Secondary Dressing: ALLEVYN Gentle Border, 3x3 (in/in) (Generic) 1 x Per Day/30 Days Discharge Instructions: Apply over primary dressing as directed. Wound #3 - Upper  Leg Wound Laterality: Left, Lateral, Posterior Cleanser: Soap and Water 1 x Per Day/30 Days Discharge Instructions: May shower and wash wound with dial antibacterial soap and water prior to dressing change. Peri-Wound Care: Skin Prep (Generic) 1 x Per Day/30 Days Discharge Instructions: Use skin prep as directed Prim Dressing: MediHoney Gel, tube 1.5 (oz) 1 x Per Day/30 Days ary Discharge Instructions: Apply to wound bed as instructed Secondary Dressing: ALLEVYN Gentle Border, 3x3 (in/in) (Generic) 1 x Per Day/30 Days Discharge Instructions: Apply over primary dressing as directed. Electronic Signature(s) Signed: 12/28/2021 5:03:07 PM By: Geralyn Corwin DO Entered By: Geralyn Corwin on 12/28/2021 17:00:43 -------------------------------------------------------------------------------- Problem List Details Patient Name: Date of Service: [name], [name] 12/28/2021 2:45 PM Medical Record Number: [id number] Patient Account Number: [id number] Date of Birth/Sex: Treating RN: Oct 11, 1945 (76 y.o. 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 12/28/2021 2:45 PM Medical  Record Number: [id number] Patient Account Number: [id number] Date of Birth/Sex: Treating RN: 1946/05/24 (76 y.o. [name] Primary Care Provider: Georgann Housekeeper Other Clinician: Referring Provider: Treating Provider/Extender: Adline Mango in Treatment: 15 Subjective Chief Complaint Information obtained from Patient Left lower extremity wound History of Present Illness (HPI) Admission 09/12/2021 Ms. Venera Privott is a 76 year old female with a past medical history of type 2 diabetes on oral agents, CVA with left-sided weakness that presents to  the clinic for a 1 to 89-month history of wound to her left posterior leg. She states that this started from friction from the mattress she lays on in the chair she sits in. She is not accommodated to help relieve the pressure. She has been followed by her primary care physician for this issue and been treated with 2 rounds of doxycycline. She reports improvement in drainage after taking antibiotics. She currently denies signs of infection. She currently keeps the area covered. 3/8; patient presents for follow-up. She has been using Hydrofera Blue with no issues. She denies signs of infection. 3/20; patient presents for follow-up. She continues to use [facility] with no issues. She has no questions or concerns today. 4/3; patient presents for follow-up. She has been using Hydrofera Blue with no issues. She denies signs of infection. 4/24; patient presents for follow-up. She been using Hydrofera Blue and antibiotic ointment to the wound beds up until a few days ago when she thought that the wounds had closed. She currently denies any drainage from the previous wound sites. She denies signs of infection. 5/18; patient presents for follow-up. She states she used Hydrofera Blue for a week to the wound bed. She states she has not been doing any dressing changes for the past 1 to 2 weeks. It is unclear why. She currently denies  signs of infection. 6/1; patient presents for follow-up. She has been using collagen to the wound bed twice weekly. There is no collagen noted today on intake. She denies signs of infection. She states that a new wound formed adjacent to the previous wound. 6/15; patient presents for follow-up. She States she has been using Medihoney to the wound bed. Unfortunately when looking at the dressing it looks like she puts the Medihoney on the foam pad and then covers this up with a skin prep cloth. It does not appear that she is actually putting Medihoney directly on the wound bed. She denies signs of infection. Patient History Information obtained from Patient. Family History Cancer, Diabetes, Heart Disease, Hypertension, Kidney Disease, Lung Disease, Stroke. Social History Never smoker, Marital Status - Single, Alcohol Use - Never, Drug Use - No History, Caffeine Use - Daily. Medical History Eyes Patient has history of Cataracts - both eyes Hematologic/Lymphatic Patient has history of Anemia, Lymphedema - legs Cardiovascular Patient has history of Hypertension Endocrine Patient has history of Type II Diabetes Musculoskeletal Patient has history of Osteoarthritis Medical A Surgical History Notes nd Constitutional Symptoms (General Health) Morbid obesity Chronic kidney disease Stage 3 Hematologic/Lymphatic Hyperlipidemia Cardiovascular Hypercholsterolemia Genitourinary CKD stageIII Musculoskeletal Osteoporosis Gout Neurologic CVA Left side hemiparesis Objective Constitutional respirations regular, non-labored and within target range for patient.. Vitals Time Taken: 2:57 PM, Height: 58 in, Temperature: 98.5 F, Pulse: 100 bpm, Respiratory Rate: 20 breaths/min, Blood Pressure: 129/69 mmHg. Psychiatric pleasant and cooperative. General Notes: Left lower extremity: T the posterior thigh located to a significant fat/lymphedema fold there are 2 open wounds with granulation tissue.  No o signs of surrounding infection. Integumentary (Hair, Skin) Wound #1 status is Open. Original cause of wound was Shear/Friction. The date acquired was: 08/16/2020. The wound has been in treatment 15 weeks. The wound is located on the Left,Posterior Lower Leg. The wound measures 0.5cm length x 0.8cm width x 0.1cm depth; 0.314cm^2 area and 0.031cm^3 volume. There is Fat Layer (Subcutaneous Tissue) exposed. There is no tunneling or undermining noted. There is a medium amount of serosanguineous drainage noted. The wound margin is distinct with the outline attached  to the wound base. There is large (67-100%) red granulation within the wound bed. There is no necrotic tissue within the wound bed. Wound #3 status is Open. Original cause of wound was Gradually Appeared. The date acquired was: 12/11/2021. The wound has been in treatment 2 weeks. The wound is located on the Left,Lateral,Posterior Upper Leg. The wound measures 0.2cm length x 0.2cm width x 0.1cm depth; 0.031cm^2 area and 0.003cm^3 volume. There is Fat Layer (Subcutaneous Tissue) exposed. There is no tunneling or undermining noted. There is a medium amount of serosanguineous drainage noted. The wound margin is distinct with the outline attached to the wound base. There is large (67-100%) red granulation within the wound bed. There is no necrotic tissue within the wound bed. Assessment Active Problems ICD-10 Non-pressure chronic ulcer of other part of left lower leg with fat layer exposed Lymphedema, not elsewhere classified Cerebral infarction, unspecified Type 2 diabetes mellitus with other skin ulcer Patient's wounds are stable. I am not sure if she is actually getting Medihoney on the wound bed. I recommended she put this directly on the wound bed and cover it with a dressing. Follow-up in 2 weeks. Plan Follow-up Appointments: Return Appointment in 2 weeks. - 01/11/22 @ 2:45pm with Dr. Mikey Bussing [facility] Laity, Room 7) Bathing/ Shower/  Hygiene: May shower and wash wound with soap and water. Off-Loading: Other: - ensure no pressure or friction/shear to area to aid in wound healing. WOUND #1: - Lower Leg Wound Laterality: Left, Posterior Cleanser: Soap and Water 1 x Per Day/30 Days Discharge Instructions: May shower and wash wound with dial antibacterial soap and water prior to dressing change. Peri-Wound Care: Skin Prep (Generic) 1 x Per Day/30 Days Discharge Instructions: Use skin prep as directed Prim Dressing: MediHoney Gel, tube 1.5 (oz) 1 x Per Day/30 Days ary Discharge Instructions: Apply to wound bed as instructed Secondary Dressing: ALLEVYN Gentle Border, 3x3 (in/in) (Generic) 1 x Per Day/30 Days Discharge Instructions: Apply over primary dressing as directed. WOUND #3: - Upper Leg Wound Laterality: Left, Lateral, Posterior Cleanser: Soap and Water 1 x Per Day/30 Days Discharge Instructions: May shower and wash wound with dial antibacterial soap and water prior to dressing change. Peri-Wound Care: Skin Prep (Generic) 1 x Per Day/30 Days Discharge Instructions: Use skin prep as directed Prim Dressing: MediHoney Gel, tube 1.5 (oz) 1 x Per Day/30 Days ary Discharge Instructions: Apply to wound bed as instructed Secondary Dressing: ALLEVYN Gentle Border, 3x3 (in/in) (Generic) 1 x Per Day/30 Days Discharge Instructions: Apply over primary dressing as directed. 1. Medihoney 2. Follow-up in 2 weeks Electronic Signature(s) Signed: 12/28/2021 5:03:07 PM By: Geralyn Corwin DO Entered By: Geralyn Corwin on 12/28/2021 17:02:17 -------------------------------------------------------------------------------- HxROS Details Patient Name: Date of Service: [name], [name] 12/28/2021 2:45 PM Medical Record Number: [id number] Patient Account Number: [id number] Date of Birth/Sex: Treating RN: 12-30-1945 (76 y.o. [name] Primary Care Provider: Georgann Housekeeper Other Clinician: Referring Provider: Treating  Provider/Extender: Adline Mango in Treatment: 15 Information Obtained From Patient Constitutional Symptoms (General Health) Medical History: Past Medical History Notes: Morbid obesity Chronic kidney disease Stage 3 Eyes Medical History: Positive for: Cataracts - both eyes Hematologic/Lymphatic Medical History: Positive for: Anemia; Lymphedema - legs Past Medical History Notes: Hyperlipidemia Cardiovascular Medical History: Positive for: Hypertension Past Medical History Notes: Hypercholsterolemia Endocrine Medical History: Positive for: Type II Diabetes Time with diabetes: 20+years Treated with: Oral agents Blood sugar tested every day: Yes Tested : Genitourinary Medical History: Past Medical History Notes:  CKD stageIII Musculoskeletal Medical History: Positive for: Osteoarthritis Past Medical History Notes: Osteoporosis Gout Neurologic Medical History: Past Medical History Notes: CVA Left side hemiparesis HBO Extended History Items Eyes: Cataracts Immunizations Pneumococcal Vaccine: Received Pneumococcal Vaccination: Yes Received Pneumococcal Vaccination On or After 60th Birthday: No Implantable Devices None Family and Social History Cancer: Yes; Diabetes: Yes; Heart Disease: Yes; Hypertension: Yes; Kidney Disease: Yes; Lung Disease: Yes; Stroke: Yes; Never smoker; Marital Status - Single; Alcohol Use: Never; Drug Use: No History; Caffeine Use: Daily; Financial Concerns: No; Food, Clothing or Shelter Needs: No; Support System Lacking: No; Transportation Concerns: Yes Electronic Signature(s) Signed: 12/28/2021 5:03:07 PM By: Kalman Shan DO Signed: 12/28/2021 5:26:14 PM By: Lorrin Jackson Entered By: Kalman Shan on 12/28/2021 16:47:51 -------------------------------------------------------------------------------- SuperBill Details Patient Name: Date of Service: [name], [name] 12/28/2021 Medical Record Number:  [id number] Patient Account Number: [id number] Date of Birth/Sex: Treating RN: 1946-07-05 (76 y.o. [name] Primary Care Provider: [name] Other Clinician: Referring Provider: Treating Provider/Extender: [name] in Treatment: 15 Diagnosis Coding ICD-10 Codes Code Description [phone] Non-pressure chronic ulcer of other part of left lower leg with fat layer exposed I89.0 Lymphedema, not elsewhere classified I63.9 Cerebral infarction, unspecified E11.622 Type 2 diabetes mellitus with other skin ulcer Facility Procedures CPT4 Code: [id number] Description: 99213 - WOUND CARE VISIT-LEV 3 EST PT Modifier: Quantity: 1 Physician Procedures : CPT4 Code Description Modifier [id number] - WC PHYS LEVEL 3 - EST PT ICD-10 Diagnosis Description L97.822 Non-pressure chronic ulcer of other part of left lower leg with fat layer exposed I89.0 Lymphedema, not elsewhere classified I63.9 Cerebral  infarction, unspecified E11.622 Type 2 diabetes mellitus with other skin ulcer Quantity: 1 Electronic Signature(s) Signed: 12/28/2021 5:03:07 PM By: Kalman Shan DO Entered By: Kalman Shan on 12/28/2021 17:02:35

## 2022-01-11 ENCOUNTER — Encounter (HOSPITAL_BASED_OUTPATIENT_CLINIC_OR_DEPARTMENT_OTHER): Payer: Medicare Other | Admitting: Internal Medicine

## 2022-01-11 DIAGNOSIS — E1122 Type 2 diabetes mellitus with diabetic chronic kidney disease: Secondary | ICD-10-CM | POA: Diagnosis not present

## 2022-01-11 DIAGNOSIS — N183 Chronic kidney disease, stage 3 unspecified: Secondary | ICD-10-CM | POA: Diagnosis not present

## 2022-01-11 DIAGNOSIS — E785 Hyperlipidemia, unspecified: Secondary | ICD-10-CM | POA: Diagnosis not present

## 2022-01-11 DIAGNOSIS — E1136 Type 2 diabetes mellitus with diabetic cataract: Secondary | ICD-10-CM | POA: Diagnosis not present

## 2022-01-11 DIAGNOSIS — E11621 Type 2 diabetes mellitus with foot ulcer: Secondary | ICD-10-CM | POA: Diagnosis not present

## 2022-01-11 DIAGNOSIS — M1990 Unspecified osteoarthritis, unspecified site: Secondary | ICD-10-CM | POA: Diagnosis not present

## 2022-01-11 DIAGNOSIS — I890 Lymphedema, not elsewhere classified: Secondary | ICD-10-CM | POA: Diagnosis not present

## 2022-01-11 DIAGNOSIS — I69354 Hemiplegia and hemiparesis following cerebral infarction affecting left non-dominant side: Secondary | ICD-10-CM | POA: Diagnosis not present

## 2022-01-11 DIAGNOSIS — E11622 Type 2 diabetes mellitus with other skin ulcer: Secondary | ICD-10-CM | POA: Diagnosis not present

## 2022-01-11 DIAGNOSIS — L97822 Non-pressure chronic ulcer of other part of left lower leg with fat layer exposed: Secondary | ICD-10-CM | POA: Diagnosis not present

## 2022-01-11 DIAGNOSIS — I639 Cerebral infarction, unspecified: Secondary | ICD-10-CM | POA: Diagnosis not present

## 2022-01-11 DIAGNOSIS — I129 Hypertensive chronic kidney disease with stage 1 through stage 4 chronic kidney disease, or unspecified chronic kidney disease: Secondary | ICD-10-CM | POA: Diagnosis not present

## 2022-01-11 NOTE — Progress Notes (Signed)
Laurie Hunter, Laurie Hunter ([id number]) Visit Report for 01/11/2022 Arrival Information Details Patient Name: Date of Service: [name], [name] 01/11/2022 2:45 PM Medical Record Number: [id number] Patient Account Number: [id number] Date of Birth/Sex: Treating RN: [birth year] (76 y.o. Laurie Hunter Primary Care Laurie Hunter: Laurie Hunter Other Clinician: Referring Laurie Hunter: Treating Laurie Hunter/Extender: Laurie Hunter in Treatment: 17 Visit Information History Since Last Visit Added or deleted any medications: No Patient Arrived: Wheel Chair Any new allergies or adverse reactions: No Arrival Time: 14:47 Had a fall or experienced change in No Accompanied By: friend activities of daily living that may affect Transfer Assistance: Manual risk of falls: Patient Identification Verified: Yes Signs or symptoms of abuse/neglect since last visito No Secondary Verification Process Completed: Yes Hospitalized since last visit: No Patient Requires Transmission-Based Precautions: No Implantable device outside of the clinic excluding No Patient Has Alerts: Yes cellular tissue based products placed in the center Patient Alerts: Patient on Blood Thinner since last visit: Has Dressing in Place as Prescribed: Yes Pain Present Now: No Electronic Signature(s) Signed: 01/11/2022 4:43:44 PM By: Laurie Hunter Entered By: Laurie Hunter on 01/11/2022 14:47:31 -------------------------------------------------------------------------------- Encounter Discharge Information Details Patient Name: Date of Service: [name], [name] 01/11/2022 2:45 PM Medical Record Number: [id number] Patient Account Number: [id number] Date of Birth/Sex: Treating RN: [birth year] (76 y.o. Laurie Hunter Primary Care Laurie Hunter: Laurie Hunter Other Clinician: Referring Laurie Hunter: Treating Laurie Hunter/Extender: Laurie Hunter in Treatment: 17 Encounter Discharge Information Items Post  Procedure Vitals Discharge Condition: Stable Temperature (F): 98.5 Ambulatory Status: Wheelchair Pulse (bpm): 99 Discharge Destination: Home Respiratory Rate (breaths/min): 20 Transportation: Private Auto Blood Pressure (mmHg): 159/97 Accompanied By: friend Schedule Follow-up Appointment: Yes Clinical Summary of Care: Provided on 01/11/2022 Form Type Recipient Paper Patient Patient Electronic Signature(s) Signed: 01/11/2022 4:43:44 PM By: Laurie Hunter Entered By: Laurie Hunter on 01/11/2022 15:31:37 -------------------------------------------------------------------------------- Lower Extremity Assessment Details Patient Name: Date of Service: [name], [name] 01/11/2022 2:45 PM Medical Record Number: [id number] Patient Account Number: [id number] Date of Birth/Sex: Treating RN: 29-Oct-1945 (76 y.o. Laurie Hunter Primary Care Laurie Hunter: Laurie Hunter Other Clinician: Referring Laurie Hunter: Treating Laurie Hunter/Extender: Laurie Hunter Weeks in Treatment: 17 Electronic Signature(s) Signed: 01/11/2022 4:43:44 PM By: Laurie Hunter Entered By: Laurie Hunter on 01/11/2022 14:52:38 -------------------------------------------------------------------------------- Multi Wound Chart Details Patient Name: Date of Service: [name], [name] 01/11/2022 2:45 PM Medical Record Number: [id number] Patient Account Number: [id number] Date of Birth/Sex: Treating RN: Aug 08, 1945 (76 y.o. Laurie Hunter Primary Care Laurie Hunter: Laurie Hunter Other Clinician: Referring Laurie Hunter: Treating Laurie Hunter/Extender: Laurie Hunter in Treatment: 17 Vital Signs Height(in): 58 Pulse(bpm): 99 Weight(lbs): Blood Pressure(mmHg): 159/97 Body Mass Index(BMI): Temperature(F): 98.5 Respiratory Rate(breaths/min): 20 Photos: [N/A:N/A] Left, Posterior Lower Leg Left, Lateral, Posterior Upper Leg N/A Wound Location: Shear/Friction Gradually Appeared N/A Wounding  Event: Venous Leg Ulcer Lymphedema N/A Primary Etiology: Cataracts, Anemia, Lymphedema, Cataracts, Anemia, Lymphedema, N/A Comorbid History: Hypertension, Type II Diabetes, Hypertension, Type II Diabetes, Osteoarthritis Osteoarthritis 08/16/2020 12/11/2021 N/A Date Acquired: 17 4 N/A Weeks of Treatment: Open Healed - Epithelialized N/A Wound Status: No No N/A Wound Recurrence: 0.4x0.7x0.1 0x0x0 N/A Measurements L x W x D (cm) 0.22 0 N/A A (cm) : rea 0.022 0 N/A Volume (cm) : 97.50% 100.00% N/A % Reduction in Area: 97.50% 100.00% N/A % Reduction in Volume: Full Thickness Without Exposed Full Thickness Without Exposed N/A Classification: Support Structures Support Structures Medium N/A N/A Exudate Amount: Serosanguineous N/A  N/A Exudate Type: red, brown N/A N/A Exudate Color: Distinct, outline attached N/A N/A Wound Margin: Large (67-100%) N/A N/A Granulation Amount: Red, Pink N/A N/A Granulation Quality: None Present (0%) N/A N/A Necrotic Amount: Fat Layer (Subcutaneous Tissue): Yes N/A N/A Exposed Structures: Fascia: No Tendon: No Muscle: No Joint: No Bone: No Large (67-100%) N/A N/A Epithelialization: Debridement - Excisional N/A N/A Debridement: Pre-procedure Verification/Time Out 15:14 N/A N/A Taken: Subcutaneous, Slough N/A N/A Tissue Debrided: Skin/Subcutaneous Tissue N/A N/A Level: 0.28 N/A N/A Debridement A (sq cm): rea Curette N/A N/A Instrument: Minimum N/A N/A Bleeding: Pressure N/A N/A Hemostasis A chieved: Procedure was tolerated well N/A N/A Debridement Treatment Response: 0.4x0.7x0.1 N/A N/A Post Debridement Measurements L x W x D (cm) 0.022 N/A N/A Post Debridement Volume: (cm) Debridement N/A N/A Procedures Performed: Treatment Notes Wound #1 (Lower Leg) Wound Laterality: Left, Posterior Cleanser Soap and Water Discharge Instruction: May shower and wash wound with dial antibacterial soap and water prior to dressing  change. Peri-Wound Care Skin Prep Discharge Instruction: Use skin prep as directed Topical Primary Dressing MediHoney Gel, tube 1.5 (oz) Discharge Instruction: Apply to wound bed as instructed Secondary Dressing ALLEVYN Gentle Border, 3x3 (in/in) Discharge Instruction: Apply over primary dressing as directed. Secured With Compression Wrap Compression Stockings Facilities manager) Signed: 01/11/2022 3:54:07 PM By: Geralyn Corwin DO Signed: 01/11/2022 4:43:44 PM By: Bo Mcclintock By: Geralyn Corwin on 01/11/2022 15:48:05 -------------------------------------------------------------------------------- Multi-Disciplinary Care Plan Details Patient Name: Date of Service: [name], [name] 01/11/2022 2:45 PM Medical Record Number: [id number] Patient Account Number: [id number] Date of Birth/Sex: Treating RN: 08-Nov-1945 (76 y.o. Laurie Hunter Primary Care Delisia Mcquiston: Laurie Hunter Other Clinician: Referring S[place] Moss: Treating Ellias Mcelreath/Extender: Laurie Hunter in Treatment: 17 Active Inactive Wound/Skin Impairment Nursing Diagnoses: Knowledge deficit related to ulceration/compromised skin integrity Goals: Patient/caregiver will verbalize understanding of skin care regimen Date Initiated: 09/12/2021 Target Resolution Date: 01/11/2022 Goal Status: Active Interventions: Assess patient/caregiver ability to obtain necessary supplies Assess patient/caregiver ability to perform ulcer/skin care regimen upon admission and as needed Provide education on ulcer and skin care Treatment Activities: Skin care regimen initiated : 09/12/2021 Topical wound management initiated : 09/12/2021 Notes: 10/02/21: Wound care regimen continues Electronic Signature(s) Signed: 01/11/2022 4:43:44 PM By: Laurie Hunter Entered By: Laurie Hunter on 01/11/2022 14:46:55 -------------------------------------------------------------------------------- Pain  Assessment Details Patient Name: Date of Service: [name], [name] 01/11/2022 2:45 PM Medical Record Number: [id number] Patient Account Number: [id number] Date of Birth/Sex: Treating RN: 1946/03/13 (76 y.o. Laurie Hunter Primary Care Bartow Zylstra: Laurie Hunter Other Clinician: Referring Tish Begin: Treating Pricilla Moehle/Extender: Laurie Hunter in Treatment: 17 Active Problems Location of Pain Severity and Description of Pain Patient Has Paino No Site Locations Pain Management and Medication Current Pain Management: Electronic Signature(s) Signed: 01/11/2022 4:43:44 PM By: Laurie Hunter Signed: 01/11/2022 4:43:44 PM By: Laurie Hunter Entered By: Laurie Hunter on 01/11/2022 14:52:33 -------------------------------------------------------------------------------- Patient/Caregiver Education Details Patient Name: Date of Service: [name], [name] 6/29/2023andnbsp2:45 PM Medical Record Number: [id number] Patient Account Number: [id number] Date of Birth/Gender: Treating RN: 1946/06/26 (76 y.o. Laurie Hunter Primary Care Physician: Laurie Hunter Other Clinician: Referring Physician: Treating Physician/Extender: Laurie Hunter in Treatment: 17 Education Assessment Education Provided To: Patient and Caregiver Education Topics Provided Wound/Skin Impairment: Methods: Explain/Verbal, Printed Responses: State content correctly Electronic Signature(s) Signed: 01/11/2022 4:43:44 PM By: Laurie Hunter Entered By: Laurie Hunter on 01/11/2022 15:23:16 -------------------------------------------------------------------------------- Wound Assessment Details Patient Name: Date of Service: EV  A NS, [name] 01/11/2022 2:45 PM Medical Record Number: [id number] Patient Account Number: [id number] Date of Birth/Sex: Treating RN: Feb 26, 1946 (76 y.o. Laurie Hunter Primary Care Shatora Weatherbee: Laurie Hunter Other Clinician: Referring  Tanveer Brammer: Treating Ki Luckman/Extender: Laurie Hunter Weeks in Treatment: 17 Wound Status Wound Number: 1 Primary Venous Leg Ulcer Etiology: Wound Location: Left, Posterior Lower Leg Wound Open Wounding Event: Shear/Friction Status: Date Acquired: 08/16/2020 Comorbid Cataracts, Anemia, Lymphedema, Hypertension, Type II Weeks Of Treatment: 17 History: Diabetes, Osteoarthritis Clustered Wound: No Photos Wound Measurements Length: (cm) 0.4 Width: (cm) 0.7 Depth: (cm) 0.1 Area: (cm) 0.22 Volume: (cm) 0.022 % Reduction in Area: 97.5% % Reduction in Volume: 97.5% Epithelialization: Large (67-100%) Tunneling: No Undermining: No Wound Description Classification: Full Thickness Without Exposed Support Structures Wound Margin: Distinct, outline attached Exudate Amount: Medium Exudate Type: Serosanguineous Exudate Color: red, brown Foul Odor After Cleansing: No Slough/Fibrino No Wound Bed Granulation Amount: Large (67-100%) Exposed Structure Granulation Quality: Red, Pink Fascia Exposed: No Necrotic Amount: None Present (0%) Fat Layer (Subcutaneous Tissue) Exposed: Yes Tendon Exposed: No Muscle Exposed: No Joint Exposed: No Bone Exposed: No Treatment Notes Wound #1 (Lower Leg) Wound Laterality: Left, Posterior Cleanser Soap and Water Discharge Instruction: May shower and wash wound with dial antibacterial soap and water prior to dressing change. Peri-Wound Care Skin Prep Discharge Instruction: Use skin prep as directed Topical Primary Dressing MediHoney Gel, tube 1.5 (oz) Discharge Instruction: Apply to wound bed as instructed Secondary Dressing ALLEVYN Gentle Border, 3x3 (in/in) Discharge Instruction: Apply over primary dressing as directed. Secured With Compression Wrap Compression Stockings Facilities manager) Signed: 01/11/2022 4:43:44 PM By: Laurie Hunter Entered By: Laurie Hunter on 01/11/2022  14:59:31 -------------------------------------------------------------------------------- Wound Assessment Details Patient Name: Date of Service: [name], [name] 01/11/2022 2:45 PM Medical Record Number: [id number] Patient Account Number: [id number] Date of Birth/Sex: Treating RN: [birth year] (76 y.o. Laurie Hunter Primary Care [place]ong[place] Ponder: Laurie Hunter Other Clinician: Referring Fey Coghill: Treating Nereida Schepp/Extender: Laurie Hunter Weeks in Treatment: 17 Wound Status Wound Number: 3 Primary Lymphedema Etiology: Wound Location: Left, Lateral, Posterior Upper Leg Wound Healed - Epithelialized Wounding Event: Gradually Appeared Status: Date Acquired: 12/11/2021 Comorbid Cataracts, Anemia, Lymphedema, Hypertension, Type II Weeks Of Treatment: 4 History: Diabetes, Osteoarthritis Clustered Wound: No Photos Wound Measurements Length: (cm) Width: (cm) Depth: (cm) Area: (cm) Volume: (cm) 0 % Reduction in Area: 100% 0 % Reduction in Volume: 100% 0 0 0 Wound Description Classification: Full Thickness Without Exposed Support Structur es Electronic Signature(s) Signed: 01/11/2022 4:43:44 PM By: Laurie Hunter Entered By: Laurie Hunter on 01/11/2022 15:19:42 -------------------------------------------------------------------------------- Vitals Details Patient Name: Date of Service: [name], [name] 01/11/2022 2:45 PM Medical Record Number: [id number] Patient Account Number: [id number] Date of Birth/Sex: Treating RN: 1946/03/28 (76 y.o. Laurie Hunter Primary Care Saydie Gerdts: Laurie Hunter Other Clinician: Referring Sharnette Kitamura: Treating Alera Quevedo/Extender: Laurie Hunter in Treatment: 17 Vital Signs Time Taken: 14:50 Temperature (F): 98.5 Height (in): 58 Pulse (bpm): 99 Respiratory Rate (breaths/min): 20 Blood Pressure (mmHg): 159/97 Reference Range: 80 - 120 mg / dl Electronic Signature(s) Signed: 01/11/2022 4:43:44  PM By: Laurie Hunter Entered By: Laurie Hunter on 01/11/2022 14:53:48

## 2022-01-11 NOTE — Progress Notes (Signed)
Laurie Hunter, Laurie Hunter ([id number]) Visit Report for 01/11/2022 Chief Complaint Document Details Patient Name: Date of Service: Laurie Hunter, Laurie RIA D. 01/11/2022 2:45 PM Medical Record Number: [id number] Patient Account Number: [id number] Date of Birth/Sex: Treating RN: 01/30/1946 (76 y.o. Laurie Hunter Primary Care Provider: Georgann Housekeeper Other Clinician: Referring Provider: Treating Provider/Extender: Adline Mango in Treatment: 17 Information Obtained from: Patient Chief Complaint Left lower extremity wound Electronic Signature(s) Signed: 01/11/2022 3:54:07 PM By: Geralyn Corwin DO Entered By: Geralyn Corwin on 01/11/2022 15:48:14 -------------------------------------------------------------------------------- Debridement Details Patient Name: Date of Service: Laurie Hunter, Laurie RIA D. 01/11/2022 2:45 PM Medical Record Number: [id number] Patient Account Number: [id number] Date of Birth/Sex: Treating RN: 12/12/1945 (76 y.o. Laurie Hunter Primary Care Provider: Georgann Housekeeper Other Clinician: Referring Provider: Treating Provider/Extender: Adline Mango in Treatment: 17 Debridement Performed for Assessment: Wound #1 Left,Posterior Lower Leg Performed By: Physician Geralyn Corwin, DO Debridement Type: Debridement Severity of Tissue Pre Debridement: Fat layer exposed Level of Consciousness (Pre-procedure): Awake and Alert Pre-procedure Verification/Time Out Yes - 15:14 Taken: Start Time: 15:15 T Area Debrided (L x W): otal 0.4 (cm) x 0.7 (cm) = 0.28 (cm) Tissue and other material debrided: Non-Viable, Slough, Subcutaneous, Slough Level: Skin/Subcutaneous Tissue Debridement Description: Excisional Instrument: Curette Bleeding: Minimum Hemostasis Achieved: Pressure End Time: 15:20 Response to Treatment: Procedure was tolerated well Level of Consciousness (Post- Awake and Alert procedure): Post Debridement Measurements of Total  Wound Length: (cm) 0.4 Width: (cm) 0.7 Depth: (cm) 0.1 Volume: (cm) 0.022 Character of Wound/Ulcer Post Debridement: Stable Severity of Tissue Post Debridement: Fat layer exposed Post Procedure Diagnosis Same as Pre-procedure Electronic Signature(s) Signed: 01/11/2022 3:54:07 PM By: Geralyn Corwin DO Signed: 01/11/2022 4:43:44 PM By: Antonieta Iba Entered By: Antonieta Iba on 01/11/2022 15:20:55 -------------------------------------------------------------------------------- HPI Details Patient Name: Date of Service: Laurie Hunter, Laurie RIA D. 01/11/2022 2:45 PM Medical Record Number: [id number] Patient Account Number: [id number] Date of Birth/Sex: Treating RN: [birth year] (76 y.o. Laurie Hunter Primary Care Provider: Georgann Housekeeper Other Clinician: Referring Provider: Treating Provider/Extender: Adline Mango in Treatment: 17 History of Present Illness HPI Description: Admission 09/12/2021 Laurie Hunter is a 76 year old female with a past medical history of type 2 diabetes on oral agents, CVA with left-sided weakness that presents to the clinic for a 1 to 31-month history of wound to her left posterior leg. She states that this started from friction from the mattress she lays on in the chair she sits in. She is not accommodated to help relieve the pressure. She has been followed by her primary care physician for this issue and been treated with 2 rounds of doxycycline. She reports improvement in drainage after taking antibiotics. She currently denies signs of infection. She currently keeps the area covered. 3/8; patient presents for follow-up. She has been using Hydrofera Blue with no issues. She denies signs of infection. 3/20; patient presents for follow-up. She continues to use [facility] with no issues. She has no questions or concerns today. 4/3; patient presents for follow-up. She has been using Hydrofera Blue with no issues. She denies signs of  infection. 4/24; patient presents for follow-up. She been using Hydrofera Blue and antibiotic ointment to the wound beds up until a few days ago when she thought that the wounds had closed. She currently denies any drainage from the previous wound sites. She denies signs of infection. 5/18; patient presents for follow-up. She states she used [employer]	[place] for a week to  the wound bed. She states she has not been doing any dressing changes for the past 1 to 2 weeks. It is unclear why. She currently denies signs of infection. 6/1; patient presents for follow-up. She has been using collagen to the wound bed twice weekly. There is no collagen noted today on intake. She denies signs of infection. She states that a new wound formed adjacent to the previous wound. 6/15; patient presents for follow-up. She States she has been using Medihoney to the wound bed. Unfortunately when looking at the dressing it looks like she puts the Medihoney on the foam pad and then covers this up with a skin prep cloth. It does not appear that she is actually putting Medihoney directly on the wound bed. She denies signs of infection. 6/29; patient presents for follow-up. She has been using Medihoney to the wound bed. One of the wounds is closed. She denies signs of infection. Electronic Signature(s) Signed: 01/11/2022 3:54:07 PM By: Kalman Shan DO Entered By: Kalman Shan on 01/11/2022 15:49:50 -------------------------------------------------------------------------------- Physical Exam Details Patient Name: Date of Service: Laurie Hunter, Laurie RIA D. 01/11/2022 2:45 PM Medical Record Number: [id number] Patient Account Number: [id number] Date of Birth/Sex: Treating RN: [birth year] (76 y.o. Laurie Hunter Primary Care Provider: [name] Other Clinician: Referring Provider: Treating Provider/Extender: Rhetta Mura Weeks in Treatment: 17 Constitutional respirations regular, non-labored and  within target range for patient.. Cardiovascular 2+ dorsalis pedis/posterior tibialis pulses. Psychiatric pleasant and cooperative. Notes Left lower extremity: T the posterior thigh located to a significant fat/lymphedema fold there is one open wound with granulation tissue And nonviable tissue. o No signs of surrounding infection. Electronic Signature(s) Signed: 01/11/2022 3:54:07 PM By: Kalman Shan DO Entered By: Kalman Shan on 01/11/2022 15:50:48 -------------------------------------------------------------------------------- Physician Orders Details Patient Name: Date of Service: Laurie Hunter, Laurie RIA D. 01/11/2022 2:45 PM Medical Record Number: [id number] Patient Account Number: [id number] Date of Birth/Sex: Treating RN: October 02, 1945 (76 y.o. Laurie Hunter Primary Care Provider: [name] Other Clinician: Referring Provider: Treating Provider/Extender: [name] in Treatment: [phone] Verbal / Phone Orders: No Diagnosis Coding ICD-10 Coding Code Description [phone] Non-pressure chronic ulcer of other part of left lower leg with fat layer exposed I89.0 Lymphedema, not elsewhere classified I63.9 Cerebral infarction, unspecified E11.622 Type 2 diabetes mellitus with other skin ulcer Follow-up Appointments ppointment in 2 weeks. - 01/25/22 @ 2:45pm with Dr. Heber [place] [facility], Room 7) Return A Bathing/ Shower/ Hygiene May shower and wash wound with soap and water. Off-Loading Other: - ensure no pressure or friction/shear to area to aid in wound healing. Wound Treatment Wound #1 - Lower Leg Wound Laterality: Left, Posterior Cleanser: Soap and Water 1 x Per Day/30 Days Discharge Instructions: May shower and wash wound with dial antibacterial soap and water prior to dressing change. Peri-Wound Care: Skin Prep (Generic) 1 x Per Day/30 Days Discharge Instructions: Use skin prep as directed Prim Dressing: MediHoney Gel, tube 1.5 (oz) 1 x Per Day/30  Days ary Discharge Instructions: Apply to wound bed as instructed Secondary Dressing: ALLEVYN Gentle Border, 3x3 (in/in) (Generic) 1 x Per Day/30 Days Discharge Instructions: Apply over primary dressing as directed. Electronic Signature(s) Signed: 01/11/2022 3:54:07 PM By: Kalman Shan DO Entered By: Kalman Shan on 01/11/2022 15:51:04 -------------------------------------------------------------------------------- Problem List Details Patient Name: Date of Service: Laurie Hunter, Laurie RIA D. 01/11/2022 2:45 PM Medical Record Number: [id number] Patient Account Number: [id number] Date of Birth/Sex: Treating RN: 1946/03/02 (76 y.o. Laurie Hunter Primary Care Provider:  Georgann Housekeeper Other Clinician: Referring Provider: Treating Provider/Extender: Adline Mango in Treatment: 17 Active Problems ICD-10 Encounter Code Description Active Date MDM Diagnosis [phone] Non-pressure chronic ulcer of other part of left lower leg with fat layer exposed2/28/2023 No Yes I89.0 Lymphedema, not elsewhere classified 09/12/2021 No Yes I63.9 Cerebral infarction, unspecified 09/12/2021 No Yes E11.622 Type 2 diabetes mellitus with other skin ulcer 09/12/2021 No Yes Inactive Problems Resolved Problems Electronic Signature(s) Signed: 01/11/2022 3:54:07 PM By: Geralyn Corwin DO Entered By: Geralyn Corwin on 01/11/2022 15:48:01 -------------------------------------------------------------------------------- Progress Note Details Patient Name: Date of Service: Laurie Hunter, Laurie RIA D. 01/11/2022 2:45 PM Medical Record Number: [id number] Patient Account Number: [id number] Date of Birth/Sex: Treating RN: Jul 19, 1945 (76 y.o. Laurie Hunter Primary Care Provider: Georgann Housekeeper Other Clinician: Referring Provider: Treating Provider/Extender: Adline Mango in Treatment: 17 Subjective Chief Complaint Information obtained from Patient Left lower extremity  wound History of Present Illness (HPI) Admission 09/12/2021 Ms. Arnette Driggs is a 76 year old female with a past medical history of type 2 diabetes on oral agents, CVA with left-sided weakness that presents to the clinic for a 1 to 60-month history of wound to her left posterior leg. She states that this started from friction from the mattress she lays on in the chair she sits in. She is not accommodated to help relieve the pressure. She has been followed by her primary care physician for this issue and been treated with 2 rounds of doxycycline. She reports improvement in drainage after taking antibiotics. She currently denies signs of infection. She currently keeps the area covered. 3/8; patient presents for follow-up. She has been using Hydrofera Blue with no issues. She denies signs of infection. 3/20; patient presents for follow-up. She continues to use [facility] with no issues. She has no questions or concerns today. 4/3; patient presents for follow-up. She has been using Hydrofera Blue with no issues. She denies signs of infection. 4/24; patient presents for follow-up. She been using Hydrofera Blue and antibiotic ointment to the wound beds up until a few days ago when she thought that the wounds had closed. She currently denies any drainage from the previous wound sites. She denies signs of infection. 5/18; patient presents for follow-up. She states she used Hydrofera Blue for a week to the wound bed. She states she has not been doing any dressing changes for the past 1 to 2 weeks. It is unclear why. She currently denies signs of infection. 6/1; patient presents for follow-up. She has been using collagen to the wound bed twice weekly. There is no collagen noted today on intake. She denies signs of infection. She states that a new wound formed adjacent to the previous wound. 6/15; patient presents for follow-up. She States she has been using Medihoney to the wound bed. Unfortunately when  looking at the dressing it looks like she puts the Medihoney on the foam pad and then covers this up with a skin prep cloth. It does not appear that she is actually putting Medihoney directly on the wound bed. She denies signs of infection. 6/29; patient presents for follow-up. She has been using Medihoney to the wound bed. One of the wounds is closed. She denies signs of infection. Patient History Information obtained from Patient. Family History Cancer, Diabetes, Heart Disease, Hypertension, Kidney Disease, Lung Disease, Stroke. Social History Never smoker, Marital Status - Single, Alcohol Use - Never, Drug Use - No History, Caffeine Use - Daily. Medical History Eyes Patient has history  of Cataracts - both eyes Hematologic/Lymphatic Patient has history of Anemia, Lymphedema - legs Cardiovascular Patient has history of Hypertension Endocrine Patient has history of Type II Diabetes Musculoskeletal Patient has history of Osteoarthritis Medical A Surgical History Notes nd Constitutional Symptoms (General Health) Morbid obesity Chronic kidney disease Stage 3 Hematologic/Lymphatic Hyperlipidemia Cardiovascular Hypercholsterolemia Genitourinary CKD stageIII Musculoskeletal Osteoporosis Gout Neurologic CVA Left side hemiparesis Objective Constitutional respirations regular, non-labored and within target range for patient.. Vitals Time Taken: 2:50 PM, Height: 58 in, Temperature: 98.5 F, Pulse: 99 bpm, Respiratory Rate: 20 breaths/min, Blood Pressure: 159/97 mmHg. Cardiovascular 2+ dorsalis pedis/posterior tibialis pulses. Psychiatric pleasant and cooperative. General Notes: Left lower extremity: T the posterior thigh located to a significant fat/lymphedema fold there is one open wound with granulation tissue And o nonviable tissue. No signs of surrounding infection. Integumentary (Hair, Skin) Wound #1 status is Open. Original cause of wound was Shear/Friction. The date  acquired was: 08/16/2020. The wound has been in treatment 17 weeks. The wound is located on the Left,Posterior Lower Leg. The wound measures 0.4cm length x 0.7cm width x 0.1cm depth; 0.22cm^2 area and 0.022cm^3 volume. There is Fat Layer (Subcutaneous Tissue) exposed. There is no tunneling or undermining noted. There is a medium amount of serosanguineous drainage noted. The wound margin is distinct with the outline attached to the wound base. There is large (67-100%) red, pink granulation within the wound bed. There is no necrotic tissue within the wound bed. Wound #3 status is Healed - Epithelialized. Original cause of wound was Gradually Appeared. The date acquired was: 12/11/2021. The wound has been in treatment 4 weeks. The wound is located on the Left,Lateral,Posterior Upper Leg. The wound measures 0cm length x 0cm width x 0cm depth; 0cm^2 area and 0cm^3 volume. Assessment Active Problems ICD-10 Non-pressure chronic ulcer of other part of left lower leg with fat layer exposed Lymphedema, not elsewhere classified Cerebral infarction, unspecified Type 2 diabetes mellitus with other skin ulcer Patient has done well with Medihoney. 1 wound is closed. I debrided nonviable tissue to the remaining wound. I recommended continuing Medihoney. Follow- up in 2 weeks. Procedures Wound #1 Pre-procedure diagnosis of Wound #1 is a Venous Leg Ulcer located on the Left,Posterior Lower Leg .Severity of Tissue Pre Debridement is: Fat layer exposed. There was a Excisional Skin/Subcutaneous Tissue Debridement with a total area of 0.28 sq cm performed by Kalman Shan, DO. With the following instrument(s): Curette to remove Non-Viable tissue/material. Material removed includes Subcutaneous Tissue and Slough and. No specimens were taken. A time out was conducted at 15:14, prior to the start of the procedure. A Minimum amount of bleeding was controlled with Pressure. The procedure was tolerated well. Post  Debridement Measurements: 0.4cm length x 0.7cm width x 0.1cm depth; 0.022cm^3 volume. Character of Wound/Ulcer Post Debridement is stable. Severity of Tissue Post Debridement is: Fat layer exposed. Post procedure Diagnosis Wound #1: Same as Pre-Procedure Plan Follow-up Appointments: Return Appointment in 2 weeks. - 01/25/22 @ 2:45pm with Dr. Heber  [facility], Room 7) Bathing/ Shower/ Hygiene: May shower and wash wound with soap and water. Off-Loading: Other: - ensure no pressure or friction/shear to area to aid in wound healing. WOUND #1: - Lower Leg Wound Laterality: Left, Posterior Cleanser: Soap and Water 1 x Per Day/30 Days Discharge Instructions: May shower and wash wound with dial antibacterial soap and water prior to dressing change. Peri-Wound Care: Skin Prep (Generic) 1 x Per Day/30 Days Discharge Instructions: Use skin prep as directed Prim Dressing: MediHoney Gel, tube 1.5 (  oz) 1 x Per Day/30 Days ary Discharge Instructions: Apply to wound bed as instructed Secondary Dressing: ALLEVYN Gentle Border, 3x3 (in/in) (Generic) 1 x Per Day/30 Days Discharge Instructions: Apply over primary dressing as directed. 1. In office sharp debridement 2. Medihoney 3. Follow-up in 2 weeks Electronic Signature(s) Signed: 01/11/2022 3:54:07 PM By: Kalman Shan DO Entered By: Kalman Shan on 01/11/2022 15:52:12 -------------------------------------------------------------------------------- HxROS Details Patient Name: Date of Service: Laurie Hunter, Laurie RIA D. 01/11/2022 2:45 PM Medical Record Number: [id number] Patient Account Number: [id number] Date of Birth/Sex: Treating RN: 12-10-1945 (76 y.o. Laurie Hunter Primary Care Provider: [name] Other Clinician: Referring Provider: Treating Provider/Extender: [name] in Treatment: 17 Information Obtained From Patient Constitutional Symptoms (General Health) Medical History: Past Medical History  Notes: Morbid obesity Chronic kidney disease Stage 3 Eyes Medical History: Positive for: Cataracts - both eyes Hematologic/Lymphatic Medical History: Positive for: Anemia; Lymphedema - legs Past Medical History Notes: Hyperlipidemia Cardiovascular Medical History: Positive for: Hypertension Past Medical History Notes: Hypercholsterolemia Endocrine Medical History: Positive for: Type II Diabetes Time with diabetes: 20+years Treated with: Oral agents Blood sugar tested every day: Yes Tested : Genitourinary Medical History: Past Medical History Notes: CKD stageIII Musculoskeletal Medical History: Positive for: Osteoarthritis Past Medical History Notes: Osteoporosis Gout Neurologic Medical History: Past Medical History Notes: CVA Left side hemiparesis HBO Extended History Items Eyes: Cataracts Immunizations Pneumococcal Vaccine: Received Pneumococcal Vaccination: Yes Received Pneumococcal Vaccination On or After 60th Birthday: No Implantable Devices None Family and Social History Cancer: Yes; Diabetes: Yes; Heart Disease: Yes; Hypertension: Yes; Kidney Disease: Yes; Lung Disease: Yes; Stroke: Yes; Never smoker; Marital Status - Single; Alcohol Use: Never; Drug Use: No History; Caffeine Use: Daily; Financial Concerns: No; Food, Clothing or Shelter Needs: No; Support System Lacking: No; Transportation Concerns: Yes Electronic Signature(s) Signed: 01/11/2022 3:54:07 PM By: Kalman Shan DO Signed: 01/11/2022 4:43:44 PM By: Lorrin Jackson Entered By: Kalman Shan on 01/11/2022 15:49:55 -------------------------------------------------------------------------------- SuperBill Details Patient Name: Date of Service: Laurie Hunter, Laurie RIA D. 01/11/2022 Medical Record Number: [id number] Patient Account Number: [id number] Date of Birth/Sex: Treating RN: 1946-05-16 (76 y.o. Laurie Hunter Primary Care Provider: [name] Other Clinician: Referring  Provider: Treating Provider/Extender: [name] in Treatment: 17 Diagnosis Coding ICD-10 Codes Code Description [phone] Non-pressure chronic ulcer of other part of left lower leg with fat layer exposed I89.0 Lymphedema, not elsewhere classified I63.9 Cerebral infarction, unspecified E11.622 Type 2 diabetes mellitus with other skin ulcer Facility Procedures CPT4 Code: [id number] Description: [id number] - DEB SUBQ TISSUE 20 SQ CM/< ICD-10 Diagnosis Description L97.822 Non-pressure chronic ulcer of other part of left lower leg with fat layer expo Modifier: sed Quantity: 1 Physician Procedures : CPT4 Code Description Modifier [id number] - WC PHYS SUBQ TISS 20 SQ CM ICD-10 Diagnosis Description L97.822 Non-pressure chronic ulcer of other part of left lower leg with fat layer exposed Quantity: 1 Electronic Signature(s) Signed: 01/11/2022 3:54:07 PM By: Kalman Shan DO Entered By: Kalman Shan on 01/11/2022 15:53:01

## 2022-01-25 ENCOUNTER — Encounter (HOSPITAL_BASED_OUTPATIENT_CLINIC_OR_DEPARTMENT_OTHER): Payer: Medicare Other | Attending: Internal Medicine | Admitting: Internal Medicine

## 2022-01-25 DIAGNOSIS — I10 Essential (primary) hypertension: Secondary | ICD-10-CM | POA: Diagnosis not present

## 2022-01-25 DIAGNOSIS — I639 Cerebral infarction, unspecified: Secondary | ICD-10-CM | POA: Diagnosis not present

## 2022-01-25 DIAGNOSIS — I890 Lymphedema, not elsewhere classified: Secondary | ICD-10-CM | POA: Diagnosis not present

## 2022-01-25 DIAGNOSIS — I69354 Hemiplegia and hemiparesis following cerebral infarction affecting left non-dominant side: Secondary | ICD-10-CM | POA: Diagnosis not present

## 2022-01-25 DIAGNOSIS — E782 Mixed hyperlipidemia: Secondary | ICD-10-CM | POA: Diagnosis not present

## 2022-01-25 DIAGNOSIS — E1122 Type 2 diabetes mellitus with diabetic chronic kidney disease: Secondary | ICD-10-CM | POA: Diagnosis not present

## 2022-01-25 DIAGNOSIS — E11622 Type 2 diabetes mellitus with other skin ulcer: Secondary | ICD-10-CM | POA: Diagnosis not present

## 2022-01-25 DIAGNOSIS — Z7984 Long term (current) use of oral hypoglycemic drugs: Secondary | ICD-10-CM | POA: Diagnosis not present

## 2022-01-25 DIAGNOSIS — N1831 Chronic kidney disease, stage 3a: Secondary | ICD-10-CM | POA: Diagnosis not present

## 2022-01-25 DIAGNOSIS — L97822 Non-pressure chronic ulcer of other part of left lower leg with fat layer exposed: Secondary | ICD-10-CM | POA: Insufficient documentation

## 2022-01-25 NOTE — Progress Notes (Signed)
YOMARIS, PALECEK ([id number]) Visit Report for 01/25/2022 Chief Complaint Document Details Patient Name: Date of Service: Laurie A NS, GLO RIA Hunter. 01/25/2022 2:45 PM Medical Record Number: [id number] Patient Account Number: [id number] Date of Birth/Sex: Treating RN: June 04, 1946 (76 y.o. Laurie Hunter Primary Care Provider: Georgann Housekeeper Other Clinician: Referring Provider: Treating Provider/Extender: Adline Mango in Treatment: 19 Information Obtained from: Patient Chief Complaint Left lower extremity wound Electronic Signature(s) Signed: 01/25/2022 3:21:49 PM By: Geralyn Corwin DO Entered By: Geralyn Corwin on 01/25/2022 15:08:20 -------------------------------------------------------------------------------- HPI Details Patient Name: Date of Service: Laurie A NS, GLO RIA Hunter. 01/25/2022 2:45 PM Medical Record Number: [id number] Patient Account Number: [id number] Date of Birth/Sex: Treating RN: April 04, 1946 (76 y.o. Laurie Hunter Primary Care Provider: Georgann Housekeeper Other Clinician: Referring Provider: Treating Provider/Extender: Adline Mango in Treatment: 19 History of Present Illness HPI Description: Admission 09/12/2021 Laurie Hunter is a 76 year old female with a past medical history of type 2 diabetes on oral agents, CVA with left-sided weakness that presents to the clinic for a 1 to 33-month history of wound to her left posterior leg. She states that this started from friction from the mattress she lays on in the chair she sits in. She is not accommodated to help relieve the pressure. She has been followed by her primary care physician for this issue and been treated with 2 rounds of doxycycline. She reports improvement in drainage after taking antibiotics. She currently denies signs of infection. She currently keeps the area covered. 3/8; patient presents for follow-up. She has been using Hydrofera Blue with no issues. She denies  signs of infection. 3/20; patient presents for follow-up. She continues to use [facility] with no issues. She has no questions or concerns today. 4/3; patient presents for follow-up. She has been using Hydrofera Blue with no issues. She denies signs of infection. 4/24; patient presents for follow-up. She been using Hydrofera Blue and antibiotic ointment to the wound beds up until a few days ago when she thought that the wounds had closed. She currently denies any drainage from the previous wound sites. She denies signs of infection. 5/18; patient presents for follow-up. She states she used Hydrofera Blue for a week to the wound bed. She states she has not been doing any dressing changes for the past 1 to 2 weeks. It is unclear why. She currently denies signs of infection. 6/1; patient presents for follow-up. She has been using collagen to the wound bed twice weekly. There is no collagen noted today on intake. She denies signs of infection. She states that a new wound formed adjacent to the previous wound. 6/15; patient presents for follow-up. She States she has been using Medihoney to the wound bed. Unfortunately when looking at the dressing it looks like she puts the Medihoney on the foam pad and then covers this up with a skin prep cloth. It does not appear that she is actually putting Medihoney directly on the wound bed. She denies signs of infection. 6/29; patient presents for follow-up. She has been using Medihoney to the wound bed. One of the wounds is closed. She denies signs of infection. 7/13; patient presents for follow-up. She has been using Medihoney to the wound bed. She has no issues or complaints today. Electronic Signature(s) Signed: 01/25/2022 3:21:49 PM By: Geralyn Corwin DO Entered By: Geralyn Corwin on 01/25/2022 15:08:40 -------------------------------------------------------------------------------- Physical Exam Details Patient Name: Date of Service: Laurie A NS, GLO RIA  Hunter. 01/25/2022 2:45 PM  Medical Record Number: [id number] Patient Account Number: [id number] Date of Birth/Sex: Treating RN: 08-10-1945 (76 y.o. Laurie Hunter Primary Care Provider: Georgann Housekeeper Other Clinician: Referring Provider: Treating Provider/Extender: Orvan Seen Weeks in Treatment: 19 Constitutional respirations regular, non-labored and within target range for patient.Marland Kitchen Psychiatric pleasant and cooperative. Notes Left lower extremity: T the posterior thigh there is a significant fat/lymphedema fold. There is a thin layer of epithelization over the previous wound site. No o surrounding signs of infection. Electronic Signature(s) Signed: 01/25/2022 3:21:49 PM By: Geralyn Corwin DO Entered By: Geralyn Corwin on 01/25/2022 15:09:25 -------------------------------------------------------------------------------- Physician Orders Details Patient Name: Date of Service: Laurie A NS, GLO RIA Hunter. 01/25/2022 2:45 PM Medical Record Number: [id number] Patient Account Number: [id number] Date of Birth/Sex: Treating RN: 27-Mar-1946 (76 y.o. Laurie Hunter Primary Care Provider: Georgann Housekeeper Other Clinician: Referring Provider: Treating Provider/Extender: Adline Mango in Treatment: 7 Verbal / Phone Orders: No Diagnosis Coding ICD-10 Coding Code Description [phone] Non-pressure chronic ulcer of other part of left lower leg with fat layer exposed I89.0 Lymphedema, not elsewhere classified I63.9 Cerebral infarction, unspecified E11.622 Type 2 diabetes mellitus with other skin ulcer [facility]charge From [facility] - No further follow up needed, call if you notice any drainage or changes. Non Wound Condition pply the following to affected area as directed: - Apply bandage to area for protection. A Electronic Signature(s) Signed: 01/25/2022 3:21:49 PM By: Geralyn Corwin DO Entered By: Geralyn Corwin on  01/25/2022 15:09:33 -------------------------------------------------------------------------------- Problem List Details Patient Name: Date of Service: Laurie A NS, GLO RIA Hunter. 01/25/2022 2:45 PM Medical Record Number: [id number] Patient Account Number: [id number] Date of Birth/Sex: Treating RN: September 28, 1945 (76 y.o. Laurie Hunter Primary Care Provider: Georgann Housekeeper Other Clinician: Referring Provider: Treating Provider/Extender: Adline Mango in Treatment: 19 Active Problems ICD-10 Encounter Code Description Active Date MDM Diagnosis [phone] Non-pressure chronic ulcer of other part of left lower leg with fat layer exposed2/28/2023 No Yes I89.0 Lymphedema, not elsewhere classified 09/12/2021 No Yes I63.9 Cerebral infarction, unspecified 09/12/2021 No Yes E11.622 Type 2 diabetes mellitus with other skin ulcer 09/12/2021 No Yes Inactive Problems Resolved Problems Electronic Signature(s) Signed: 01/25/2022 3:21:49 PM By: Geralyn Corwin DO Entered By: Geralyn Corwin on 01/25/2022 15:08:04 -------------------------------------------------------------------------------- Progress Note Details Patient Name: Date of Service: Laurie A NS, GLO RIA Hunter. 01/25/2022 2:45 PM Medical Record Number: [id number] Patient Account Number: [id number] Date of Birth/Sex: Treating RN: April 16, 1946 (76 y.o. Laurie Hunter Primary Care Provider: Georgann Housekeeper Other Clinician: Referring Provider: Treating Provider/Extender: Adline Mango in Treatment: 19 Subjective Chief Complaint Information obtained from Patient Left lower extremity wound History of Present Illness (HPI) Admission 09/12/2021 Ms. Laurie Hunter is a 76 year old female with a past medical history of type 2 diabetes on oral agents, CVA with left-sided weakness that presents to the clinic for a 1 to 60-month history of wound to her left posterior leg. She states that this started from friction from  the mattress she lays on in the chair she sits in. She is not accommodated to help relieve the pressure. She has been followed by her primary care physician for this issue and been treated with 2 rounds of doxycycline. She reports improvement in drainage after taking antibiotics. She currently denies signs of infection. She currently keeps the area covered. 3/8; patient presents for follow-up. She has been using Hydrofera Blue with no issues. She denies signs of infection. 3/20; patient presents for follow-up.  She continues to use [facility] with no issues. She has no questions or concerns today. 4/3; patient presents for follow-up. She has been using Hydrofera Blue with no issues. She denies signs of infection. 4/24; patient presents for follow-up. She been using Hydrofera Blue and antibiotic ointment to the wound beds up until a few days ago when she thought that the wounds had closed. She currently denies any drainage from the previous wound sites. She denies signs of infection. 5/18; patient presents for follow-up. She states she used Hydrofera Blue for a week to the wound bed. She states she has not been doing any dressing changes for the past 1 to 2 weeks. It is unclear why. She currently denies signs of infection. 6/1; patient presents for follow-up. She has been using collagen to the wound bed twice weekly. There is no collagen noted today on intake. She denies signs of infection. She states that a new wound formed adjacent to the previous wound. 6/15; patient presents for follow-up. She States she has been using Medihoney to the wound bed. Unfortunately when looking at the dressing it looks like she puts the Medihoney on the foam pad and then covers this up with a skin prep cloth. It does not appear that she is actually putting Medihoney directly on the wound bed. She denies signs of infection. 6/29; patient presents for follow-up. She has been using Medihoney to the wound bed. One of the  wounds is closed. She denies signs of infection. 7/13; patient presents for follow-up. She has been using Medihoney to the wound bed. She has no issues or complaints today. Patient History Information obtained from Patient. Family History Cancer, Diabetes, Heart Disease, Hypertension, Kidney Disease, Lung Disease, Stroke. Social History Never smoker, Marital Status - Single, Alcohol Use - Never, Drug Use - No History, Caffeine Use - Daily. Medical History Eyes Patient has history of Cataracts - both eyes Hematologic/Lymphatic Patient has history of Anemia, Lymphedema - legs Cardiovascular Patient has history of Hypertension Endocrine Patient has history of Type II Diabetes Musculoskeletal Patient has history of Osteoarthritis Medical A Surgical History Notes nd Constitutional Symptoms (General Health) Morbid obesity Chronic kidney disease Stage 3 Hematologic/Lymphatic Hyperlipidemia Cardiovascular Hypercholsterolemia Genitourinary CKD stageIII Musculoskeletal Osteoporosis Gout Neurologic CVA Left side hemiparesis Objective Constitutional respirations regular, non-labored and within target range for patient.. Vitals Time Taken: 2:52 PM, Height: 58 in, Temperature: 99.8 F, Pulse: 102 bpm, Respiratory Rate: 20 breaths/min, Blood Pressure: 131/81 mmHg. Psychiatric pleasant and cooperative. General Notes: Left lower extremity: T the posterior thigh there is a significant fat/lymphedema fold. There is a thin layer of epithelization over the previous o wound site. No surrounding signs of infection. Integumentary (Hair, Skin) Wound #1 status is Healed - Epithelialized. Original cause of wound was Shear/Friction. The date acquired was: 08/16/2020. The wound has been in treatment 19 weeks. The wound is located on the Left,Posterior Lower Leg. The wound measures 0cm length x 0cm width x 0cm depth; 0cm^2 area and 0cm^3 volume. Assessment Active Problems ICD-10 Non-pressure chronic  ulcer of other part of left lower leg with fat layer exposed Lymphedema, not elsewhere classified Cerebral infarction, unspecified Type 2 diabetes mellitus with other skin ulcer Patient has done well with Medihoney. The wound has healed. I recommended she keep the area protected for the next 1 to 2 weeks. Follow-up as needed. Plan Discharge From [facility]: Discharge from [facility] - No further follow up needed, call if you notice any drainage or changes. Non Wound Condition:  Apply the following to affected area as directed: - Apply bandage to area for protection. 1. Medihoney 2. Follow-up as needed 3. Discharge from clinic due to closed wound Electronic Signature(s) Signed: 01/25/2022 3:21:49 PM By: Kalman Shan DO Entered By: Kalman Shan on 01/25/2022 15:10:24 -------------------------------------------------------------------------------- HxROS Details Patient Name: Date of Service: Laurie A NS, GLO RIA Hunter. 01/25/2022 2:45 PM Medical Record Number: [id number] Patient Account Number: [id number] Date of Birth/Sex: Treating RN: 1946/02/16 (76 y.o. [name] Primary Care Provider: [name] Other Clinician: Referring Provider: Treating Provider/Extender: [name] in Treatment: 19 Information Obtained From Patient Constitutional Symptoms (General Health) Medical History: Past Medical History Notes: Morbid obesity Chronic kidney disease Stage 3 Eyes Medical History: Positive for: Cataracts - both eyes Hematologic/Lymphatic Medical History: Positive for: Anemia; Lymphedema - legs Past Medical History Notes: Hyperlipidemia Cardiovascular Medical History: Positive for: Hypertension Past Medical History Notes: Hypercholsterolemia Endocrine Medical History: Positive for: Type II Diabetes Time with diabetes: 20+years Treated with: Oral agents Blood sugar tested every day: Yes Tested : Genitourinary Medical  History: Past Medical History Notes: CKD stageIII Musculoskeletal Medical History: Positive for: Osteoarthritis Past Medical History Notes: Osteoporosis Gout Neurologic Medical History: Past Medical History Notes: CVA Left side hemiparesis HBO Extended History Items Eyes: Cataracts Immunizations Pneumococcal Vaccine: Received Pneumococcal Vaccination: Yes Received Pneumococcal Vaccination On or After 60th Birthday: No Implantable Devices None Family and Social History Cancer: Yes; Diabetes: Yes; Heart Disease: Yes; Hypertension: Yes; Kidney Disease: Yes; Lung Disease: Yes; Stroke: Yes; Never smoker; Marital Status - Single; Alcohol Use: Never; Drug Use: No History; Caffeine Use: Daily; Financial Concerns: No; Food, Clothing or Shelter Needs: No; Support System Lacking: No; Transportation Concerns: Yes Electronic Signature(s) Signed: 01/25/2022 3:21:49 PM By: Kalman Shan DO Signed: 01/25/2022 4:36:00 PM By: Lorrin Jackson Entered By: Kalman Shan on 01/25/2022 15:08:46 -------------------------------------------------------------------------------- SuperBill Details Patient Name: Date of Service: Laurie A NS, GLO RIA Hunter. 01/25/2022 Medical Record Number: [id number] Patient Account Number: [id number] Date of Birth/Sex: Treating RN: 10/15/1945 (76 y.o. [name] Primary Care Provider: [name] Other Clinician: Referring Provider: Treating Provider/Extender: [name] in Treatment: 19 Diagnosis Coding ICD-10 Codes Code Description [phone] Non-pressure chronic ulcer of other part of left lower leg with fat layer exposed I89.0 Lymphedema, not elsewhere classified I63.9 Cerebral infarction, unspecified E11.622 Type 2 diabetes mellitus with other skin ulcer Facility Procedures CPT4 Code: [id number] Description: [phone] - WOUND CARE VISIT-LEV 2 EST PT Modifier: Quantity: 1 Physician Procedures : CPT4 Code Description Modifier  [id number] - WC PHYS LEVEL 3 - EST PT ICD-10 Diagnosis Description L97.822 Non-pressure chronic ulcer of other part of left lower leg with fat layer exposed I89.0 Lymphedema, not elsewhere classified I63.9 Cerebral  infarction, unspecified E11.622 Type 2 diabetes mellitus with other skin ulcer Quantity: 1 Electronic Signature(s) Signed: 01/25/2022 3:21:49 PM By: Kalman Shan DO Entered By: Kalman Shan on 01/25/2022 15:10:40

## 2022-01-25 NOTE — Progress Notes (Signed)
Laurie Hunter, Laurie Hunter ([id number]) Visit Report for 01/25/2022 Arrival Information Details Patient Name: Date of Service: Laurie Hunter, Laurie RIA D. 01/25/2022 2:45 PM Medical Record Number: [id number] Patient Account Number: [id number] Date of Birth/Sex: Treating RN: 10-17-1945 (76 y.o. [name] Primary Care Teddrick Mallari: Georgann Housekeeper Other Clinician: Referring Latessa Tillis: Treating Ame Heagle/Extender: Adline Mango in Treatment: 19 Visit Information History Since Last Visit Added or deleted any medications: No Patient Arrived: Wheel Chair Any new allergies or adverse reactions: No Arrival Time: 14:49 Had a fall or experienced change in No Accompanied By: friend activities of daily living that may affect Transfer Assistance: None risk of falls: Patient Identification Verified: Yes Signs or symptoms of abuse/neglect since last visito No Secondary Verification Process Completed: Yes Hospitalized since last visit: No Patient Requires Transmission-Based Precautions: No Implantable device outside of the clinic excluding No Patient Has Alerts: Yes cellular tissue based products placed in the center Patient Alerts: Patient on Blood Thinner since last visit: Has Dressing in Place as Prescribed: Yes Pain Present Now: No Electronic Signature(s) Signed: 01/25/2022 4:36:00 PM By: Antonieta Iba Entered By: Antonieta Iba on 01/25/2022 14:49:31 -------------------------------------------------------------------------------- Clinic Level of Care Assessment Details Patient Name: Date of Service: Laurie Hunter, Laurie RIA D. 01/25/2022 2:45 PM Medical Record Number: [id number] Patient Account Number: [id number] Date of Birth/Sex: Treating RN: [birth year] (76 y.o. [name] Primary Care Haset Oaxaca: Georgann Housekeeper Other Clinician: Referring Yukiko Minnich: Treating Charma Mocarski/Extender: Adline Mango in Treatment: 19 Clinic Level of Care Assessment Items TOOL 4  Quantity Score X- 1 0 Use when only an EandM is performed on FOLLOW-UP visit ASSESSMENTS - Nursing Assessment / Reassessment X- 1 10 Reassessment of Co-morbidities (includes updates in patient status) X- 1 5 Reassessment of Adherence to Treatment Plan ASSESSMENTS - Wound and Skin A ssessment / Reassessment X - Simple Wound Assessment / Reassessment - one wound 1 5 []  - 0 Complex Wound Assessment / Reassessment - multiple wounds []  - 0 Dermatologic / Skin Assessment (not related to wound area) ASSESSMENTS - Focused Assessment []  - 0 Circumferential Edema Measurements - multi extremities []  - 0 Nutritional Assessment / Counseling / Intervention []  - 0 Lower Extremity Assessment (monofilament, tuning fork, pulses) []  - 0 Peripheral Arterial Disease Assessment (using hand held doppler) ASSESSMENTS - Ostomy and/or Continence Assessment and Care []  - 0 Incontinence Assessment and Management []  - 0 Ostomy Care Assessment and Management (repouching, etc.) PROCESS - Coordination of Care []  - 0 Simple Patient / Family Education for ongoing care X- 1 20 Complex (extensive) Patient / Family Education for ongoing care []  - 0 Staff obtains , Records, T Results / Process Orders est []  - 0 Staff telephones HHA, Nursing Homes / Clarify orders / etc []  - 0 Routine Transfer to another Facility (non-emergent condition) []  - 0 Routine Hospital Admission (non-emergent condition) []  - 0 New Admissions / / Ordering NPWT Apligraf, etc. , []  - 0 Emergency Hospital Admission (emergent condition) X- 1 10 Simple Discharge Coordination []  - 0 Complex (extensive) Discharge Coordination PROCESS - Special Needs []  - 0 Pediatric / Minor Patient Management []  - 0 Isolation Patient Management []  - 0 Hearing / Language / Visual special needs []  - 0 Assessment of Community assistance (transportation, D/C planning, etc.) []  - 0 Additional assistance / Altered  mentation []  - 0 Support Surface(s) Assessment (bed, cushion, seat, etc.) INTERVENTIONS - Wound Cleansing / Measurement []  - 0 Simple Wound Cleansing - one wound []  -  0 Complex Wound Cleansing - multiple wounds X- 1 5 Wound Imaging (photographs - any number of wounds) []  - 0 Wound Tracing (instead of photographs) []  - 0 Simple Wound Measurement - one wound []  - 0 Complex Wound Measurement - multiple wounds INTERVENTIONS - Wound Dressings X - Small Wound Dressing one or multiple wounds 1 10 []  - 0 Medium Wound Dressing one or multiple wounds []  - 0 Large Wound Dressing one or multiple wounds []  - 0 Application of Medications - topical []  - 0 Application of Medications - injection INTERVENTIONS - Miscellaneous []  - 0 External ear exam []  - 0 Specimen Collection (cultures, biopsies, blood, body fluids, etc.) []  - 0 Specimen(s) / Culture(s) sent or taken to Lab for analysis []  - 0 Patient Transfer (multiple staff / / Similar devices) []  - 0 Simple Staple / Suture removal (25 or less) []  - 0 Complex Staple / Suture removal (26 or more) []  - 0 Hypo / Hyperglycemic Management (close monitor of Blood Glucose) []  - 0 Ankle / Brachial Index (ABI) - do not check if billed separately []  - 0 Vital Signs Has the patient been seen at the hospital within the last three years: Yes Total Score: 65 Level Of Care: New/Established - Level 2 Electronic Signature(s) Signed: 01/25/2022 4:36:00 PM By: Entered By: on 01/25/2022 15:09:34 -------------------------------------------------------------------------------- Encounter Discharge Information Details Patient Name: Date of Service: Laurie Hunter, Laurie RIA D. 01/25/2022 2:45 PM Medical Record Number: Patient Account Number: Date of Birth/Sex: Treating RN: 12-23-1945 (76 y.o. Primary Care Juana Haralson: Nurse, adult Other Clinician: Referring Percival Glasheen: Treating  Jose Alleyne/Extender: in Treatment: 19 Encounter Discharge Information Items Discharge Condition: Stable Ambulatory Status: Wheelchair Discharge Destination: Home Transportation: Private Auto Accompanied By: friend Schedule Follow-up Appointment: No Clinical Summary of Care: Provided on 01/25/2022 Form Type Recipient Paper Patient Patient Electronic Signature(s) Signed: 01/25/2022 4:36:00 PM By: Entered By: 01/27/2022 on 01/25/2022 15:14:56 -------------------------------------------------------------------------------- Lower Extremity Assessment Details Patient Name: Date of Service: Laurie Hunter, Laurie RIA D. 01/25/2022 2:45 PM Medical Record Number: 01/27/2022 Patient Account Number: 01/27/2022 Date of Birth/Sex: Treating RN: [birth year] (76 y.o. 12/24/1945 Primary Care Takeysha Bonk: 73 Other Clinician: Referring Mckenley Birenbaum: Treating Breindy Meadow/Extender: [name] Weeks in Treatment: 19 Electronic Signature(s) Signed: 01/25/2022 4:36:00 PM By: Adline Mango Entered By: 20 on 01/25/2022 14:54:25 -------------------------------------------------------------------------------- Multi Wound Chart Details Patient Name: Date of Service: Laurie Hunter, Laurie RIA D. 01/25/2022 2:45 PM Medical Record Number: Antonieta Iba Patient Account Number: Antonieta Iba Date of Birth/Sex: Treating RN: [birth year] (76 y.o. [id number] Primary Care Pratyush Ammon: [id number] Other Clinician: Referring Shaconda Hajduk: Treating Iyania Denne/Extender: 12/24/1945 in Treatment: 19 Vital Signs Height(in): 58 Pulse(bpm): 102 Weight(lbs): Blood Pressure(mmHg): 131/81 Body Mass Index(BMI): Temperature(F): 99.8 Respiratory Rate(breaths/min): 20 Photos: [N/A:N/A] Left, Posterior Lower Leg N/A N/A Wound Location: Shear/Friction N/A N/A Wounding Event: Venous Leg Ulcer N/A N/A Primary  Etiology: Cataracts, Anemia, Lymphedema, N/A N/A Comorbid History: Hypertension, Type II Diabetes, Osteoarthritis 08/16/2020 N/A N/A Date Acquired: 14 N/A N/A Weeks of Treatment: Healed - Epithelialized N/A N/A Wound Status: No N/A N/A Wound Recurrence: 0x0x0 N/A N/A Measurements L x W x D (cm) 0 N/A N/A A (cm) : rea 0 N/A N/A Volume (cm) : 100.00% N/A N/A % Reduction in Area: 100.00% N/A N/A % Reduction in Volume: Full Thickness Without Exposed N/A N/A Classification: Support Structures Treatment Notes Electronic  Signature(s) Signed: 01/25/2022 3:21:49 PM By: Geralyn Corwin DO Signed: 01/25/2022 4:36:00 PM By: Antonieta Iba Entered By: Geralyn Corwin on 01/25/2022 15:08:11 -------------------------------------------------------------------------------- Multi-Disciplinary Care Plan Details Patient Name: Date of Service: Laurie Hunter, Laurie RIA D. 01/25/2022 2:45 PM Medical Record Number: [id number] Patient Account Number: [id number] Date of Birth/Sex: Treating RN: 1946-01-04 (76 y.o. [name] Primary Care Sacha Radloff: Georgann Housekeeper Other Clinician: Referring Ahtziri Jeffries: Treating Valeen Borys/Extender: Orvan Seen Weeks in Treatment: 51 Active Inactive Electronic Signature(s) Signed: 01/25/2022 4:36:00 PM By: Antonieta Iba Entered By: Antonieta Iba on 01/25/2022 15:19:14 -------------------------------------------------------------------------------- Pain Assessment Details Patient Name: Date of Service: Laurie Hunter, Laurie RIA D. 01/25/2022 2:45 PM Medical Record Number: [id number] Patient Account Number: [id number] Date of Birth/Sex: Treating RN: 12-11-1945 (76 y.o. [name] Primary Care Zuleyka Kloc: Georgann Housekeeper Other Clinician: Referring Marlin Brys: Treating Keishla Oyer/Extender: Adline Mango in Treatment: 19 Active Problems Location of Pain Severity and Description of Pain Patient Has Paino No Site  Locations Pain Management and Medication Current Pain Management: Electronic Signature(s) Signed: 01/25/2022 4:36:00 PM By: Antonieta Iba Entered By: Antonieta Iba on 01/25/2022 14:54:20 -------------------------------------------------------------------------------- Patient/Caregiver Education Details Patient Name: Date of Service: Laurie Hunter, Laurie RIA D. 7/13/2023andnbsp2:45 PM Medical Record Number: [id number] Patient Account Number: [id number] Date of Birth/Gender: Treating RN: 1945-08-18 (76 y.o. [name] Primary Care Physician: Georgann Housekeeper Other Clinician: Referring Physician: Treating Physician/Extender: Adline Mango in Treatment: 74 Education Assessment Education Provided To: Patient Education Topics Provided Wound/Skin Impairment: Methods: Demonstration, Explain/Verbal, Printed Responses: State content correctly Electronic Signature(s) Signed: 01/25/2022 4:36:00 PM By: Antonieta Iba Entered By: Antonieta Iba on 01/25/2022 14:49:07 -------------------------------------------------------------------------------- Wound Assessment Details Patient Name: Date of Service: Laurie Hunter, Laurie RIA D. 01/25/2022 2:45 PM Medical Record Number: [id number] Patient Account Number: [id number] Date of Birth/Sex: Treating RN: [birth year] (76 y.o. [name] Primary Care Jazlyn Tippens: Georgann Housekeeper Other Clinician: Referring Taelor Moncada: Treating Taner Rzepka/Extender: Orvan Seen Weeks in Treatment: 19 Wound Status Wound Number: 1 Primary Venous Leg Ulcer Etiology: Wound Location: Left, Posterior Lower Leg Wound Healed - Epithelialized Wounding Event: Shear/Friction Status: Date Acquired: 08/16/2020 Comorbid Cataracts, Anemia, Lymphedema, Hypertension, Type II Weeks Of Treatment: 19 History: Diabetes, Osteoarthritis Clustered Wound: No Photos Wound Measurements Length: (cm) Width: (cm) Depth: (cm) Area: (cm) Volume:  (cm) 0 % Reduction in Area: 100% 0 % Reduction in Volume: 100% 0 0 0 Wound Description Classification: Full Thickness Without Exposed Support Structur es Electronic Signature(s) Signed: 01/25/2022 4:36:00 PM By: Antonieta Iba Entered By: Antonieta Iba on 01/25/2022 15:05:49 -------------------------------------------------------------------------------- Vitals Details Patient Name: Date of Service: Laurie Hunter, Laurie RIA D. 01/25/2022 2:45 PM Medical Record Number: [id number] Patient Account Number: [id number] Date of Birth/Sex: Treating RN: [birth year] (76 y.o. [name] Primary Care Darcy Cordner: Georgann Housekeeper Other Clinician: Referring Raynard Mapps: Treating Shaneece Stockburger/Extender: Adline Mango in Treatment: 19 Vital Signs Time Taken: 14:52 Temperature (F): 99.8 Height (in): 58 Pulse (bpm): 102 Respiratory Rate (breaths/min): 20 Blood Pressure (mmHg): 131/81 Reference Range: 80 - 120 mg / dl Electronic Signature(s) Signed: 01/25/2022 4:36:00 PM By: Antonieta Iba Entered By: Antonieta Iba on 01/25/2022 14:54:15

## 2022-02-02 DIAGNOSIS — M21372 Foot drop, left foot: Secondary | ICD-10-CM | POA: Diagnosis not present

## 2022-03-15 DIAGNOSIS — E1122 Type 2 diabetes mellitus with diabetic chronic kidney disease: Secondary | ICD-10-CM | POA: Diagnosis not present

## 2022-03-15 DIAGNOSIS — M810 Age-related osteoporosis without current pathological fracture: Secondary | ICD-10-CM | POA: Diagnosis not present

## 2022-03-15 DIAGNOSIS — I10 Essential (primary) hypertension: Secondary | ICD-10-CM | POA: Diagnosis not present

## 2022-03-15 DIAGNOSIS — N1831 Chronic kidney disease, stage 3a: Secondary | ICD-10-CM | POA: Diagnosis not present

## 2022-03-26 DIAGNOSIS — I10 Essential (primary) hypertension: Secondary | ICD-10-CM | POA: Diagnosis not present

## 2022-03-26 DIAGNOSIS — E782 Mixed hyperlipidemia: Secondary | ICD-10-CM | POA: Diagnosis not present

## 2022-03-26 DIAGNOSIS — N1831 Chronic kidney disease, stage 3a: Secondary | ICD-10-CM | POA: Diagnosis not present

## 2022-03-26 DIAGNOSIS — E1122 Type 2 diabetes mellitus with diabetic chronic kidney disease: Secondary | ICD-10-CM | POA: Diagnosis not present

## 2022-04-04 DIAGNOSIS — Z23 Encounter for immunization: Secondary | ICD-10-CM | POA: Diagnosis not present

## 2022-04-04 DIAGNOSIS — M109 Gout, unspecified: Secondary | ICD-10-CM | POA: Diagnosis not present

## 2022-04-04 DIAGNOSIS — Z1331 Encounter for screening for depression: Secondary | ICD-10-CM | POA: Diagnosis not present

## 2022-04-04 DIAGNOSIS — E782 Mixed hyperlipidemia: Secondary | ICD-10-CM | POA: Diagnosis not present

## 2022-04-04 DIAGNOSIS — I69359 Hemiplegia and hemiparesis following cerebral infarction affecting unspecified side: Secondary | ICD-10-CM | POA: Diagnosis not present

## 2022-04-04 DIAGNOSIS — Z0000 Encounter for general adult medical examination without abnormal findings: Secondary | ICD-10-CM | POA: Diagnosis not present

## 2022-04-04 DIAGNOSIS — E1122 Type 2 diabetes mellitus with diabetic chronic kidney disease: Secondary | ICD-10-CM | POA: Diagnosis not present

## 2022-04-04 DIAGNOSIS — M810 Age-related osteoporosis without current pathological fracture: Secondary | ICD-10-CM | POA: Diagnosis not present

## 2022-04-04 DIAGNOSIS — I10 Essential (primary) hypertension: Secondary | ICD-10-CM | POA: Diagnosis not present

## 2022-04-24 DIAGNOSIS — N1831 Chronic kidney disease, stage 3a: Secondary | ICD-10-CM | POA: Diagnosis not present

## 2022-04-24 DIAGNOSIS — E782 Mixed hyperlipidemia: Secondary | ICD-10-CM | POA: Diagnosis not present

## 2022-04-24 DIAGNOSIS — I10 Essential (primary) hypertension: Secondary | ICD-10-CM | POA: Diagnosis not present

## 2022-04-24 DIAGNOSIS — E1122 Type 2 diabetes mellitus with diabetic chronic kidney disease: Secondary | ICD-10-CM | POA: Diagnosis not present

## 2022-05-24 DIAGNOSIS — E1122 Type 2 diabetes mellitus with diabetic chronic kidney disease: Secondary | ICD-10-CM | POA: Diagnosis not present

## 2022-05-24 DIAGNOSIS — E782 Mixed hyperlipidemia: Secondary | ICD-10-CM | POA: Diagnosis not present

## 2022-05-24 DIAGNOSIS — I10 Essential (primary) hypertension: Secondary | ICD-10-CM | POA: Diagnosis not present

## 2022-05-24 DIAGNOSIS — N1831 Chronic kidney disease, stage 3a: Secondary | ICD-10-CM | POA: Diagnosis not present

## 2022-08-06 DIAGNOSIS — N1831 Chronic kidney disease, stage 3a: Secondary | ICD-10-CM | POA: Diagnosis not present

## 2022-08-06 DIAGNOSIS — E782 Mixed hyperlipidemia: Secondary | ICD-10-CM | POA: Diagnosis not present

## 2022-08-06 DIAGNOSIS — E1122 Type 2 diabetes mellitus with diabetic chronic kidney disease: Secondary | ICD-10-CM | POA: Diagnosis not present

## 2022-08-06 DIAGNOSIS — I10 Essential (primary) hypertension: Secondary | ICD-10-CM | POA: Diagnosis not present

## 2022-08-23 DIAGNOSIS — E782 Mixed hyperlipidemia: Secondary | ICD-10-CM | POA: Diagnosis not present

## 2022-08-23 DIAGNOSIS — I10 Essential (primary) hypertension: Secondary | ICD-10-CM | POA: Diagnosis not present

## 2022-08-23 DIAGNOSIS — E1122 Type 2 diabetes mellitus with diabetic chronic kidney disease: Secondary | ICD-10-CM | POA: Diagnosis not present

## 2022-08-23 DIAGNOSIS — N1831 Chronic kidney disease, stage 3a: Secondary | ICD-10-CM | POA: Diagnosis not present

## 2022-10-11 DIAGNOSIS — N1831 Chronic kidney disease, stage 3a: Secondary | ICD-10-CM | POA: Diagnosis not present

## 2022-10-11 DIAGNOSIS — I69354 Hemiplegia and hemiparesis following cerebral infarction affecting left non-dominant side: Secondary | ICD-10-CM | POA: Diagnosis not present

## 2022-10-11 DIAGNOSIS — E1122 Type 2 diabetes mellitus with diabetic chronic kidney disease: Secondary | ICD-10-CM | POA: Diagnosis not present

## 2022-10-12 DIAGNOSIS — I10 Essential (primary) hypertension: Secondary | ICD-10-CM | POA: Diagnosis not present

## 2022-10-12 DIAGNOSIS — E1122 Type 2 diabetes mellitus with diabetic chronic kidney disease: Secondary | ICD-10-CM | POA: Diagnosis not present

## 2022-10-12 DIAGNOSIS — N1831 Chronic kidney disease, stage 3a: Secondary | ICD-10-CM | POA: Diagnosis not present

## 2022-10-12 DIAGNOSIS — E782 Mixed hyperlipidemia: Secondary | ICD-10-CM | POA: Diagnosis not present

## 2022-11-19 DIAGNOSIS — H25813 Combined forms of age-related cataract, bilateral: Secondary | ICD-10-CM | POA: Diagnosis not present

## 2022-11-19 DIAGNOSIS — H04123 Dry eye syndrome of bilateral lacrimal glands: Secondary | ICD-10-CM | POA: Diagnosis not present

## 2022-11-19 DIAGNOSIS — E119 Type 2 diabetes mellitus without complications: Secondary | ICD-10-CM | POA: Diagnosis not present

## 2022-11-19 DIAGNOSIS — H40013 Open angle with borderline findings, low risk, bilateral: Secondary | ICD-10-CM | POA: Diagnosis not present

## 2023-01-22 DIAGNOSIS — H25811 Combined forms of age-related cataract, right eye: Secondary | ICD-10-CM | POA: Diagnosis not present

## 2023-01-22 DIAGNOSIS — H25812 Combined forms of age-related cataract, left eye: Secondary | ICD-10-CM | POA: Diagnosis not present

## 2023-01-24 DIAGNOSIS — M25511 Pain in right shoulder: Secondary | ICD-10-CM | POA: Diagnosis not present

## 2023-02-14 DIAGNOSIS — H25812 Combined forms of age-related cataract, left eye: Secondary | ICD-10-CM | POA: Diagnosis not present

## 2023-02-18 IMAGING — MG MM DIGITAL SCREENING BILAT W/ TOMO AND CAD
8 series · 8 of 24 positions shown · non-contrast
Comparison: Previous exam(s).

CLINICAL DATA: Screening.

EXAM:
DIGITAL SCREENING BILATERAL MAMMOGRAM WITH TOMOSYNTHESIS AND CAD
TECHNIQUE: Bilateral screening digital craniocaudal and mediolateral oblique
mammograms were obtained. Bilateral screening digital breast
tomosynthesis was performed. The images were evaluated with
computer-aided detection.

[L CC synth-2D]
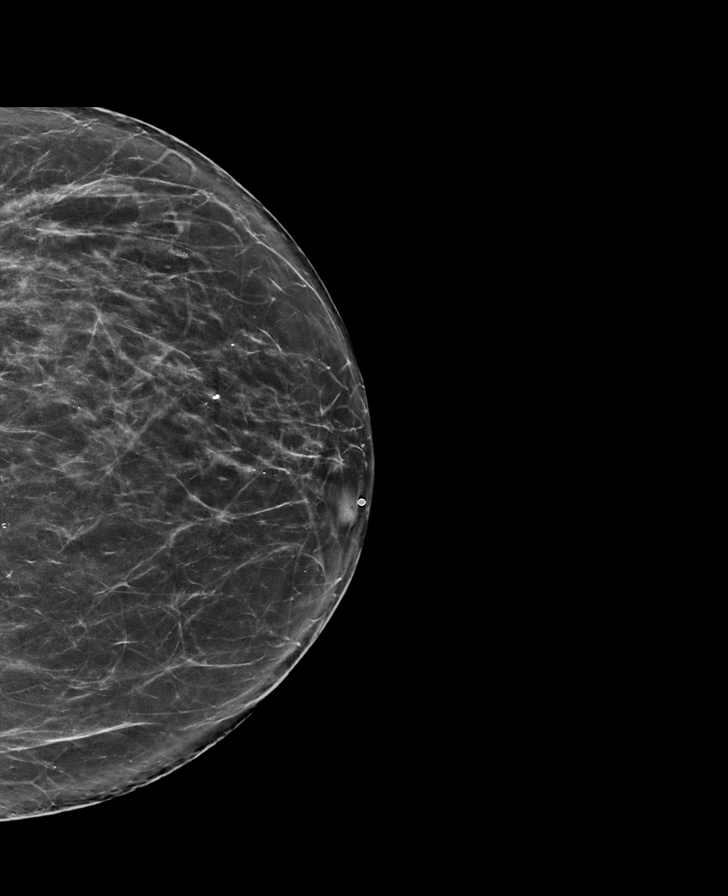

[L MLO synth-2D]
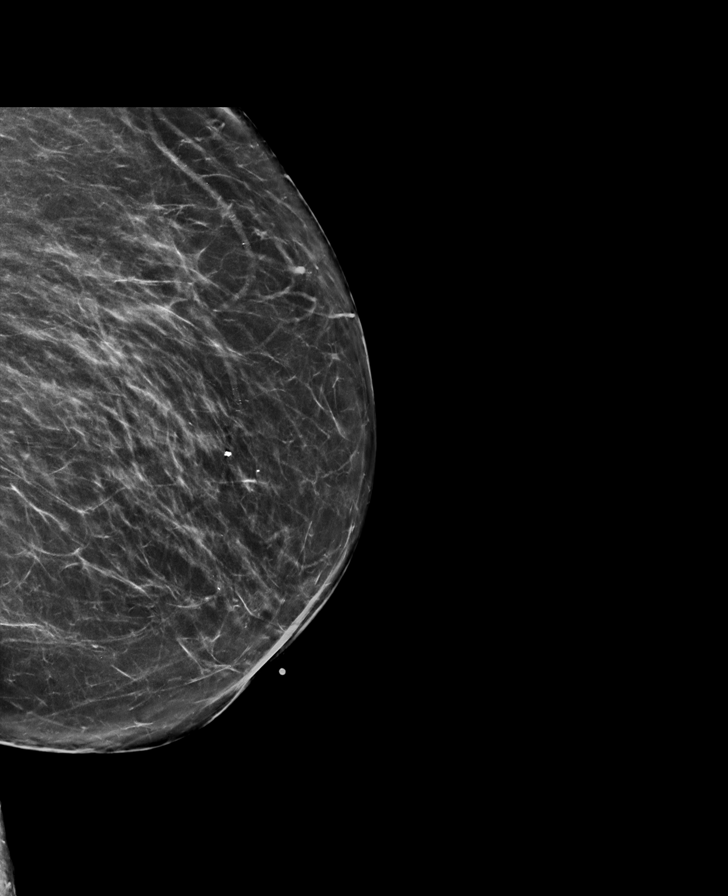

[R MLO synth-2D]
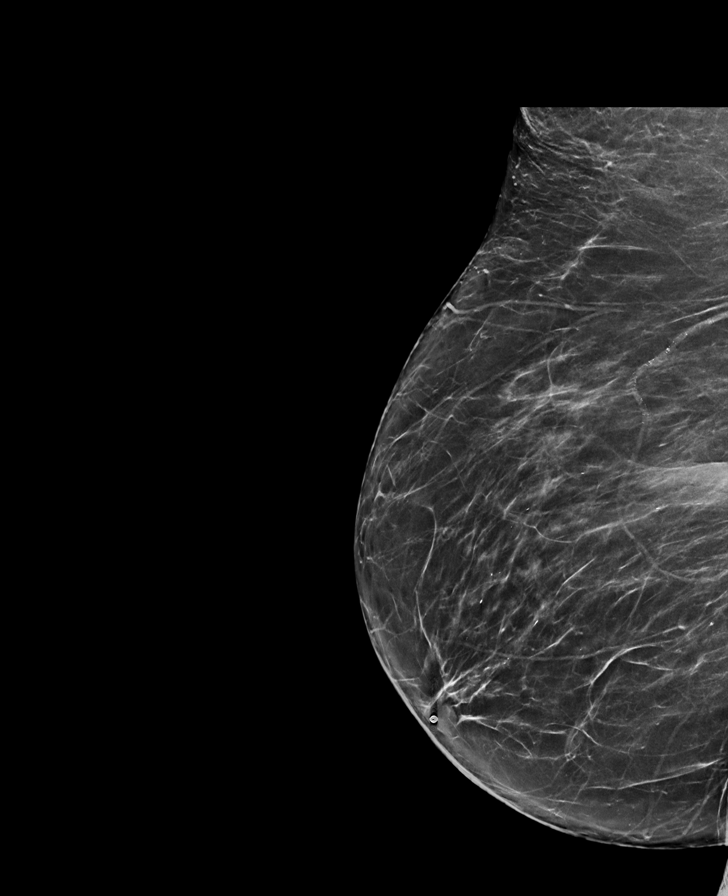

[R CC synth-2D]
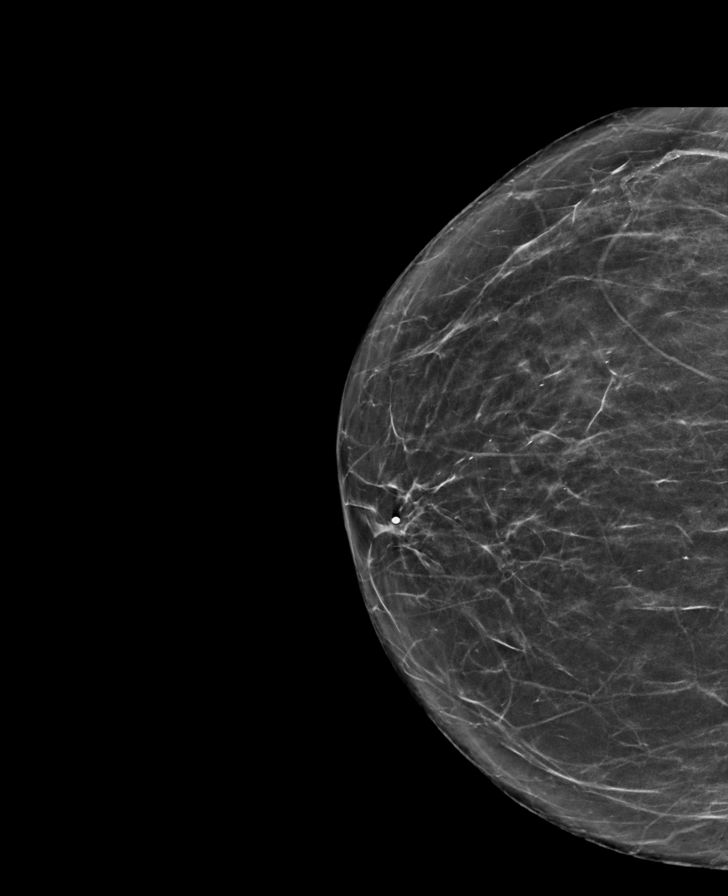

[L CC tomo · tomo slice 39/77.0]
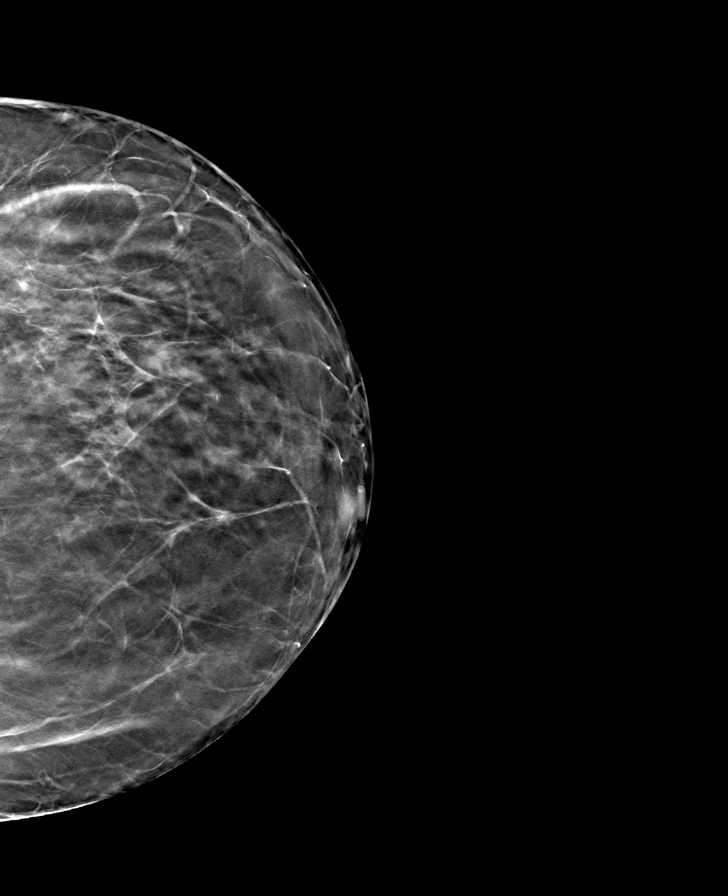

[R CC tomo · tomo slice 35/68.0]
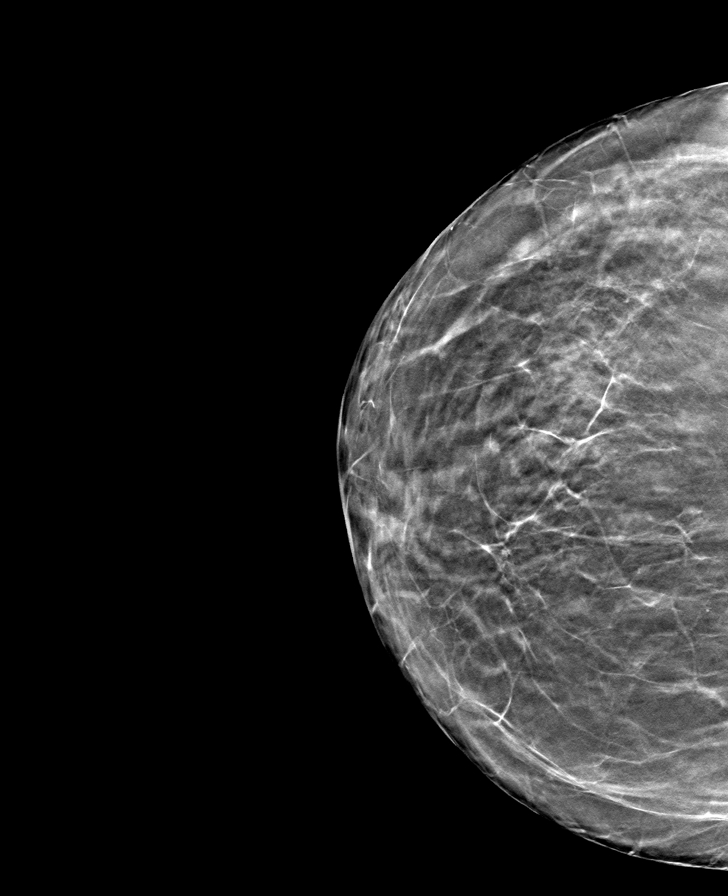

[R MLO tomo · tomo slice 39/76.0]
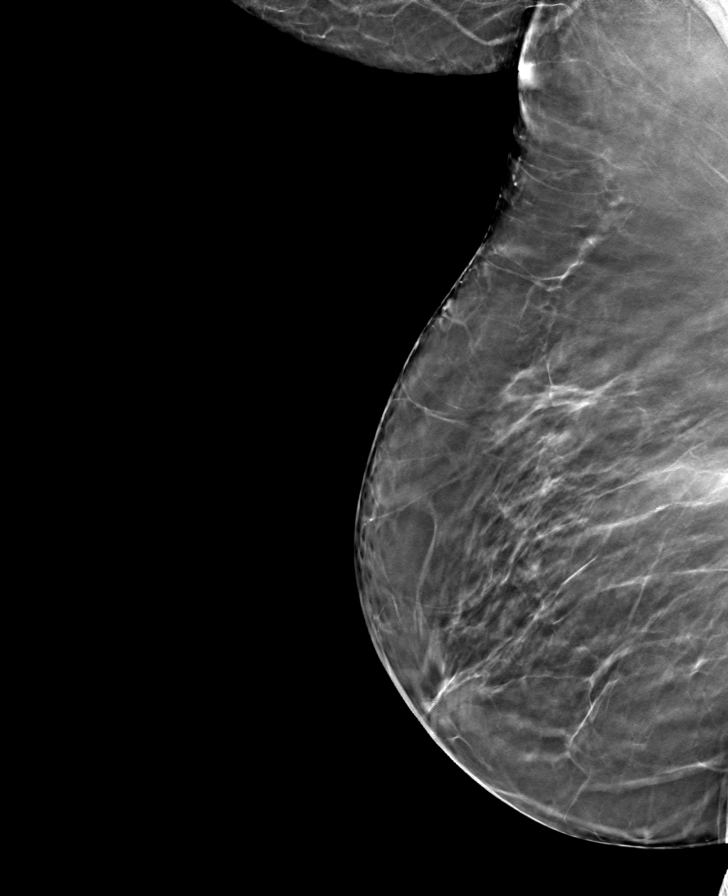

[L MLO tomo · tomo slice 41/80.0]
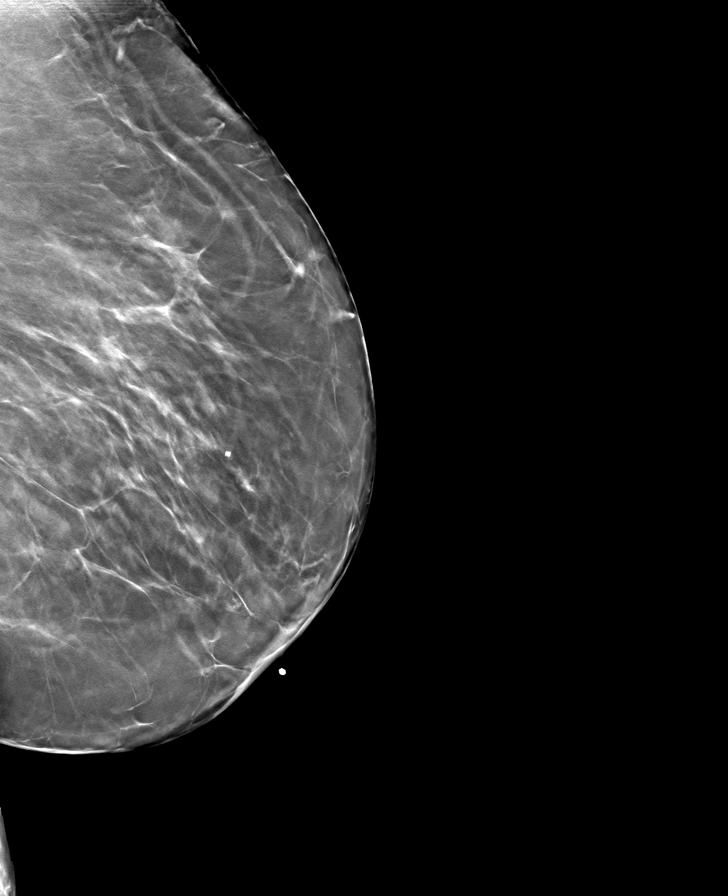

[8 of 24 positions shown; findings below may reference images not displayed]

ACR Breast Density Category b: There are scattered areas of
fibroglandular density.
FINDINGS: There are no findings suspicious for malignancy.
IMPRESSION: No mammographic evidence of malignancy. A result letter of this
screening mammogram will be mailed directly to the patient.

RECOMMENDATION:
Screening mammogram in one year. (Code:51-O-LD2)

BI-RADS CATEGORY  1: Negative.

## 2023-05-31 DIAGNOSIS — Z7982 Long term (current) use of aspirin: Secondary | ICD-10-CM | POA: Diagnosis not present

## 2023-05-31 DIAGNOSIS — Z6841 Body Mass Index (BMI) 40.0 and over, adult: Secondary | ICD-10-CM | POA: Diagnosis not present

## 2023-05-31 DIAGNOSIS — I130 Hypertensive heart and chronic kidney disease with heart failure and stage 1 through stage 4 chronic kidney disease, or unspecified chronic kidney disease: Secondary | ICD-10-CM | POA: Diagnosis not present

## 2023-09-02 DIAGNOSIS — K921 Melena: Secondary | ICD-10-CM | POA: Diagnosis not present

## 2023-09-02 DIAGNOSIS — Z23 Encounter for immunization: Secondary | ICD-10-CM | POA: Diagnosis not present

## 2023-10-28 ENCOUNTER — Encounter (HOSPITAL_COMMUNITY): Payer: Self-pay

## 2023-10-28 ENCOUNTER — Other Ambulatory Visit: Payer: Self-pay

## 2023-10-28 ENCOUNTER — Observation Stay (HOSPITAL_COMMUNITY)

## 2023-10-28 ENCOUNTER — Observation Stay (HOSPITAL_COMMUNITY)
Admission: EM | Admit: 2023-10-28 | Discharge: 2023-11-01 | Disposition: A | Discharge status: Skilled Nursing Facility | Attending: Internal Medicine | Admitting: Internal Medicine

## 2023-10-28 DIAGNOSIS — M109 Gout, unspecified: Secondary | ICD-10-CM | POA: Insufficient documentation

## 2023-10-28 DIAGNOSIS — D509 Iron deficiency anemia, unspecified: Secondary | ICD-10-CM | POA: Insufficient documentation

## 2023-10-28 DIAGNOSIS — N179 Acute kidney failure, unspecified: Secondary | ICD-10-CM | POA: Diagnosis not present

## 2023-10-28 DIAGNOSIS — R0689 Other abnormalities of breathing: Secondary | ICD-10-CM | POA: Diagnosis not present

## 2023-10-28 DIAGNOSIS — R531 Weakness: Secondary | ICD-10-CM | POA: Diagnosis not present

## 2023-10-28 DIAGNOSIS — E785 Hyperlipidemia, unspecified: Secondary | ICD-10-CM | POA: Insufficient documentation

## 2023-10-28 DIAGNOSIS — Z7984 Long term (current) use of oral hypoglycemic drugs: Secondary | ICD-10-CM | POA: Diagnosis not present

## 2023-10-28 DIAGNOSIS — R42 Dizziness and giddiness: Secondary | ICD-10-CM | POA: Diagnosis not present

## 2023-10-28 DIAGNOSIS — Z7902 Long term (current) use of antithrombotics/antiplatelets: Secondary | ICD-10-CM | POA: Insufficient documentation

## 2023-10-28 DIAGNOSIS — D631 Anemia in chronic kidney disease: Secondary | ICD-10-CM | POA: Insufficient documentation

## 2023-10-28 DIAGNOSIS — E1122 Type 2 diabetes mellitus with diabetic chronic kidney disease: Secondary | ICD-10-CM | POA: Insufficient documentation

## 2023-10-28 DIAGNOSIS — L97929 Non-pressure chronic ulcer of unspecified part of left lower leg with unspecified severity: Secondary | ICD-10-CM | POA: Insufficient documentation

## 2023-10-28 DIAGNOSIS — I639 Cerebral infarction, unspecified: Secondary | ICD-10-CM | POA: Insufficient documentation

## 2023-10-28 DIAGNOSIS — Z79899 Other long term (current) drug therapy: Secondary | ICD-10-CM | POA: Diagnosis not present

## 2023-10-28 DIAGNOSIS — R262 Difficulty in walking, not elsewhere classified: Secondary | ICD-10-CM | POA: Diagnosis not present

## 2023-10-28 DIAGNOSIS — D638 Anemia in other chronic diseases classified elsewhere: Secondary | ICD-10-CM | POA: Diagnosis not present

## 2023-10-28 DIAGNOSIS — N189 Chronic kidney disease, unspecified: Secondary | ICD-10-CM

## 2023-10-28 DIAGNOSIS — M1000 Idiopathic gout, unspecified site: Secondary | ICD-10-CM

## 2023-10-28 DIAGNOSIS — R7401 Elevation of levels of liver transaminase levels: Secondary | ICD-10-CM | POA: Diagnosis not present

## 2023-10-28 DIAGNOSIS — L98491 Non-pressure chronic ulcer of skin of other sites limited to breakdown of skin: Secondary | ICD-10-CM

## 2023-10-28 DIAGNOSIS — R197 Diarrhea, unspecified: Secondary | ICD-10-CM | POA: Diagnosis not present

## 2023-10-28 DIAGNOSIS — L8990 Pressure ulcer of unspecified site, unspecified stage: Secondary | ICD-10-CM | POA: Insufficient documentation

## 2023-10-28 DIAGNOSIS — E876 Hypokalemia: Secondary | ICD-10-CM | POA: Diagnosis not present

## 2023-10-28 DIAGNOSIS — I129 Hypertensive chronic kidney disease with stage 1 through stage 4 chronic kidney disease, or unspecified chronic kidney disease: Secondary | ICD-10-CM | POA: Diagnosis not present

## 2023-10-28 DIAGNOSIS — E119 Type 2 diabetes mellitus without complications: Secondary | ICD-10-CM

## 2023-10-28 DIAGNOSIS — N1832 Chronic kidney disease, stage 3b: Secondary | ICD-10-CM | POA: Diagnosis not present

## 2023-10-28 DIAGNOSIS — Z6841 Body Mass Index (BMI) 40.0 and over, adult: Secondary | ICD-10-CM

## 2023-10-28 DIAGNOSIS — Z7982 Long term (current) use of aspirin: Secondary | ICD-10-CM | POA: Diagnosis not present

## 2023-10-28 DIAGNOSIS — Z8673 Personal history of transient ischemic attack (TIA), and cerebral infarction without residual deficits: Secondary | ICD-10-CM | POA: Diagnosis not present

## 2023-10-28 DIAGNOSIS — I890 Lymphedema, not elsewhere classified: Secondary | ICD-10-CM | POA: Diagnosis not present

## 2023-10-28 DIAGNOSIS — N281 Cyst of kidney, acquired: Secondary | ICD-10-CM | POA: Diagnosis not present

## 2023-10-28 LAB — CBC
HCT: 37.4 % (ref 36.0–46.0)
Hemoglobin: 12.3 g/dL (ref 12.0–15.0)
MCH: 25.4 pg — ABNORMAL LOW (ref 26.0–34.0)
MCHC: 32.9 g/dL (ref 30.0–36.0)
MCV: 77.3 fL — ABNORMAL LOW (ref 80.0–100.0)
Platelets: 162 10*3/uL (ref 150–400)
RBC: 4.84 MIL/uL (ref 3.87–5.11)
RDW: 22 % — ABNORMAL HIGH (ref 11.5–15.5)
WBC: 5.9 10*3/uL (ref 4.0–10.5)
nRBC: 0 % (ref 0.0–0.2)

## 2023-10-28 LAB — COMPREHENSIVE METABOLIC PANEL WITH GFR
ALT: 83 U/L — ABNORMAL HIGH (ref 0–44)
AST: 92 U/L — ABNORMAL HIGH (ref 15–41)
Albumin: 3.9 g/dL (ref 3.5–5.0)
Alkaline Phosphatase: 124 U/L (ref 38–126)
Anion gap: 12 (ref 5–15)
BUN: 27 mg/dL — ABNORMAL HIGH (ref 8–23)
CO2: 24 mmol/L (ref 22–32)
Calcium: 10 mg/dL (ref 8.9–10.3)
Chloride: 102 mmol/L (ref 98–111)
Creatinine, Ser: 2.13 mg/dL — ABNORMAL HIGH (ref 0.44–1.00)
GFR, Estimated: 23 mL/min — ABNORMAL LOW (ref >60)
Glucose, Bld: 86 mg/dL (ref 70–99)
Potassium: 4.7 mmol/L (ref 3.5–5.1)
Sodium: 138 mmol/L (ref 135–145)
Total Bilirubin: 1.1 mg/dL (ref 0.0–1.2)
Total Protein: 7.9 g/dL (ref 6.5–8.1)

## 2023-10-28 LAB — TYPE AND SCREEN
ABO/RH(D): A POS
Antibody Screen: NEGATIVE

## 2023-10-28 LAB — GLUCOSE, CAPILLARY: Glucose-Capillary: 110 mg/dL — ABNORMAL HIGH (ref 70–99)

## 2023-10-28 LAB — POC OCCULT BLOOD, ED: Fecal Occult Bld: NEGATIVE

## 2023-10-28 LAB — HEPATITIS PANEL, ACUTE
HCV Ab: NONREACTIVE
Hep A IgM: NONREACTIVE
Hep B C IgM: NONREACTIVE
Hepatitis B Surface Ag: NONREACTIVE

## 2023-10-28 MED ORDER — SODIUM CHLORIDE 0.9% FLUSH: 3.0000 mL | INTRAVENOUS | Status: DC | PRN

## 2023-10-28 MED ORDER — FAMOTIDINE 20 MG PO TABS
20.0000 mg | ORAL_TABLET | Freq: Two times a day (BID) | ORAL | Status: DC
  Administered 2023-10-28 – 2023-10-29 (×2): 20 mg via ORAL
  Filled 2023-10-28 (×2): qty 1

## 2023-10-28 MED ORDER — SODIUM CHLORIDE 0.9 % IV BOLUS
1000.0000 mL | Freq: Once | INTRAVENOUS | Status: AC
  Administered 2023-10-28: 1000 mL via INTRAVENOUS

## 2023-10-28 MED ORDER — GLIPIZIDE 5 MG PO TABS
5.0000 mg | ORAL_TABLET | Freq: Every day | ORAL | Status: DC
  Administered 2023-10-29: 5 mg via ORAL
  Filled 2023-10-28: qty 1

## 2023-10-28 MED ORDER — CLOPIDOGREL BISULFATE 75 MG PO TABS
75.0000 mg | ORAL_TABLET | Freq: Every day | ORAL | Status: DC
  Administered 2023-10-28 – 2023-11-01 (×5): 75 mg via ORAL
  Filled 2023-10-28 (×5): qty 1

## 2023-10-28 MED ORDER — ONDANSETRON HCL 4 MG/2ML IJ SOLN: 4.0000 mg | Freq: Four times a day (QID) | INTRAMUSCULAR | Status: DC | PRN

## 2023-10-28 MED ORDER — ALLOPURINOL 100 MG PO TABS: 50.0000 mg | ORAL_TABLET | ORAL | Status: DC

## 2023-10-28 MED ORDER — HEPARIN SODIUM (PORCINE) 5000 UNIT/ML IJ SOLN
5000.0000 [IU] | Freq: Three times a day (TID) | INTRAMUSCULAR | Status: DC
  Administered 2023-10-28 – 2023-11-01 (×12): 5000 [IU] via SUBCUTANEOUS
  Filled 2023-10-28 (×10): qty 1

## 2023-10-28 MED ORDER — ACETAMINOPHEN 650 MG RE SUPP: 650.0000 mg | Freq: Four times a day (QID) | RECTAL | Status: DC | PRN

## 2023-10-28 MED ORDER — ORAL CARE MOUTH RINSE: 15.0000 mL | OROMUCOSAL | Status: DC | PRN

## 2023-10-28 MED ORDER — ATORVASTATIN CALCIUM 10 MG PO TABS
20.0000 mg | ORAL_TABLET | Freq: Every day | ORAL | Status: DC
Start: 2023-10-28 — End: 2023-10-28

## 2023-10-28 MED ORDER — LACTATED RINGERS IV SOLN: INTRAVENOUS | Status: AC

## 2023-10-28 MED ORDER — FERROUS SULFATE 325 (65 FE) MG PO TABS
325.0000 mg | ORAL_TABLET | Freq: Every day | ORAL | Status: DC
  Administered 2023-10-29 – 2023-11-01 (×4): 325 mg via ORAL
  Filled 2023-10-28 (×4): qty 1

## 2023-10-28 MED ORDER — ONDANSETRON HCL 4 MG PO TABS: 4.0000 mg | ORAL_TABLET | Freq: Four times a day (QID) | ORAL | Status: DC | PRN

## 2023-10-28 MED ORDER — ACETAMINOPHEN 325 MG PO TABS
650.0000 mg | ORAL_TABLET | Freq: Four times a day (QID) | ORAL | Status: DC | PRN
  Administered 2023-10-29 – 2023-10-31 (×4): 650 mg via ORAL
  Filled 2023-10-28 (×4): qty 2

## 2023-10-28 MED ORDER — BISMUTH SUBSALICYLATE 262 MG/15ML PO SUSP: 30.0000 mL | ORAL | Status: DC | PRN

## 2023-10-28 MED ORDER — ALLOPURINOL 100 MG PO TABS: 100.0000 mg | ORAL_TABLET | Freq: Every day | ORAL | Status: DC

## 2023-10-28 MED ORDER — SODIUM CHLORIDE 0.9 % IV SOLN: 250.0000 mL | INTRAVENOUS | Status: DC | PRN

## 2023-10-28 MED ORDER — ASPIRIN 81 MG PO TBEC
162.0000 mg | DELAYED_RELEASE_TABLET | Freq: Every day | ORAL | Status: DC
  Administered 2023-10-28 – 2023-11-01 (×5): 162 mg via ORAL
  Filled 2023-10-28 (×5): qty 2

## 2023-10-28 MED ORDER — SODIUM CHLORIDE 0.9% FLUSH
3.0000 mL | Freq: Two times a day (BID) | INTRAVENOUS | Status: DC
  Administered 2023-10-29: 3 mL via INTRAVENOUS

## 2023-10-28 MED ORDER — AMLODIPINE BESYLATE 10 MG PO TABS
10.0000 mg | ORAL_TABLET | Freq: Every day | ORAL | Status: DC
Start: 2023-10-28 — End: 2023-11-01
  Administered 2023-10-28 – 2023-11-01 (×5): 10 mg via ORAL
  Filled 2023-10-28: qty 2
  Filled 2023-10-28: qty 1
  Filled 2023-10-28: qty 2
  Filled 2023-10-28 (×2): qty 1

## 2023-10-28 NOTE — Plan of Care (Signed)
   Problem: Education: Goal: Knowledge of General Education information will improve Description Including pain rating scale, medication(s)/side effects and non-pharmacologic comfort measures Outcome: Progressing

## 2023-10-28 NOTE — ED Provider Notes (Signed)
 [facility] Provider Note   CSN: [id number] Arrival date & time: 10/28/23  1336     History {Add pertinent medical, surgical, social history, OB history to HPI:1} Chief Complaint  Patient presents with   GI Problem    Laurie Hunter is a 78 y.o. female.  She is brought in by ambulance from home with complaint of loose dark stools it has been going on for 2 weeks.  Today she was with go to the doctor's office but she was having trouble ambulating due to weakness dizziness did not feel like her right leg was working as well as it should.  She has prior stroke with left-sided deficits and her right side is her strong side.  Family ultimately called an ambulance to bring her here.  No fevers or chills no abdominal pain nausea vomiting.  She is on iron which she thinks is the cause of her dark stools.  She is not on a blood thinner.  The history is provided by the patient.  Diarrhea Quality:  Watery Severity:  Moderate Onset quality:  Gradual Duration:  2 weeks Timing:  Intermittent Progression:  Unchanged Relieved by:  Nothing Ineffective treatments:  Anti-motility medications Associated symptoms: no abdominal pain, no chills, no recent cough, no fever and no vomiting        Home Medications Prior to Admission medications   Medication Sig Start Date End Date Taking? Authorizing Provider  alendronate (FOSAMAX) 70 MG tablet Take 70 mg by mouth every 7 (seven) days. Friday. Take with a full glass of water on an empty stomach.    [provider]  allopurinol (ZYLOPRIM) 100 MG tablet Take 100 mg by mouth daily.    [provider]  amLODipine (NORVASC) 10 MG tablet Take 10 mg by mouth daily.    [provider]  aspirin 81 MG tablet Take 162 mg by mouth daily.    [provider]  atenolol (TENORMIN) 50 MG tablet Take 25 mg by mouth daily.    [provider]  atorvastatin (LIPITOR) 20 MG tablet Take  20 mg by mouth at bedtime. 03/08/20   [provider]  clopidogrel (PLAVIX) 75 MG tablet Take 75 mg by mouth daily.    [provider]  famotidine (PEPCID) 20 MG tablet Take 1 tablet (20 mg total) by mouth 2 (two) times daily. 10/17/12   Early Glisson, MD  FEROSUL 325 (65 Fe) MG tablet SMARTSIG:1 Tablet(s) By Mouth Every Evening 03/08/20   [provider]  glipiZIDE (GLUCOTROL) 5 MG tablet Take 5 mg by mouth daily.    [provider]  hydrochlorothiazide (HYDRODIURIL) 25 MG tablet Take 25 mg by mouth daily.    [provider]  losartan (COZAAR) 25 MG tablet Take 25 mg by mouth every morning. 03/07/20   [provider]  metFORMIN (GLUCOPHAGE) 500 MG tablet Take 500 mg by mouth 2 (two) times daily with a meal.    [provider]  potassium chloride (KLOR-CON) 10 MEQ tablet Take 10 mEq by mouth every morning. 03/07/20   [provider]  pravastatin (PRAVACHOL) 40 MG tablet Take 40 mg by mouth daily.    [provider]  predniSONE (DELTASONE) 20 MG tablet Take 2 tablets (40 mg total) by mouth daily. 10/17/12   Early Glisson, MD      Allergies    Erythromycin, Lisinopril, and Penicillins    Review of Systems   Review of Systems  Constitutional:  Negative for chills and fever.  Gastrointestinal:  Positive for diarrhea. Negative for abdominal pain and vomiting.    Physical Exam Updated Vital Signs BP (!) 123/59   Pulse 65   Temp 98.5 F (36.9 C) (Oral)   Resp 16   Ht 4' (1.219 m)   Wt 108.9 kg   SpO2 100%   BMI 73.24 kg/m  Physical Exam Vitals and nursing note reviewed.  Constitutional:      General: She is not in acute distress.    Appearance: Normal appearance. She is well-developed.  HENT:     Head: Normocephalic and atraumatic.  Eyes:     Conjunctiva/sclera: Conjunctivae normal.  Cardiovascular:     Rate and Rhythm: Normal rate and regular rhythm.     Heart sounds: No murmur heard. Pulmonary:      Effort: Pulmonary effort is normal. No respiratory distress.     Breath sounds: Normal breath sounds.  Abdominal:     Palpations: Abdomen is soft.     Tenderness: There is no abdominal tenderness. There is no guarding or rebound.  Musculoskeletal:        General: No deformity.     Cervical back: Neck supple.  Skin:    General: Skin is warm and dry.     Capillary Refill: Capillary refill takes less than 2 seconds.  Neurological:     Mental Status: She is alert.     ED Results / Procedures / Treatments   Labs (all labs ordered are listed, but only abnormal results are displayed) Labs Reviewed  COMPREHENSIVE METABOLIC PANEL WITH GFR - Abnormal; Notable for the following components:      Result Value   BUN 27 (*)    Creatinine, Ser 2.13 (*)    AST 92 (*)    ALT 83 (*)    GFR, Estimated 23 (*)    All other components within normal limits  CBC - Abnormal; Notable for the following components:   MCV 77.3 (*)    MCH 25.4 (*)    RDW 22.0 (*)    All other components within normal limits  POC OCCULT BLOOD, ED  TYPE AND SCREEN    EKG EKG Interpretation Date/Time:  Monday October 28 2023 14:08:10 EDT Ventricular Rate:  62 PR Interval:  145 QRS Duration:  95 QT Interval:  407 QTC Calculation: 414 R Axis:   26  Text Interpretation: Sinus rhythm Borderline T abnormalities, diffuse leads No significant change since prior 12/11 Confirmed by Racheal Buddle [phone]) on 10/28/2023 2:56:16 PM  Radiology No results found.  Procedures Procedures  {Document cardiac monitor, telemetry assessment procedure when appropriate:1}  Medications Ordered in ED Medications  sodium chloride 0.9 % bolus 1,000 mL (has no administration in time range)    ED Course/ Medical Decision Making/ A&P   {   Click here for ABCD2, HEART and other calculatorsREFRESH Note before signing :1}                              Medical Decision Making Amount and/or Complexity of Data Reviewed Labs:  ordered.   This patient complains of ***; this involves an extensive number of treatment Options and is a complaint that carries with it a high risk of complications and morbidity. The differential includes ***  I ordered, reviewed and interpreted labs, which included *** I ordered medication *** and reviewed PMP when indicated. I ordered imaging studies which included *** and I  independently    visualized and interpreted imaging which showed *** Additional history obtained from *** Previous records obtained and reviewed *** I consulted *** and discussed lab and imaging findings and discussed disposition.  Cardiac monitoring reviewed, *** Social determinants considered, *** Critical Interventions: ***  After the interventions stated above, I reevaluated the patient and found *** Admission and further testing considered, ***   {Document critical care time when appropriate:1} {Document review of labs and clinical decision tools ie heart score, Chads2Vasc2 etc:1}  {Document your independent review of radiology images, and any outside records:1} {Document your discussion with family members, caretakers, and with consultants:1} {Document social determinants of health affecting pt's care:1} {Document your decision making why or why not admission, treatments were needed:1} Final Clinical Impression(s) / ED Diagnoses Final diagnoses:  None    Rx / DC Orders ED Discharge Orders     None

## 2023-10-28 NOTE — H&P (Addendum)
 History and Physical    Laurie Hunter:[id number] DOB: 02-13-1946 DOA: 10/28/2023  PCP: Laurie Mina, MD   Patient coming from: Home   Chief Complaint:  Chief Complaint  Patient presents with   GI Problem   ED TRIAGE note:BIB [facility] from home, patient had a doctors appointment today for increasing diarrhea x 2 weeks, patient family called ems due to patient becoming increasingly weak, generalized dizzines   HPI:  Laurie Hunter is a 78 y.o. female with medical history significant of history of CVA with left-sided residual weakness, non-insulin-dependent DM type II essential hypertension, chronic nonhealing pressure ulcer of the left lower extremity, CKD stage III, anemia of chronic disease, hyperlipidemia, and gout presented emergency department complaining of diarrhea for 2 weeks.  Family reported patient is also becoming more weak and generalized dizziness.  Patient reporting that she has loose stool dark-colored for last 2 weeks.  She today she went to see wound care clinic found to have weakness with ambulation and dizziness and felt like the right leg not working well.  Patient denying any abdominal pain, nausea, vomiting and diarrhea.  She is reporting greenish/dark color to stool which is completely liquid in formation ongoing for last 2 weeks.  Denies any chest pain, shortness of breath, palpitation, fever and chills.   ED Course:  At presentation to ED patient is hemodynamically stable CMP showing elevated creatinine 2.13 and GFR 29.  There is no lab work for last 13 years.  Unsure if patient's renal function has been progressed from CKD 3-4 versus patient developed AKI.  CMP also showing elevated AST/ALT 92/83 normal bilirubin level.  Acute hepatitis panel negative. CBC showing hemoglobin 12.5, hematocrit 37, low MCV 77, normal WBC and platelet count.  Hemoglobin is around 11 and 1013 years ago. FOBT negative. EKG showing normal sinus rhythm heart rate 62.  In the  ED patient has been given 1 L of NS bolus.  ED physician reported that patient is too weak to ambulate and family is not comfortable to take patient to home.  Requesting for admission for further observation for PT and OT evaluation.  And find out the cause of diarrhea.  Patient has normal H&H and FOBT negative there is no concern for GI bleed.  Hospitalist has been consulted for further evaluation management of ongoing diarrhea and AKI on CKD stage IIIb/IV.    Significant labs in the ED: Lab Orders         Gastrointestinal Panel by PCR , Stool         C Difficile Quick Screen w PCR reflex         Comprehensive metabolic panel         CBC         Hepatitis panel, acute         CBC         Comprehensive metabolic panel         Urinalysis, Routine w reflex microscopic -Urine, Clean Catch         Creatinine, urine, random         Sodium, urine, random         Hemoglobin A1c         POC occult blood, ED       Review of Systems:  Review of Systems  Constitutional:  Negative for chills, fever, malaise/fatigue and weight loss.  Respiratory:  Negative for cough, sputum production and shortness of breath.   Cardiovascular:  Negative for chest  pain and palpitations.  Gastrointestinal:  Positive for diarrhea. Negative for abdominal pain, blood in stool, constipation, heartburn, melena, nausea and vomiting.  Genitourinary:  Negative for dysuria and urgency.  Musculoskeletal:  Negative for back pain, joint pain, myalgias and neck pain.  Neurological:  Negative for dizziness and headaches.  Endo/Heme/Allergies:  Does not bruise/bleed easily.  Psychiatric/Behavioral:  Negative for depression and suicidal ideas. The patient is not nervous/anxious.     Past Medical History:  Diagnosis Date   Diabetes mellitus without complication (HCC)    Hypertension    Stroke [facility])     Past Surgical History:  Procedure Laterality Date   ABDOMINAL HYSTERECTOMY     GALLBLADDER SURGERY       reports  that she has never smoked. She does not have any smokeless tobacco history on file. She reports that she does not drink alcohol and does not use drugs.  Allergies  Allergen Reactions   Erythromycin Swelling   Lisinopril Swelling   Penicillins Swelling    History reviewed. No pertinent family history.  Prior to Admission medications   Medication Sig Start Date End Date Taking? Authorizing Provider  alendronate (FOSAMAX) 70 MG tablet Take 70 mg by mouth every 7 (seven) days. Friday. Take with a full glass of water on an empty stomach.    [provider]  allopurinol (ZYLOPRIM) 100 MG tablet Take 100 mg by mouth daily.    [provider]  amLODipine (NORVASC) 10 MG tablet Take 10 mg by mouth daily.    [provider]  aspirin 81 MG tablet Take 162 mg by mouth daily.    [provider]  atenolol (TENORMIN) 50 MG tablet Take 25 mg by mouth daily.    [provider]  atorvastatin (LIPITOR) 20 MG tablet Take 20 mg by mouth at bedtime. 03/08/20   [provider]  clopidogrel (PLAVIX) 75 MG tablet Take 75 mg by mouth daily.    [provider]  famotidine (PEPCID) 20 MG tablet Take 1 tablet (20 mg total) by mouth 2 (two) times daily. 10/17/12   Eber Hong, MD  FEROSUL 325 (65 Fe) MG tablet SMARTSIG:1 Tablet(s) By Mouth Every Evening 03/08/20   [provider]  glipiZIDE (GLUCOTROL) 5 MG tablet Take 5 mg by mouth daily.    [provider]  hydrochlorothiazide (HYDRODIURIL) 25 MG tablet Take 25 mg by mouth daily.    [provider]  losartan (COZAAR) 25 MG tablet Take 25 mg by mouth every morning. 03/07/20   [provider]  metFORMIN (GLUCOPHAGE) 500 MG tablet Take 500 mg by mouth 2 (two) times daily with a meal.    [provider]  potassium chloride (KLOR-CON) 10 MEQ tablet Take 10 mEq by mouth every morning. 03/07/20   [provider]  pravastatin (PRAVACHOL) 40 MG tablet Take 40 mg by  mouth daily.    [provider]  predniSONE (DELTASONE) 20 MG tablet Take 2 tablets (40 mg total) by mouth daily. 10/17/12   Eber Hong, MD     Physical Exam: Vitals:   10/28/23 1356 10/28/23 1400 10/28/23 1730 10/28/23 1738  BP: (!) 120/56 (!) 123/59 (!) 122/58   Pulse: 66 65 74   Resp: (!) 21 16 20    Temp: 98.5 F (36.9 C)   98.7 F (37.1 C)  TempSrc: Oral     SpO2: 99% 100% 99%   Weight: 108.9 kg     Height: 4' (1.219 m)  Physical Exam Vitals and nursing note reviewed.  Constitutional:      Appearance: She is obese. She is not ill-appearing.  HENT:     Mouth/Throat:     Mouth: Mucous membranes are moist.  Eyes:     Pupils: Pupils are equal, round, and reactive to light.  Cardiovascular:     Rate and Rhythm: Normal rate and regular rhythm.     Pulses: Normal pulses.     Heart sounds: Normal heart sounds.  Pulmonary:     Effort: Pulmonary effort is normal.     Breath sounds: Normal breath sounds.  Abdominal:     General: Bowel sounds are normal. There is no distension.     Tenderness: There is no abdominal tenderness. There is no guarding or rebound.  Musculoskeletal:        General: Swelling and deformity present.     Cervical back: Neck supple.     Right lower leg: Edema present.     Left lower leg: Edema present.  Skin:    General: Skin is warm.  Neurological:     Mental Status: She is alert and oriented to person, place, and time. Mental status is at baseline.  Psychiatric:        Mood and Affect: Mood normal.        Behavior: Behavior normal.        Thought Content: Thought content normal.      Labs on Admission: I have personally reviewed following labs and imaging studies  CBC: Recent Labs  Lab 10/28/23 1411  WBC 5.9  HGB 12.3  HCT 37.4  MCV 77.3*  PLT 162   Basic Metabolic Panel: Recent Labs  Lab 10/28/23 1411  NA 138  K 4.7  CL 102  CO2 24  GLUCOSE 86  BUN 27*  CREATININE 2.13*  CALCIUM 10.0   GFR: Estimated  Creatinine Clearance: 18.7 mL/min (A) (by C-G formula based on SCr of 2.13 mg/dL (H)). Liver Function Tests: Recent Labs  Lab 10/28/23 1411  AST 92*  ALT 83*  ALKPHOS 124  BILITOT 1.1  PROT 7.9  ALBUMIN 3.9   No results for input(s): "LIPASE", "AMYLASE" in the last 168 hours. No results for input(s): "AMMONIA" in the last 168 hours. Coagulation Profile: No results for input(s): "INR", "PROTIME" in the last 168 hours. Cardiac Enzymes: No results for input(s): "CKTOTAL", "CKMB", "CKMBINDEX", "TROPONINI", "TROPONINIHS" in the last 168 hours. BNP (last 3 results) No results for input(s): "BNP" in the last 8760 hours. HbA1C: No results for input(s): "HGBA1C" in the last 72 hours. CBG: No results for input(s): "GLUCAP" in the last 168 hours. Lipid Profile: No results for input(s): "CHOL", "HDL", "LDLCALC", "TRIG", "CHOLHDL", "LDLDIRECT" in the last 72 hours. Thyroid Function Tests: No results for input(s): "TSH", "T4TOTAL", "FREET4", "T3FREE", "THYROIDAB" in the last 72 hours. Anemia Panel: No results for input(s): "VITAMINB12", "FOLATE", "FERRITIN", "TIBC", "IRON", "RETICCTPCT" in the last 72 hours. Urine analysis:    Component Value Date/Time   COLORURINE YELLOW 07/24/2010 1420   APPEARANCEUR HAZY (A) 07/24/2010 1420   LABSPEC 1.021 07/24/2010 1420   PHURINE 5.5 07/24/2010 1420   GLUCOSEU NEGATIVE 07/10/2010 1947   HGBUR NEGATIVE 07/24/2010 1420   BILIRUBINUR NEGATIVE 07/24/2010 1420   KETONESUR NEGATIVE 07/24/2010 1420   PROTEINUR NEGATIVE 07/24/2010 1420   UROBILINOGEN 0.2 07/24/2010 1420   NITRITE NEGATIVE 07/24/2010 1420   LEUKOCYTESUR SMALL (A) 07/24/2010 1420    Radiological Exams on Admission: I have personally reviewed images No results found.  EKG: My personal interpretation of EKG shows: EKG showing normal sinus rhythm heart rate 62.  Normal QTc interval.  There is no ST interval abnormality.   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 - Presentation to ED patient found borderline hypotensive otherwise hemodynamically stable - CMP showing elevated creatinine 2.19 and GFR 23.  It is unsure that patient's renal function has been progressed versus developed AKI in the setting of diarrhea and dehydration. - In the ED patient has been given 1 L of LR bolus. - Checking UA, urine creatinine and urine sodium. - Obtaining renal ultrasound. - Continue maintenance fluid LR 100 cc/h and avoid nephrotoxic agents.  Holding home blood pressure regimen losartan and hydrochlorothiazide in the setting of AKI.  Acute diarrhea-for 2 weeks -Patient is complaining about diarrhea for 2 weeks.  She reporting dark-colored stool/diarrhea for 2 weeks. -Concern for diarrhea in the setting of metformin use versus side effect of allopurinol versus GI infection.  Patient is afebrile.  CBC no evidence of leukocytosis. -Chart review patient has history of left lower extremity nonhealing pressure ulcer and she has been intermittently taking Bactrim/doxycycline outpatient. -Checking GI and C. difficile panel. -Avoid Imodium until imodium until C. difficile. - Holding metformin and allopurinol in the setting of diarrhea. - Continue maintenance fluid.   Acute  on chronic generalized weakness Difficulty ambulation and walking due to weakness -Patient has history of CVA with residual left-sided weakness.  The patient is complaining about right-sided weakness with ambulation in the setting of ongoing diarrhea and poor appetite. -Management for dehydration and diarrhea as mentioned above - Consulted inpatient PT and OT for evaluation.  Transaminitis -Elevated AST/ALT 92/83 in the setting of dehydration and hypotension - Normal hepatitis panel. - Holding Lipitor in the setting of transaminitis.  Will resume once trending down to baseline normal range.  History of CVA residual left-sided weakness -Continue aspirin and Plavix.  Holding Lipitor in the setting of transaminitis.  Non-insulin-dependent DM type II -Holding metformin in the setting of AKI and diarrhea. -Continue glipizide. - Continue check POC blood glucose with meals.  Hyperlipidemia -Holding Lipitor in the setting of transaminitis  Nonhealing pressure ulcer of the left lower extremity -Patient has chronic nonhealing pressure ulcer of the left lower extremity.  Follows outpatient wound care. - Consulting inpatient wound care for evaluation recommendation.  Microcytic anemia Anemia of chronic disease -Hemoglobin 12 and hematocrit 37.  Low MCV 77 Unknown baseline for last 10 to 12 years. - Continue oral iron supplement.  History of gout - Patient is on allopurinol 100 mg daily.  However it needs to be adjusted based on renal clearance 50 mg every other day.  In the setting of diarrhea holding allopurinol.  Will resume once appropriate.   Essential hypertension -  At presentation to ED patient blood pressure is soft low 120s range.  At home patient is on atenolol losartan, amlodipine and hydrochlorothiazide.  Holding losartan and hydrochlorothiazide in the setting of AKI.  During my evaluation at the bedside systolic blood pressure in upper 150s range.   -EG showing sinus bradycardia  heart rate 62.  Holding atenolol. -Will continue amlodipine 10 mg for tonight as blood pressure has been trending up.  However if redevelops hypotension need to hold amlodipine as well.   Morbid obesity BMI 73. Chronic lymphedema of the bilateral lower extremities Physical exam showed chronic nonpitting edema and lymphedema of the bilateral lower extremity.  Also there is left-sided ankle brace in place.    DVT prophylaxis:  SQ Heparin Code Status:  Full Code Diet: Heart healthy carb modified diet Family Communication:   Family was present at bedside, at the time of interview. Opportunity was given to ask question and all questions were answered satisfactorily.  Disposition Plan: Follow-up with GI pathogen and C. difficile panel.  Pending PT and OT evaluation Consults: Physical therapy and Occupational Therapy Admission status:   Observation, Telemetry bed  Severity of Illness: The appropriate patient status for this patient is OBSERVATION. Observation status is judged to be reasonable and necessary in order to provide the required intensity of service to ensure the patient's safety. The patient's presenting symptoms, physical exam findings, and initial radiographic and laboratory data in the context of their medical condition is felt to place them at decreased risk for further clinical deterioration. Furthermore, it is anticipated that the patient will be medically stable for discharge from the hospital within 2 midnights of admission.     Avleen Bordwell, MD [facility]  How to contact the [facility] Attending or Consulting provider 7A - 7P or covering provider during after hours 7P -7A, for this patient.  Check the care team in [facility] and look for a) attending/consulting [facility] provider listed and b) the [facility] team listed Log into [email or web address] and use [place]'s universal password to access. If you do not have the password, please contact the hospital operator. Locate the [facility] provider you  are looking for under [facility] and page to a number that you can be directly reached. If you still have difficulty reaching the provider, please page the [facility] (Director on Call) for the Hospitalists listed on amion for assistance.  10/28/2023, 8:51 PM

## 2023-10-28 NOTE — Progress Notes (Signed)
 New Admission Note:  Arrival Method: [place] Mental Orientation: Alert and oriented x 3 Telemetry: Box 06 Assessment: Completed Skin: warm and dry. Lymphedema BLE IV: NSL Pain: Denies Tubes: N/A Safety Measures: Safety Fall Prevention Plan initiated.  Admission: Completed 5 M  Orientation: Patient has been orientated to the room, unit and the staff. Welcome booklet given.  Family: N/A  Orders have been reviewed and implemented. Will continue to monitor the patient. Call light has been placed within reach and bed alarm has been activated.   Woodward Head BSN, RN  Phone Number: [phone]

## 2023-10-28 NOTE — ED Triage Notes (Addendum)
 BIB [facility] from home, patient had a doctors appointment today for increasing diarrhea x 2 weeks, patient family called ems due to patient becoming increasingly weak, generalized dizziness   Patient has left sided deficit due to previous stroke

## 2023-10-29 DIAGNOSIS — N179 Acute kidney failure, unspecified: Secondary | ICD-10-CM | POA: Diagnosis not present

## 2023-10-29 DIAGNOSIS — N189 Chronic kidney disease, unspecified: Secondary | ICD-10-CM | POA: Diagnosis not present

## 2023-10-29 LAB — URINALYSIS, ROUTINE W REFLEX MICROSCOPIC
Bilirubin Urine: NEGATIVE
Glucose, UA: NEGATIVE mg/dL
Ketones, ur: NEGATIVE mg/dL
Leukocytes,Ua: NEGATIVE
Nitrite: NEGATIVE
Protein, ur: NEGATIVE mg/dL
Specific Gravity, Urine: 1.009 (ref 1.005–1.030)
pH: 7 (ref 5.0–8.0)

## 2023-10-29 LAB — GLUCOSE, CAPILLARY
Glucose-Capillary: 109 mg/dL — ABNORMAL HIGH (ref 70–99)
Glucose-Capillary: 115 mg/dL — ABNORMAL HIGH (ref 70–99)
Glucose-Capillary: 46 mg/dL — ABNORMAL LOW (ref 70–99)
Glucose-Capillary: 73 mg/dL (ref 70–99)
Glucose-Capillary: 83 mg/dL (ref 70–99)

## 2023-10-29 LAB — COMPREHENSIVE METABOLIC PANEL WITH GFR
ALT: 73 U/L — ABNORMAL HIGH (ref 0–44)
AST: 78 U/L — ABNORMAL HIGH (ref 15–41)
Albumin: 3.1 g/dL — ABNORMAL LOW (ref 3.5–5.0)
Alkaline Phosphatase: 90 U/L (ref 38–126)
Anion gap: 9 (ref 5–15)
BUN: 19 mg/dL (ref 8–23)
CO2: 25 mmol/L (ref 22–32)
Calcium: 9.1 mg/dL (ref 8.9–10.3)
Chloride: 105 mmol/L (ref 98–111)
Creatinine, Ser: 1.56 mg/dL — ABNORMAL HIGH (ref 0.44–1.00)
GFR, Estimated: 34 mL/min — ABNORMAL LOW (ref >60)
Glucose, Bld: 75 mg/dL (ref 70–99)
Potassium: 3.2 mmol/L — ABNORMAL LOW (ref 3.5–5.1)
Sodium: 139 mmol/L (ref 135–145)
Total Bilirubin: 1 mg/dL (ref 0.0–1.2)
Total Protein: 6.3 g/dL — ABNORMAL LOW (ref 6.5–8.1)

## 2023-10-29 LAB — CREATININE, URINE, RANDOM: Creatinine, Urine: 46 mg/dL

## 2023-10-29 LAB — ABO/RH: ABO/RH(D): A POS

## 2023-10-29 LAB — HEMOGLOBIN A1C
Hgb A1c MFr Bld: 5.5 % (ref 4.8–5.6)
Mean Plasma Glucose: 111.15 mg/dL

## 2023-10-29 LAB — CBC
HCT: 32 % — ABNORMAL LOW (ref 36.0–46.0)
Hemoglobin: 10.8 g/dL — ABNORMAL LOW (ref 12.0–15.0)
MCH: 25.7 pg — ABNORMAL LOW (ref 26.0–34.0)
MCHC: 33.8 g/dL (ref 30.0–36.0)
MCV: 76 fL — ABNORMAL LOW (ref 80.0–100.0)
Platelets: 137 10*3/uL — ABNORMAL LOW (ref 150–400)
RBC: 4.21 MIL/uL (ref 3.87–5.11)
RDW: 21.5 % — ABNORMAL HIGH (ref 11.5–15.5)
WBC: 5 10*3/uL (ref 4.0–10.5)
nRBC: 0 % (ref 0.0–0.2)

## 2023-10-29 LAB — SODIUM, URINE, RANDOM: Sodium, Ur: 126 mmol/L

## 2023-10-29 MED ORDER — INSULIN ASPART 100 UNIT/ML IJ SOLN: 0.0000 [IU] | Freq: Three times a day (TID) | INTRAMUSCULAR | Status: DC

## 2023-10-29 MED ORDER — POTASSIUM CHLORIDE CRYS ER 20 MEQ PO TBCR
40.0000 meq | EXTENDED_RELEASE_TABLET | Freq: Two times a day (BID) | ORAL | Status: AC
  Administered 2023-10-29 (×2): 40 meq via ORAL
  Filled 2023-10-29 (×3): qty 2

## 2023-10-29 MED ORDER — FAMOTIDINE 20 MG PO TABS
20.0000 mg | ORAL_TABLET | Freq: Every day | ORAL | Status: DC
  Administered 2023-10-30 – 2023-11-01 (×3): 20 mg via ORAL
  Filled 2023-10-29 (×3): qty 1

## 2023-10-29 NOTE — Evaluation (Signed)
 Physical Therapy Evaluation Patient Details Name: Laurie Hunter MRN: [id number] DOB: 1945/11/26 Today's Date: 10/29/2023  History of Present Illness  Laurie Hunter is a 78 y.o. 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  Family reported patient is also becoming more weak and generalized dizziness.  Patient reporting that she has loose stool dark-colored for last 2 weeks.  She today she went to see wound care clinic found to have weakness with ambulation and dizziness and felt like the right leg not working well.   Clinical Impression  Patient received sitting edge of bed with OT present. She requires assistance to sit up on side of bed and maintain balance. Patient requires extensive assistance with donning L LE AFO, and positioning LEs in optimal position to attempt to stand.  Required +2 max A to stand briefly. Unable to maintain standing balance without +2 max A. She was unable to take any steps. Patient will continue to benefit from skilled PT to improve strength and functional mobility.          If plan is discharge home, recommend the following: Two people to help with walking and/or transfers;Two people to help with bathing/dressing/bathroom   Can travel by private vehicle   No    Equipment Recommendations None recommended by PT  Recommendations for Other Services       Functional Status Assessment Patient has had a recent decline in their functional status and demonstrates the ability to make significant improvements in function in a reasonable and predictable amount of time.     Precautions / Restrictions Precautions Precautions: Fall Recall of  Precautions/Restrictions: Intact Restrictions Weight Bearing Restrictions Per Provider Order: No      Mobility  Bed Mobility Overal bed mobility: Needs Assistance             General bed mobility comments: received on edge of bed with OT present. OT reports patient needed significant assistance to get to edge of bed.    Transfers Overall transfer level: Needs assistance Equipment used: Rolling walker (2 wheels) Transfers: Sit to/from Stand Sit to Stand: Via lift equipment, Max assist, +2 physical assistance           General transfer comment: Patient stood 1x from bed with max +2 assist. Second attempt stedy was used as she needed to move up in the bed. Again Max +2 assist. Transfer via Lift Equipment: Stedy  Ambulation/Gait               General Gait Details: unable to take any steps or get balance in standing without Max +2 Assist  Stairs            Wheelchair Mobility     Tilt Bed    Modified Rankin (Stroke Patients Only)       Balance Overall balance assessment: Needs assistance Sitting-balance support: Feet supported Sitting balance-Leahy Scale: Fair     Standing balance support: Bilateral upper extremity supported, During functional activity, Reliant on assistive device for balance Standing balance-Leahy Scale: Poor                               Pertinent Vitals/Pain Pain Assessment Pain Assessment: No/denies pain  Home Living Family/patient expects to be discharged to:: Private residence Living Arrangements: Other relatives Available Help at Discharge: Family;Available 24 hours/day Type of Home: House Home Access: Ramped entrance       Home Layout: Two level;Able to live on main level with bedroom/bathroom;Full bath on main level Home Equipment: Rollator (4 wheels);Shower seat      Prior Function Prior Level of Function : Needs assist       Physical Assist : Mobility (physical);ADLs (physical) Mobility  (physical): Bed mobility;Transfers;Gait ADLs (physical): Grooming;Bathing;Dressing;IADLs Mobility Comments: patient lives with sister who has cancer. Brother also lived with them but he recently passed away. Was ambulating with 4WW prior to this admission ADLs Comments: Patient requires assistance from sister at baseline     Extremity/Trunk Assessment   Upper Extremity Assessment Upper Extremity Assessment: LUE deficits/detail LUE Coordination: decreased gross motor    Lower Extremity Assessment Lower Extremity Assessment: LLE deficits/detail LLE Coordination: decreased gross motor    Cervical / Trunk Assessment Cervical / Trunk Assessment: Normal  Communication   Communication Communication: No apparent difficulties    Cognition Arousal: Alert Behavior During Therapy: WFL for tasks assessed/performed   PT - Cognitive impairments: No apparent impairments                         Following commands: Intact       Cueing Cueing Techniques: Verbal cues     General Comments      Exercises     Assessment/Plan    PT Assessment Patient needs continued PT services  PT Problem List Decreased strength;Decreased activity tolerance;Decreased balance;Decreased mobility;Decreased safety awareness;Obesity;Decreased skin integrity       PT Treatment Interventions DME instruction;Gait training;Functional mobility training;Therapeutic activities;Therapeutic exercise;Balance training;Neuromuscular re-education;Patient/family education    PT Goals (Current goals can be found in the Care Plan section)  Acute Rehab PT Goals Patient Stated Goal: return home to help sister PT Goal Formulation: With patient Time For Goal Achievement: 11/12/23 Potential to Achieve Goals: Poor    Frequency Min 3X/week     Co-evaluation PT/OT/SLP Co-Evaluation/Treatment: Yes Reason for Co-Treatment: For patient/therapist safety;To address functional/ADL transfers PT goals addressed  during session: Mobility/safety with mobility;Balance;Proper use of DME         AM-PAC PT "6 Clicks" Mobility  Outcome Measure Help needed turning from your back to your side while in a flat bed without using bedrails?: Total Help needed moving from lying on your back to sitting on the side of a flat bed without using bedrails?: Total Help needed moving to and from a bed to a chair (including a wheelchair)?: Total Help needed standing up from a chair using your arms (e.g., wheelchair or bedside chair)?: Total Help needed to walk in hospital room?: Total Help needed climbing 3-5 steps with a railing? : Total 6 Click Score: 6    End of Session   Activity Tolerance: Patient limited by fatigue Patient left: in bed;with call bell/phone within reach;with bed alarm set Nurse Communication: Mobility status PT Visit Diagnosis: Other abnormalities of gait and mobility (R26.89);Muscle weakness (generalized) (M62.81);Unsteadiness on feet (R26.81);Difficulty in walking, not elsewhere classified (R26.2);Hemiplegia and hemiparesis Hemiplegia - Right/Left: Left Hemiplegia - dominant/non-dominant: Non-dominant Hemiplegia - caused by: Cerebral infarction    Time: 0981-1914 PT Time Calculation (min) (ACUTE ONLY): 29 min   Charges:   PT Evaluation $PT Eval Moderate Complexity: 1 Mod   PT General Charges $$ ACUTE PT VISIT: 1 Visit  Naftula Donahue, PT, GCS 10/29/23,11:26 AM

## 2023-10-29 NOTE — Care Management Obs Status (Signed)
 MEDICARE OBSERVATION STATUS NOTIFICATION   Patient Details  Name: ANIDA DEOL MRN: [id number] Date of Birth: April 08, 1946   Medicare Observation Status Notification Given:  Yes    Tom-Johnson, Angelique Ken, RN 10/29/2023, 1:21 PM

## 2023-10-29 NOTE — Progress Notes (Signed)
 Laurie Hunter  UYQ:[id number] DOB: Mar 29, 1946 DOA: 10/28/2023 PCP: Jearldine Mina, MD    Brief Narrative:  78 year old with a history of CVA with residual left-sided weakness, DM2, HTN, CKD stage III, anemia of chronic disease, HLD, and gout who presented to the ER 4/14 with a 2-week history of persistent unrelenting diarrhea.  This became associated with severe generalized weakness and dizziness.  In the ER she was found to have a creatinine of 2.13 with no baseline available.  Hemoglobin was 12.5 with normal WBC.  She was guaiac negative.  Goals of Care:   Code Status: Full Code   DVT prophylaxis: heparin injection 5,000 Units Start: 10/28/23 2200 SCDs Start: 10/28/23 2030 Place TED hose Start: 10/28/23 2030   Interim Hx: Afebrile since admission.  Blood pressure has improved/stabilized.  Vitals otherwise stable.  Tolerating some oral intake at the time of my visit.  States that her diarrhea seems to have ceased for now.  Denies shortness of breath or chest pain.  Assessment & Plan:  Persistent acute diarrhea x 2 weeks Differential includes infectious versus allopurinol related versus metformin related -has required recent doses of antibiotic in the outpatient setting related to her lower extremity wound -stool studies pending but at present it appears that her diarrhea may have spontaneously resolved  AKI on CKD stage IIIa crt 2.19 at presentation -no recent blood work to give baseline -creatinine was 0.96 with GFR of 59 in 2012 - creatinine improving with volume resuscitation -renal ultrasound without acute findings  Transaminitis Likely mild shock liver in setting of dehydration and hypotension -acute hepatitis panel unrevealing - holding Lipitor  Hypokalemia Due to increased GI loss - supplement and follow - check magnesium  Severe generalized weakness Due to above  History of CVA with chronic left-sided weakness Continue aspirin and Plavix - holding Lipitor for now due to  transaminitis  DM2 Metformin on hold due to above - CBG presently controlled - A1c 5.5  HLD Lipitor on hold due to above  Microcytic anemia -anemia of chronic disease Unknown recent hemoglobin baseline -hemoglobin 12 at presentation and decreasing as expected with volume expansion -follow trend  Gout Was on allopurinol 100 mg daily prior to admission -on hold for now due to AKI  HTN Holding home blood pressure medications in setting of dehydration  Morbid obesity - Body mass index is 45.64 kg/m.  Chronic bilateral lower extremity lymphedema   Family Communication: No family present at time of exam Disposition: PT/OT evaluations -will depend upon stool studies/recurrence of diarrhea and how patient performs with therapy   Objective: Blood pressure 139/70, pulse 69, temperature 98.6 F (37 C), resp. rate 18, height 4\' 11"  (1.499 m), weight 102.5 kg, SpO2 97%.  Intake/Output Summary (Last 24 hours) at 10/29/2023 0930 Last data filed at 10/29/2023 0913 Gross per 24 hour  Intake 2101.14 ml  Output 800 ml  Net 1301.14 ml   Filed Weights   10/28/23 1356 10/28/23 2156  Weight: 108.9 kg 102.5 kg    Examination: General: No acute respiratory distress Lungs: Clear to auscultation bilaterally without wheezes or crackles Cardiovascular: Regular rate and rhythm without murmur gallop or rub normal S1 and S2 Abdomen: Nontender, nondistended, soft, bowel sounds positive, no rebound, no ascites, no appreciable mass Extremities: No significant cyanosis, clubbing, or edema bilateral lower extremities  CBC: Recent Labs  Lab 10/28/23 1411 10/29/23 0506  WBC 5.9 5.0  HGB 12.3 10.8*  HCT 37.4 32.0*  MCV 77.3* 76.0*  PLT 162 137*  Basic Metabolic Panel: Recent Labs  Lab 10/28/23 1411 10/29/23 0506  NA 138 139  K 4.7 3.2*  CL 102 105  CO2 24 25  GLUCOSE 86 75  BUN 27* 19  CREATININE 2.13* 1.56*  CALCIUM 10.0 9.1   GFR: Estimated Creatinine Clearance: 31.4 mL/min (A)  (by C-G formula based on SCr of 1.56 mg/dL (H)).   Scheduled Meds:  amLODipine  10 mg Oral Daily   aspirin EC  162 mg Oral Daily   clopidogrel  75 mg Oral Daily   famotidine  20 mg Oral BID   ferrous sulfate  325 mg Oral Q breakfast   glipiZIDE  5 mg Oral QAC breakfast   heparin  5,000 Units Subcutaneous Q8H   sodium chloride flush  3 mL Intravenous Q12H   Continuous Infusions:  sodium chloride     lactated ringers 100 mL/hr at 10/29/23 0855     LOS: 0 days   Abbe Abate, MD [facility] Office  [phone] Pager - Text Page per Tilford Foley  If 7PM-7AM, please contact night-coverage per Amion 10/29/2023, 9:30 AM

## 2023-10-29 NOTE — Consult Note (Addendum)
 WOC team consulted for chronic L leg wound followed at wound care center. No wound care center notes found in EMR.  Secure chat to primary nurse regarding wound who states that there is no wound observed.  Bedside nurse spoke with patient who states they did go to wound care center but that wound is completely healed.  No wound care necessary at this time per patient and bedside nurse.   WOC team will sign off. Please reconsult if any needs are identified.    Thank you,    Ronni Colace MSN, RN-BC, [employer] [phone]

## 2023-10-29 NOTE — Plan of Care (Signed)
   Problem: Education: Goal: Knowledge of General Education information will improve Description: Including pain rating scale, medication(s)/side effects and non-pharmacologic comfort measures Outcome: Completed/Met

## 2023-10-29 NOTE — Evaluation (Signed)
 Occupational Therapy Evaluation Patient Details Name: Laurie Hunter MRN: [id number] DOB: Mar 14, 1946 Today's Date: 10/29/2023   History of Present Illness   Laurie Hunter is a 78 y.o. 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  Family reported patient is also becoming more weak and generalized dizziness.  Patient reporting that she has loose stool dark-colored for last 2 weeks.  She today she went to see wound care clinic found to have weakness with ambulation and dizziness and felt like the right leg not working well.     Clinical Impressions Pt presents with decline in function and safety with ADLs and ADL mobility with impaired strength, balance and endurance; pt with hx of CVA with L UE impaired AROM, coordination and strength. PTA pt lived with her sister (brother was living there too but just passed away a few days ago). Pt reports that she was Ind with UB ADLs, grooming and toileting but required assist from her sister with all LB ADLs and getting in and out of bed (sister has cancer), meals and home mgt "as best she can". Pt currently requires mod - total A with bed mobility, mod A with UB ADLs, total A with LB ADLs, total A with toileting and max + 2 sit - stand with use of Stedy. Recommend <3 hours/day for post acute rehab before return home. OT will follow acutely to maximize level of function and safety     If plan is discharge home, recommend the following:   A lot of help with bathing/dressing/bathroom;Two people to help with walking and/or transfers;Help with stairs or ramp for entrance     Functional Status Assessment   Patient has had a recent decline in their  functional status and demonstrates the ability to make significant improvements in function in a reasonable and predictable amount of time.     Equipment Recommendations   None recommended by OT     Recommendations for Other Services         Precautions/Restrictions   Precautions Precautions: Fall Recall of Precautions/Restrictions: Intact Restrictions Weight Bearing Restrictions Per Provider Order: No     Mobility Bed Mobility Overal bed mobility: Needs Assistance Bed Mobility: Supine to Sit, Sit to Supine           General bed mobility comments: mod A to elevate trunk, total A to manage L LE to EOB and off EOb, total A +2 to return to supine    Transfers Overall transfer level: Needs assistance Equipment used: Rolling walker (2 wheels) Transfers: Sit to/from Stand Sit to Stand: Via lift equipment, Max assist, +2 physical assistance           General transfer comment: Patient stood 1x from bed with max +2 assist. Second attempt stedy was used as she needed to move up in the bed. Again Max +2 assist.      Balance Overall balance assessment: Needs assistance Sitting-balance support: Feet supported Sitting balance-Leahy Scale: Fair     Standing balance support: Bilateral upper extremity supported, During functional activity, Reliant on assistive device for balance Standing balance-Leahy Scale: Poor  ADL either performed or assessed with clinical judgement   ADL Overall ADL's : Needs assistance/impaired Eating/Feeding: Independent   Grooming: Wash/dry hands;Wash/dry face;Contact guard assist;Sitting   Upper Body Bathing: Moderate assistance;Sitting   Lower Body Bathing: Total assistance   Upper Body Dressing : Moderate assistance;Sitting   Lower Body Dressing: Total assistance     Toilet Transfer Details (indicate cue type and reason): sit - stand max A +2 with Stedy Toileting- Clothing Manipulation and  Hygiene: Total assistance;Bed level       Functional mobility during ADLs: Maximal assistance;+2 for physical assistance Laurie Hunter)       Vision Baseline Vision/History: 1 Wears glasses Ability to See in Adequate Light: 0 Adequate Patient Visual Report: No change from baseline       Perception         Praxis         Pertinent Vitals/Pain Pain Assessment Pain Assessment: No/denies pain     Extremity/Trunk Assessment Upper Extremity Assessment Upper Extremity Assessment: Generalized weakness;Right hand dominant;LUE deficits/detail LUE Deficits / Details: hx of wekaness and impaired AROM since CVA in 2007 LUE Coordination: decreased gross motor   Lower Extremity Assessment Lower Extremity Assessment: Defer to PT evaluation LLE Coordination: decreased gross motor   Cervical / Trunk Assessment Cervical / Trunk Assessment: Normal;Other exceptions (large body habitus)   Communication Communication Communication: No apparent difficulties   Cognition Arousal: Alert Behavior During Therapy: WFL for tasks assessed/performed                                 Following commands: Intact       Cueing  General Comments   Cueing Techniques: Verbal cues      Exercises     Shoulder Instructions      Home Living Family/patient expects to be discharged to:: Private residence Living Arrangements: Other relatives (sister) Available Help at Discharge: Family;Available 24 hours/day Type of Home: House Home Access: Ramped entrance     Home Layout: Two level;Able to live on main level with bedroom/bathroom;Full bath on main level     Bathroom Shower/Tub: Producer, television/film/video: Standard     Home Equipment: Rollator (4 wheels);Shower seat          Prior Functioning/Environment Prior Level of Function : Needs assist       Physical Assist : Mobility (physical);ADLs (physical) Mobility (physical): Bed mobility;Transfers;Gait ADLs  (physical): Grooming;Bathing;Dressing;IADLs Mobility Comments: patient lives with sister who has cancer. Brother also lived with them but he recently passed away. Was ambulating with 4WW prior to this admission ADLs Comments: Patient requires assistance from sister for LB ADLs at baseline    OT Problem List: Decreased strength;Impaired balance (sitting and/or standing);Decreased activity tolerance;Decreased coordination;Impaired UE functional use;Decreased range of motion;Obesity   OT Treatment/Interventions: Self-care/ADL training;DME and/or AE instruction;Therapeutic activities;Balance training;Therapeutic exercise;Patient/family education      OT Goals(Current goals can be found in the care plan section)   Acute Rehab OT Goals Patient Stated Goal: go home OT Goal Formulation: With patient Time For Goal Achievement: 11/12/23 Potential to Achieve Goals: Good ADL Goals Pt Will Perform Grooming: with supervision;with set-up;sitting Pt Will Perform Upper Body Bathing: with min assist;with contact guard assist;sitting Pt Will Perform Lower Body Bathing: with max assist;with mod assist;sitting/lateral leans Pt Will Perform Upper Body Dressing: with min assist;with contact guard assist;sitting Pt Will Transfer to Toilet: with max assist;with mod assist;stand pivot transfer;bedside commode  OT Frequency:  Min 2X/week    Co-evaluation PT/OT/SLP Co-Evaluation/Treatment: Yes Reason for Co-Treatment: For patient/therapist safety;To address functional/ADL transfers PT goals addressed during session: Mobility/safety with mobility;Balance;Proper use of DME OT goals addressed during session: ADL's and self-care;Proper use of Adaptive equipment and DME      AM-PAC OT "6 Clicks" Daily Activity     Outcome Measure Help from another person eating meals?: None Help from another person taking care of personal grooming?: A Little Help from another person toileting, which includes using toliet,  bedpan, or urinal?: Total Help from another person bathing (including washing, rinsing, drying)?: A Lot Help from another person to put on and taking off regular upper body clothing?: A Lot Help from another person to put on and taking off regular lower body clothing?: Total 6 Click Score: 13   End of Session Equipment Utilized During Treatment: Gait belt;Other (comment) Octaviano Belts)  Activity Tolerance: Patient limited by fatigue Patient left: in bed;with call bell/phone within reach;with nursing/sitter in room  OT Visit Diagnosis: Other abnormalities of gait and mobility (R26.89);Muscle weakness (generalized) (M62.81)                Time: 1610-9604 OT Time Calculation (min): 38 min Charges:  OT General Charges $OT Visit: 1 Visit OT Evaluation $OT Eval Moderate Complexity: 1 Mod OT Treatments $Therapeutic Activity: 8-22 mins    Alfred Ann 10/29/2023, 12:43 PM

## 2023-10-30 DIAGNOSIS — R197 Diarrhea, unspecified: Secondary | ICD-10-CM | POA: Diagnosis not present

## 2023-10-30 DIAGNOSIS — N179 Acute kidney failure, unspecified: Secondary | ICD-10-CM | POA: Diagnosis not present

## 2023-10-30 DIAGNOSIS — N189 Chronic kidney disease, unspecified: Secondary | ICD-10-CM | POA: Diagnosis not present

## 2023-10-30 LAB — COMPREHENSIVE METABOLIC PANEL WITH GFR
ALT: 70 U/L — ABNORMAL HIGH (ref 0–44)
AST: 68 U/L — ABNORMAL HIGH (ref 15–41)
Albumin: 2.8 g/dL — ABNORMAL LOW (ref 3.5–5.0)
Alkaline Phosphatase: 76 U/L (ref 38–126)
Anion gap: 7 (ref 5–15)
BUN: 14 mg/dL (ref 8–23)
CO2: 24 mmol/L (ref 22–32)
Calcium: 8.8 mg/dL — ABNORMAL LOW (ref 8.9–10.3)
Chloride: 106 mmol/L (ref 98–111)
Creatinine, Ser: 1.29 mg/dL — ABNORMAL HIGH (ref 0.44–1.00)
GFR, Estimated: 42 mL/min — ABNORMAL LOW (ref >60)
Glucose, Bld: 85 mg/dL (ref 70–99)
Potassium: 3.7 mmol/L (ref 3.5–5.1)
Sodium: 137 mmol/L (ref 135–145)
Total Bilirubin: 0.8 mg/dL (ref 0.0–1.2)
Total Protein: 5.8 g/dL — ABNORMAL LOW (ref 6.5–8.1)

## 2023-10-30 LAB — GLUCOSE, CAPILLARY
Glucose-Capillary: 85 mg/dL (ref 70–99)
Glucose-Capillary: 165 mg/dL — ABNORMAL HIGH (ref 70–99)
Glucose-Capillary: 146 mg/dL — ABNORMAL HIGH (ref 70–99)
Glucose-Capillary: 170 mg/dL — ABNORMAL HIGH (ref 70–99)

## 2023-10-30 LAB — CBC
HCT: 31 % — ABNORMAL LOW (ref 36.0–46.0)
Hemoglobin: 10.3 g/dL — ABNORMAL LOW (ref 12.0–15.0)
MCH: 25.3 pg — ABNORMAL LOW (ref 26.0–34.0)
MCHC: 33.2 g/dL (ref 30.0–36.0)
MCV: 76.2 fL — ABNORMAL LOW (ref 80.0–100.0)
Platelets: 132 10*3/uL — ABNORMAL LOW (ref 150–400)
RBC: 4.07 MIL/uL (ref 3.87–5.11)
RDW: 21.3 % — ABNORMAL HIGH (ref 11.5–15.5)
WBC: 5.3 10*3/uL (ref 4.0–10.5)
nRBC: 0.8 % — ABNORMAL HIGH (ref 0.0–0.2)

## 2023-10-30 LAB — PHOSPHORUS: Phosphorus: 2.9 mg/dL (ref 2.5–4.6)

## 2023-10-30 LAB — MAGNESIUM: Magnesium: 1.4 mg/dL — ABNORMAL LOW (ref 1.7–2.4)

## 2023-10-30 NOTE — Progress Notes (Signed)
  Progress Note   Patient: Laurie Hunter ZHY:[id number] DOB: 05/02/1946 DOA: 10/28/2023     0 DOS: the patient was seen and examined on 10/30/2023   Brief hospital course: 78 year old with a history of CVA with residual left-sided weakness, DM2, HTN, CKD stage III, anemia of chronic disease, HLD, and gout who presented to the ER 4/14 with a 2-week history of persistent unrelenting diarrhea.  This became associated with severe generalized weakness and dizziness.  In the ER she was found to have a creatinine of 2.13 with no baseline available.  Hemoglobin was 12.5 with normal WBC.  She was guaiac negative.    Assessment and Plan: Persistent acute diarrhea x 2 weeks -resolved. No further episodes -removed contact isolation   AKI on CKD stage IIIa crt 2.19 at presentation -no recent blood work to give baseline -creatinine was 0.96 with GFR of 59 in 2012 -renal function improved with hydration   Transaminitis Likely mild shock liver in setting of dehydration and hypotension -acute hepatitis panel unrevealing - holding Lipitor   Hypokalemia Due to increased GI loss -normalized   Severe generalized weakness Due to above Pendiing SNF   History of CVA with chronic left-sided weakness Continue aspirin and Plavix - holding Lipitor for now due to transaminitis   DM2 Metformin on hold due to above - CBG presently controlled - A1c 5.5   HLD Lipitor on hold due to above   Microcytic anemia -anemia of chronic disease Unknown recent hemoglobin baseline -hemoglobin 12 at presentation and decreasing as expected with volume expansion -Hemodynamically stable   Gout Was on allopurinol 100 mg daily prior to admission -on hold for now due to AKI   HTN Holding home blood pressure medications in setting of dehydration   Morbid obesity - Body mass index is 45.64 kg/m.   Chronic bilateral lower extremity lymphedema   Subjective: Feeling better today  Physical Exam: Vitals:   10/29/23 1651  10/29/23 2120 10/30/23 0530 10/30/23 0921  BP: 134/62 129/65 (!) 148/73 (!) 146/72  Pulse: 64  (!) 58 67  Resp: 18 18 18 18   Temp: 98.3 F (36.8 C) 98.6 F (37 C) 98.7 F (37.1 C) 98.3 F (36.8 C)  TempSrc:  Oral Oral Oral  SpO2: 98% 100% 95% 100%  Weight:      Height:       General exam: Awake, laying in bed, in nad Respiratory system: Normal respiratory effort, no wheezing Cardiovascular system: regular rate, s1, s2 Gastrointestinal system: Soft, nondistended, positive BS Central nervous system: CN2-12 grossly intact, strength intact Extremities: Perfused, no clubbing Skin: Normal skin turgor, no notable skin lesions seen Psychiatry: Mood normal // no visual hallucinations   Data Reviewed:  Labs reviewed: Na 137, K 3.7, Cr 1.29, WBC 5.3, hgb 10.3, Plts 132  Family Communication: Pt in room, family not at bedside  Disposition: Status is: Observation The patient remains OBS appropriate and will d/c before 2 midnights.  Planned Discharge Destination: Skilled nursing facility     Author: Cherylle Corwin, MD 10/30/2023 5:34 PM  For on call review [email or web address].

## 2023-10-30 NOTE — NC FL2 (Addendum)
 [place] MEDICAID FL2 LEVEL OF CARE FORM     IDENTIFICATION  Patient Name: Laurie Hunter Birthdate: 09/17/1945 Sex: female Admission Date (Current Location): 10/28/2023  [facility] and [place] Number:  Producer, television/film/video and Address:  [facility], [address] [place], [place] [address]      Provider Number: [id number]  Attending Physician Name and Address:  Jerald Kief, MD  Relative Name and Phone Number:       Current Level of Care: Hospital Recommended Level of Care: Skilled Nursing Facility Prior Approval Number:    Date Approved/Denied:   PASRR Number:  [id number]   Discharge Plan: SNF    Current Diagnoses: Patient Active Problem List   Diagnosis Date Noted   Diarrhea 10/28/2023   Acute kidney injury superimposed on chronic kidney disease (HCC) 10/28/2023   CVA (cerebral vascular accident) (HCC) 10/28/2023   Non-insulin treated type 2 diabetes mellitus (HCC) 10/28/2023   Generalized weakness 10/28/2023   Hyperlipidemia 10/28/2023   Nonhealing pressure ulcer of the left lower extremity 10/28/2023   Anemia of chronic disease 10/28/2023   Gout 10/28/2023   AKI (acute kidney injury) (HCC) 10/28/2023   Morbid obesity with BMI of 70 and over, adult (HCC) 10/28/2023   Chronic acquired lymphedema 12/17/2013   Difficulty walking 12/17/2013    Orientation RESPIRATION BLADDER Height & Weight     Self, Time, Situation, Place  Normal Incontinent Weight: 225 lb 15.5 oz (102.5 kg) Height:  4\' 11"  (149.9 cm)  BEHAVIORAL SYMPTOMS/MOOD NEUROLOGICAL BOWEL NUTRITION STATUS      Incontinent Diet (Regular)  AMBULATORY STATUS COMMUNICATION OF NEEDS Skin   Extensive Assist Verbally Normal                       Personal Care Assistance Level of Assistance  Feeding, Bathing, Dressing Bathing Assistance: Maximum assistance Feeding assistance: Independent Dressing Assistance: Limited assistance     Functional Limitations Info              SPECIAL CARE FACTORS FREQUENCY  PT (By licensed PT), OT (By licensed OT)     PT Frequency: 5x/week OT Frequency: 5x/week            Contractures      Additional Factors Info  Code Status, Allergies Code Status Info: Full Allergies Info: Erythromycin, Lisinopril, Penicillins           Current Medications (10/30/2023):  This is the current hospital active medication list Current Facility-Administered Medications  Medication Dose Route Frequency Provider Last Rate Last Admin   acetaminophen (TYLENOL) tablet 650 mg  650 mg Oral Q6H PRN Sundil, Subrina, MD   650 mg at 10/30/23 0408   amLODipine (NORVASC) tablet 10 mg  10 mg Oral Daily Sundil, Subrina, MD   10 mg at 10/30/23 5621   aspirin EC tablet 162 mg  162 mg Oral Daily Sundil, Subrina, MD   162 mg at 10/30/23 0933   bismuth subsalicylate (PEPTO BISMOL) 262 MG/15ML suspension 30 mL  30 mL Oral Q4H PRN Sundil, Subrina, MD       clopidogrel (PLAVIX) tablet 75 mg  75 mg Oral Daily Sundil, Subrina, MD   75 mg at 10/30/23 0933   famotidine (PEPCID) tablet 20 mg  20 mg Oral Daily Jetty Duhamel T, MD   20 mg at 10/30/23 3086   ferrous sulfate tablet 325 mg  325 mg Oral Q breakfast Janalyn Shy, Subrina, MD   325 mg at 10/30/23  0933   heparin injection 5,000 Units  5,000 Units Subcutaneous Q8H Sundil, Subrina, MD   5,000 Units at 10/30/23 0531   ondansetron (ZOFRAN) tablet 4 mg  4 mg Oral Q6H PRN Sundil, Subrina, MD       Or   ondansetron (ZOFRAN) injection 4 mg  4 mg Intravenous Q6H PRN Sundil, Subrina, MD       Oral care mouth rinse  15 mL Mouth Rinse PRN Sundil, Subrina, MD       potassium chloride SA (KLOR-CON M) CR tablet 40 mEq  40 mEq Oral BID Abbe Abate, MD   40 mEq at 10/29/23 2147     Discharge Medications: Please see discharge summary for a list of discharge medications.  Relevant Imaging Results:  Relevant Lab Results:   Additional Information SSN: [id number]  Antion Isaias March, Student-Social  Work

## 2023-10-30 NOTE — Hospital Course (Signed)
 78 year old with a history of CVA with residual left-sided weakness, DM2, HTN, CKD stage III, anemia of chronic disease, HLD, and gout who presented to the ER 4/14 with a 2-week history of persistent unrelenting diarrhea.  This became associated with severe generalized weakness and dizziness.  In the ER she was found to have a creatinine of 2.13 with no baseline available.  Hemoglobin was 12.5 with normal WBC.  She was guaiac negative.

## 2023-10-30 NOTE — TOC Progression Note (Cosign Needed)
 Transition of Care [facility]) - Progression Note    Patient Details  Name: Laurie Hunter MRN: [id number] Date of Birth: 08/01/1945  [facility] CM/SW Contact  Carrieanne Kleen Isaias March, Student-Social Work Phone Number: 10/30/2023, 3:35 PM  Clinical Narrative:   MSW Student met with patient at bedside and provided her with a list of SNFs who have accepted her. Patient is waiting on Sister to arrive at the hospital to review SNF placements with her. MSW Student to follow.    Expected Discharge Plan: Skilled Nursing Facility Barriers to Discharge: Continued Medical Work up, English as a second language teacher  Expected Discharge Plan and Services     Post Acute Care Choice: Skilled Nursing Facility Living arrangements for the past 2 months: Single Family Home                                       Social Determinants of Health (SDOH) Interventions SDOH Screenings   Food Insecurity: No Food Insecurity (10/28/2023)  Housing: Low Risk  (10/28/2023)  Transportation Needs: No Transportation Needs (10/28/2023)  Utilities: Not At Risk (10/28/2023)  Tobacco Use: Unknown (10/28/2023)    Readmission Risk Interventions     No data to display

## 2023-10-30 NOTE — TOC Initial Note (Signed)
 Transition of Care [facility]) - Initial/Assessment Note    Patient Details  Name: Laurie Hunter MRN: [id number] Date of Birth: 10-26-1945  Transition of Care [facility]) CM/SW Contact:    Cleon Thoma Isaias March, Student-Social Work Phone Number: 10/30/2023, 12:58 PM  Clinical Narrative:  MSW Student met with patient at bedside and dicussed SNF placement. Patient was agreeable to going to SNF once medically cleared. Patient indicated to MSW Student to give out SNF referral information to Sister Dayla Eva or family friend Shelvy Dickens when faxed out. MSW Student to fax out FL2  to SNF's in the area.    Expected Discharge Plan: Skilled Nursing Facility Barriers to Discharge: Continued Medical Work up, English as a second language teacher   Patient Goals and CMS Choice Patient states their goals for this hospitalization and ongoing recovery are:: To get back home CMS Medicare.gov Compare Post Acute Care list provided to:: Patient Represenative (must comment) Choice offered to / list presented to : Sibling [place] ownership interest in [facility].provided to:: Sibling    Expected Discharge Plan and Services     Post Acute Care Choice: Skilled Nursing Facility Living arrangements for the past 2 months: Single Family Home                                      Prior Living Arrangements/Services Living arrangements for the past 2 months: Single Family Home Lives with:: Self Patient language and need for interpreter reviewed:: No Do you feel safe going back to the place where you live?: Yes      Need for Family Participation in Patient Care: Yes (Comment) Care giver support system in place?: Yes (comment)   Criminal Activity/Legal Involvement Pertinent to Current Situation/Hospitalization: No - Comment as needed  Activities of Daily Living   ADL Screening (condition at time of admission) Independently performs ADLs?: No Does the patient have a NEW difficulty with  bathing/dressing/toileting/self-feeding that is expected to last >3 days?: No Does the patient have a NEW difficulty with getting in/out of bed, walking, or climbing stairs that is expected to last >3 days?: Yes (Initiates electronic notice to provider for possible PT consult) Does the patient have a NEW difficulty with communication that is expected to last >3 days?: No Is the patient deaf or have difficulty hearing?: No Does the patient have difficulty seeing, even when wearing glasses/contacts?: No Does the patient have difficulty concentrating, remembering, or making decisions?: No  Permission Sought/Granted Permission sought to share information with : Family Supports Permission granted to share information with : Yes, Verbal Permission Granted  Share Information with NAME: Dayla Eva  Permission granted to share info w AGENCY: SNF  Permission granted to share info w Relationship: Sister     Emotional Assessment Appearance:: Appears stated age Attitude/Demeanor/Rapport: Engaged Affect (typically observed): Appropriate Orientation: : Oriented to Self, Oriented to Place, Oriented to  Time, Oriented to Situation Alcohol / Substance Use: Not Applicable    Admission diagnosis:  AKI (acute kidney injury) (HCC) [N17.9] Patient Active Problem List   Diagnosis Date Noted   Diarrhea 10/28/2023   Acute kidney injury superimposed on chronic kidney disease (HCC) 10/28/2023   CVA (cerebral vascular accident) (HCC) 10/28/2023   Non-insulin treated type 2 diabetes mellitus (HCC) 10/28/2023   Generalized weakness 10/28/2023   Hyperlipidemia 10/28/2023   Nonhealing pressure ulcer of the left lower extremity 10/28/2023   Anemia of chronic disease 10/28/2023   Gout  10/28/2023   AKI (acute kidney injury) (HCC) 10/28/2023   Morbid obesity with BMI of 70 and over, adult (HCC) 10/28/2023   Chronic acquired lymphedema 12/17/2013   Difficulty walking 12/17/2013   PCP:  Jearldine Mina, MD Pharmacy:    [facility] [phone] Jonette Nestle, [place] - [address] [place] [address] Phone: [phone] Fax: [phone]     Social Drivers of Health (SDOH) Social History: SDOH Screenings   Food Insecurity: No Food Insecurity (10/28/2023)  Housing: Low Risk  (10/28/2023)  Transportation Needs: No Transportation Needs (10/28/2023)  Utilities: Not At Risk (10/28/2023)  Tobacco Use: Unknown (10/28/2023)   SDOH Interventions:     Readmission Risk Interventions     No data to display

## 2023-10-30 NOTE — Plan of Care (Signed)

## 2023-10-31 DIAGNOSIS — N189 Chronic kidney disease, unspecified: Secondary | ICD-10-CM | POA: Diagnosis not present

## 2023-10-31 DIAGNOSIS — N179 Acute kidney failure, unspecified: Secondary | ICD-10-CM | POA: Diagnosis not present

## 2023-10-31 DIAGNOSIS — R197 Diarrhea, unspecified: Secondary | ICD-10-CM | POA: Diagnosis not present

## 2023-10-31 LAB — CBC
HCT: 30.5 % — ABNORMAL LOW (ref 36.0–46.0)
Hemoglobin: 10.4 g/dL — ABNORMAL LOW (ref 12.0–15.0)
MCH: 25.9 pg — ABNORMAL LOW (ref 26.0–34.0)
MCHC: 34.1 g/dL (ref 30.0–36.0)
MCV: 75.9 fL — ABNORMAL LOW (ref 80.0–100.0)
Platelets: 129 10*3/uL — ABNORMAL LOW (ref 150–400)
RBC: 4.02 MIL/uL (ref 3.87–5.11)
RDW: 21.7 % — ABNORMAL HIGH (ref 11.5–15.5)
WBC: 5.4 10*3/uL (ref 4.0–10.5)
nRBC: 0.4 % — ABNORMAL HIGH (ref 0.0–0.2)

## 2023-10-31 LAB — COMPREHENSIVE METABOLIC PANEL WITH GFR
ALT: 98 U/L — ABNORMAL HIGH (ref 0–44)
AST: 85 U/L — ABNORMAL HIGH (ref 15–41)
Albumin: 2.9 g/dL — ABNORMAL LOW (ref 3.5–5.0)
Alkaline Phosphatase: 96 U/L (ref 38–126)
Anion gap: 8 (ref 5–15)
BUN: 17 mg/dL (ref 8–23)
CO2: 25 mmol/L (ref 22–32)
Calcium: 9.3 mg/dL (ref 8.9–10.3)
Chloride: 103 mmol/L (ref 98–111)
Creatinine, Ser: 1.33 mg/dL — ABNORMAL HIGH (ref 0.44–1.00)
GFR, Estimated: 41 mL/min — ABNORMAL LOW (ref >60)
Glucose, Bld: 101 mg/dL — ABNORMAL HIGH (ref 70–99)
Potassium: 3.8 mmol/L (ref 3.5–5.1)
Sodium: 136 mmol/L (ref 135–145)
Total Bilirubin: 0.9 mg/dL (ref 0.0–1.2)
Total Protein: 5.9 g/dL — ABNORMAL LOW (ref 6.5–8.1)

## 2023-10-31 LAB — GLUCOSE, CAPILLARY
Glucose-Capillary: 110 mg/dL — ABNORMAL HIGH (ref 70–99)
Glucose-Capillary: 189 mg/dL — ABNORMAL HIGH (ref 70–99)
Glucose-Capillary: 117 mg/dL — ABNORMAL HIGH (ref 70–99)

## 2023-10-31 NOTE — Progress Notes (Signed)
 Occupational Therapy Treatment Patient Details Name: TONNETTE ZWIEBEL MRN: [id number] DOB: Sep 14, 1945 Today's Date: 10/31/2023   History of present illness TERITA HEJL is a 78 y.o. 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  Family reported patient is also becoming more weak and generalized dizziness.  Patient reporting that she has loose stool dark-colored for last 2 weeks.  She today she went to see wound care clinic found to have weakness with ambulation and dizziness and felt like the right leg not working well.   OT comments  Pt making progress with functional goals. Pt seated in recliner upon arrival and states that she is feeling better. Pt performed sit - stand from recliner max A, mod A SPT to BSC, mod A sit - stand from [facility]. Pt required increased time and momentum to power up during transitions. Pt also participated in UB dressing tasks min A, grooming/hygiene tasks sup with set up seated in chair. OT will continue to follow acutely to maximize level of function and safety      If plan is discharge home, recommend the following:  A lot of help with bathing/dressing/bathroom;Two people to help with walking and/or transfers;Help with stairs or ramp for entrance   Equipment Recommendations  None recommended by OT    Recommendations for Other Services      Precautions / Restrictions Precautions Precautions: Fall Recall of Precautions/Restrictions: Intact Restrictions Weight Bearing Restrictions Per Provider Order: No       Mobility Bed Mobility               General bed mobility comments: pt in recliner upon arrival    Transfers Overall transfer level: Needs  assistance Equipment used: Rolling walker (2 wheels) Transfers: Sit to/from Stand, Bed to chair/wheelchair/BSC Sit to Stand: Max assist, Mod assist Stand pivot transfers: Mod assist         General transfer comment: sit - stand from recliner max A     Balance Overall balance assessment: Needs assistance Sitting-balance support: Feet supported Sitting balance-Leahy Scale: Fair     Standing balance support: Bilateral upper extremity supported, During functional activity, Reliant on assistive device for balance                               ADL either performed or assessed with clinical judgement   ADL Overall ADL's : Needs assistance/impaired     Grooming: Wash/dry hands;Wash/dry face;Set up;Supervision/safety;Sitting           Upper Body Dressing : Minimal assistance;Sitting       Toilet Transfer: Maximal assistance;Moderate assistance;Cueing for safety;Rolling walker (2 wheels);Stand-pivot;BSC/3in1   Toileting- Architect and Hygiene: Total assistance;Sit to/from stand       Functional mobility during ADLs: Maximal assistance;Moderate assistance;Rolling walker (2 wheels);Cueing for safety General ADL Comments: sit - stand from recliner max A, mod A SPT to BSC, mod A sit - stand from [facility]. Pt required increased time and momentum to power up during transitions    Extremity/Trunk Assessment Upper Extremity Assessment Upper Extremity Assessment: Generalized weakness;Right hand dominant;LUE deficits/detail LUE Deficits / Details: hx of wekaness and impaired AROM since CVA in 2007   Lower Extremity Assessment Lower Extremity Assessment: Defer  to PT evaluation        Vision Baseline Vision/History: 1 Wears glasses Ability to See in Adequate Light: 0 Adequate Patient Visual Report: No change from baseline     Perception     Praxis     Communication Communication Communication: No apparent difficulties   Cognition Arousal:  Alert Behavior During Therapy: WFL for tasks assessed/performed Cognition: No apparent impairments                               Following commands: Intact        Cueing   Cueing Techniques: Verbal cues  Exercises      Shoulder Instructions       General Comments      Pertinent Vitals/ Pain       Pain Assessment Pain Assessment: No/denies pain  Home Living                                          Prior Functioning/Environment              Frequency  Min 2X/week        Progress Toward Goals  OT Goals(current goals can now be found in the care plan section)  Progress towards OT goals: Progressing toward goals     Plan      Co-evaluation                 AM-PAC OT "6 Clicks" Daily Activity     Outcome Measure   Help from another person eating meals?: None Help from another person taking care of personal grooming?: A Little Help from another person toileting, which includes using toliet, bedpan, or urinal?: Total Help from another person bathing (including washing, rinsing, drying)?: A Lot Help from another person to put on and taking off regular upper body clothing?: A Little Help from another person to put on and taking off regular lower body clothing?: Total 6 Click Score: 14    End of Session Equipment Utilized During Treatment: Gait belt;Other (comment) (BSC)  OT Visit Diagnosis: Other abnormalities of gait and mobility (R26.89);Muscle weakness (generalized) (M62.81)   Activity Tolerance Patient tolerated treatment well   Patient Left in chair;with call bell/phone within reach   Nurse Communication          Time: 9147-8295 OT Time Calculation (min): 19 min  Charges: OT General Charges $OT Visit: 1 Visit OT Treatments $Self Care/Home Management : 8-22 mins    Alfred Ann 10/31/2023, 1:12 PM

## 2023-10-31 NOTE — Progress Notes (Signed)
  Progress Note   Patient: Laurie Hunter QMV:[id number] DOB: Apr 05, 1946 DOA: 10/28/2023     0 DOS: the patient was seen and examined on 10/31/2023   Brief hospital course: 77 year old with a history of CVA with residual left-sided weakness, DM2, HTN, CKD stage III, anemia of chronic disease, HLD, and gout who presented to the ER 4/14 with a 2-week history of persistent unrelenting diarrhea.  This became associated with severe generalized weakness and dizziness.  In the ER she was found to have a creatinine of 2.13 with no baseline available.  Hemoglobin was 12.5 with normal WBC.  She was guaiac negative.    Assessment and Plan: Persistent acute diarrhea x 2 weeks -resolved. No further episodes -removed contact isolation   AKI on CKD stage IIIa crt 2.19 at presentation -no recent blood work to give baseline -creatinine was 0.96 with GFR of 59 in 2012 -renal function improved with hydration   Transaminitis Likely mild shock liver in setting of dehydration and hypotension -acute hepatitis panel unrevealing - holding Lipitor   Hypokalemia Due to increased GI loss -normalized   Severe generalized weakness Due to above SNF placement pending   History of CVA with chronic left-sided weakness Continue aspirin and Plavix - holding Lipitor for now due to transaminitis   DM2 Metformin on hold due to above - CBG presently controlled - A1c 5.5   HLD Lipitor on hold due to above   Microcytic anemia -anemia of chronic disease Unknown recent hemoglobin baseline -hemoglobin 12 at presentation and decreasing as expected with volume expansion -Hemodynamically stable   Gout Was on allopurinol 100 mg daily prior to admission -on hold for now due to AKI   HTN Holding home blood pressure medications in setting of dehydration   Morbid obesity - Body mass index is 45.64 kg/m.   Chronic bilateral lower extremity lymphedema   Subjective: Without complaints today  Physical Exam: Vitals:    10/30/23 1754 10/30/23 1954 10/31/23 0753 10/31/23 1622  BP: (!) 129/103 (!) 162/75 134/89 (!) 147/73  Pulse: 74 65 81 80  Resp: 18 18 19 19   Temp: 98.4 F (36.9 C) 98.2 F (36.8 C)    TempSrc:  Oral    SpO2: 100% 100% 100% 100%  Weight:      Height:       General exam: Conversant, in no acute distress Respiratory system: normal chest rise, clear, no audible wheezing Cardiovascular system: regular rhythm, s1-s2 Gastrointestinal system: Nondistended, nontender, pos BS Central nervous system: No seizures, no tremors Extremities: No cyanosis, no joint deformities Skin: No rashes, no pallor Psychiatry: Affect normal // no auditory hallucinations   Data Reviewed:  Labs reviewed: Na 136, K 3.8, Cr 1.33, WBC 5.4, Hgb 10.4, Plts 129  Family Communication: Pt in room, family not at bedside  Disposition: Status is: Observation The patient remains OBS appropriate and will d/c before 2 midnights.  Planned Discharge Destination: Skilled nursing facility     Author: Cherylle Corwin, MD 10/31/2023 6:20 PM  For on call review [email or web address].

## 2023-10-31 NOTE — Progress Notes (Signed)
 Physical Therapy Treatment Patient Details Name: Laurie Hunter MRN: [id number] DOB: 1945-11-12 Today's Date: 10/31/2023   History of Present Illness Laurie Hunter is a 78 y.o. 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  Family reported patient is also becoming more weak and generalized dizziness.  Patient reporting that she has loose stool dark-colored for last 2 weeks.  She today she went to see wound care clinic found to have weakness with ambulation and dizziness and felt like the right leg not working well.    PT Comments  Pt up in chair on arrival and eager for mobility. Pt demonstrating continued progress towards acute goals this session able to progress short gait with min A to steady with personal RW for support and +2 chair follow for safety as pt fatigues very quickly. Pt requiring mod A to boost to stand from recliner with increased time to complete. Pt continues to be limited in safe mobility my significantly decreased activity tolerance, poor balance/postural reactions and global weakness and  will benefit from continued inpatient follow up therapy, <3 hours/day. Pt continues to benefit from skilled PT services to progress toward functional mobility goals.      If plan is discharge home, recommend the following: Two people to help with walking and/or transfers;Two people to help with bathing/dressing/bathroom   Can travel by private vehicle     No  Equipment Recommendations  None recommended by PT    Recommendations for Other Services       Precautions / Restrictions Precautions Precautions: Fall Recall of Precautions/Restrictions: Intact Restrictions Weight  Bearing Restrictions Per Provider Order: No     Mobility  Bed Mobility Overal bed mobility: Needs Assistance             General bed mobility comments: pt in recliner upon arrival    Transfers Overall transfer level: Needs assistance Equipment used: Rolling walker (2 wheels) Transfers: Sit to/from Stand, Bed to chair/wheelchair/BSC Sit to Stand: Mod assist           General transfer comment: mod A to boost to stand from recliner, hands on assist to place L foot in optimal position for rise to stand, very flexed trunk on rise    Ambulation/Gait Ambulation/Gait assistance: Min assist, +2 safety/equipment Gait Distance (Feet): 20 Feet Assistive device: Rolling walker (2 wheels) (personal RW) Gait Pattern/deviations: Step-through pattern, Decreased stride length, Shuffle, Trunk flexed, Wide base of support Gait velocity: decr     General Gait Details: shuffling gait with wide BOS and very flexed trunk, unable to elevate trunk with cues, chair follow for safety with pt fatiguing quickly   Stairs             Wheelchair Mobility     Tilt Bed    Modified Rankin (Stroke Patients Only)       Balance Overall balance assessment: Needs assistance Sitting-balance support: Feet supported Sitting balance-Leahy Scale: Fair     Standing balance support: Bilateral upper extremity supported, During functional activity, Reliant on assistive device for balance Standing balance-Leahy Scale: Poor Standing balance comment: heavy reliance on RW supprot  Communication Communication Communication: No apparent difficulties  Cognition Arousal: Alert Behavior During Therapy: WFL for tasks assessed/performed   PT - Cognitive impairments: No apparent impairments                         Following commands: Intact      Cueing Cueing Techniques: Verbal cues  Exercises      General Comments        Pertinent Vitals/Pain  Pain Assessment Pain Assessment: No/denies pain    Home Living                          Prior Function            PT Goals (current goals can now be found in the care plan section) Acute Rehab PT Goals Patient Stated Goal: to get stronger and be able to go home PT Goal Formulation: With patient Time For Goal Achievement: 11/12/23 Progress towards PT goals: Progressing toward goals    Frequency    Min 3X/week      PT Plan      Co-evaluation              AM-PAC PT "6 Clicks" Mobility   Outcome Measure  Help needed turning from your back to your side while in a flat bed without using bedrails?: Total Help needed moving from lying on your back to sitting on the side of a flat bed without using bedrails?: Total Help needed moving to and from a bed to a chair (including a wheelchair)?: Total Help needed standing up from a chair using your arms (e.g., wheelchair or bedside chair)?: A Lot Help needed to walk in hospital room?: Total Help needed climbing 3-5 steps with a railing? : Total 6 Click Score: 7    End of Session Equipment Utilized During Treatment: Gait belt Activity Tolerance: Patient limited by fatigue Patient left: in chair;with call bell/phone within reach Nurse Communication: Mobility status PT Visit Diagnosis: Other abnormalities of gait and mobility (R26.89);Muscle weakness (generalized) (M62.81);Unsteadiness on feet (R26.81);Difficulty in walking, not elsewhere classified (R26.2);Hemiplegia and hemiparesis Hemiplegia - Right/Left: Left Hemiplegia - dominant/non-dominant: Non-dominant Hemiplegia - caused by: Cerebral infarction     Time: 1610-9604 PT Time Calculation (min) (ACUTE ONLY): 16 min  Charges:    $Gait Training: 8-22 mins PT General Charges $$ ACUTE PT VISIT: 1 Visit                     Briston Lax R. PTA Acute Rehabilitation Services Office: [phone]   Agapito Horseman 10/31/2023, 2:29 PM

## 2023-10-31 NOTE — TOC Progression Note (Signed)
 Transition of Care [facility]) - Progression Note    Patient Details  Name: Laurie Hunter MRN: [id number] Date of Birth: Feb 12, 1946  [facility]) CM/SW Contact  Arron Big, [place] Phone Number: 10/31/2023, 4:12 PM  Clinical Narrative:   Insurance auth approved 11/01/2023-11/05/2023 for [facility]. Auth id [id number].  TOC will continue to follow.   Expected Discharge Plan: Skilled Nursing Facility Barriers to Discharge: Continued Medical Work up, English as a second language teacher  Expected Discharge Plan and Services     Post Acute Care Choice: Skilled Nursing Facility Living arrangements for the past 2 months: Single Family Home                                       Social Determinants of Health (SDOH) Interventions SDOH Screenings   Food Insecurity: No Food Insecurity (10/28/2023)  Housing: Low Risk  (10/28/2023)  Transportation Needs: No Transportation Needs (10/28/2023)  Utilities: Not At Risk (10/28/2023)  Tobacco Use: Unknown (10/28/2023)    Readmission Risk Interventions     No data to display

## 2023-11-01 DIAGNOSIS — M1A9XX0 Chronic gout, unspecified, without tophus (tophi): Secondary | ICD-10-CM | POA: Diagnosis not present

## 2023-11-01 DIAGNOSIS — D509 Iron deficiency anemia, unspecified: Secondary | ICD-10-CM | POA: Diagnosis not present

## 2023-11-01 DIAGNOSIS — I129 Hypertensive chronic kidney disease with stage 1 through stage 4 chronic kidney disease, or unspecified chronic kidney disease: Secondary | ICD-10-CM | POA: Diagnosis not present

## 2023-11-01 DIAGNOSIS — Z9181 History of falling: Secondary | ICD-10-CM | POA: Diagnosis not present

## 2023-11-01 DIAGNOSIS — H6123 Impacted cerumen, bilateral: Secondary | ICD-10-CM | POA: Diagnosis not present

## 2023-11-01 DIAGNOSIS — Z79899 Other long term (current) drug therapy: Secondary | ICD-10-CM | POA: Diagnosis not present

## 2023-11-01 DIAGNOSIS — L97929 Non-pressure chronic ulcer of unspecified part of left lower leg with unspecified severity: Secondary | ICD-10-CM | POA: Diagnosis not present

## 2023-11-01 DIAGNOSIS — I890 Lymphedema, not elsewhere classified: Secondary | ICD-10-CM | POA: Diagnosis not present

## 2023-11-01 DIAGNOSIS — G4700 Insomnia, unspecified: Secondary | ICD-10-CM | POA: Diagnosis not present

## 2023-11-01 DIAGNOSIS — Z7401 Bed confinement status: Secondary | ICD-10-CM | POA: Diagnosis not present

## 2023-11-01 DIAGNOSIS — E782 Mixed hyperlipidemia: Secondary | ICD-10-CM | POA: Diagnosis not present

## 2023-11-01 DIAGNOSIS — D638 Anemia in other chronic diseases classified elsewhere: Secondary | ICD-10-CM | POA: Diagnosis not present

## 2023-11-01 DIAGNOSIS — Z90710 Acquired absence of both cervix and uterus: Secondary | ICD-10-CM | POA: Diagnosis not present

## 2023-11-01 DIAGNOSIS — I69354 Hemiplegia and hemiparesis following cerebral infarction affecting left non-dominant side: Secondary | ICD-10-CM | POA: Diagnosis not present

## 2023-11-01 DIAGNOSIS — M810 Age-related osteoporosis without current pathological fracture: Secondary | ICD-10-CM | POA: Diagnosis not present

## 2023-11-01 DIAGNOSIS — E785 Hyperlipidemia, unspecified: Secondary | ICD-10-CM | POA: Diagnosis not present

## 2023-11-01 DIAGNOSIS — R197 Diarrhea, unspecified: Secondary | ICD-10-CM | POA: Diagnosis not present

## 2023-11-01 DIAGNOSIS — I639 Cerebral infarction, unspecified: Secondary | ICD-10-CM | POA: Diagnosis not present

## 2023-11-01 DIAGNOSIS — N179 Acute kidney failure, unspecified: Secondary | ICD-10-CM | POA: Diagnosis not present

## 2023-11-01 DIAGNOSIS — I6930 Unspecified sequelae of cerebral infarction: Secondary | ICD-10-CM | POA: Diagnosis not present

## 2023-11-01 DIAGNOSIS — Z8673 Personal history of transient ischemic attack (TIA), and cerebral infarction without residual deficits: Secondary | ICD-10-CM | POA: Diagnosis not present

## 2023-11-01 DIAGNOSIS — Z6841 Body Mass Index (BMI) 40.0 and over, adult: Secondary | ICD-10-CM | POA: Diagnosis not present

## 2023-11-01 DIAGNOSIS — N183 Chronic kidney disease, stage 3 unspecified: Secondary | ICD-10-CM | POA: Diagnosis not present

## 2023-11-01 DIAGNOSIS — R062 Wheezing: Secondary | ICD-10-CM | POA: Diagnosis not present

## 2023-11-01 DIAGNOSIS — R262 Difficulty in walking, not elsewhere classified: Secondary | ICD-10-CM | POA: Diagnosis not present

## 2023-11-01 DIAGNOSIS — Z7902 Long term (current) use of antithrombotics/antiplatelets: Secondary | ICD-10-CM | POA: Diagnosis not present

## 2023-11-01 DIAGNOSIS — Z7189 Other specified counseling: Secondary | ICD-10-CM | POA: Diagnosis not present

## 2023-11-01 DIAGNOSIS — N182 Chronic kidney disease, stage 2 (mild): Secondary | ICD-10-CM | POA: Diagnosis not present

## 2023-11-01 DIAGNOSIS — Z7409 Other reduced mobility: Secondary | ICD-10-CM | POA: Diagnosis not present

## 2023-11-01 DIAGNOSIS — E876 Hypokalemia: Secondary | ICD-10-CM | POA: Diagnosis not present

## 2023-11-01 DIAGNOSIS — M6281 Muscle weakness (generalized): Secondary | ICD-10-CM | POA: Diagnosis not present

## 2023-11-01 DIAGNOSIS — M21372 Foot drop, left foot: Secondary | ICD-10-CM | POA: Diagnosis not present

## 2023-11-01 DIAGNOSIS — T7840XD Allergy, unspecified, subsequent encounter: Secondary | ICD-10-CM | POA: Diagnosis not present

## 2023-11-01 DIAGNOSIS — E119 Type 2 diabetes mellitus without complications: Secondary | ICD-10-CM | POA: Diagnosis not present

## 2023-11-01 DIAGNOSIS — E8809 Other disorders of plasma-protein metabolism, not elsewhere classified: Secondary | ICD-10-CM | POA: Diagnosis not present

## 2023-11-01 DIAGNOSIS — Z7984 Long term (current) use of oral hypoglycemic drugs: Secondary | ICD-10-CM | POA: Diagnosis not present

## 2023-11-01 DIAGNOSIS — R531 Weakness: Secondary | ICD-10-CM | POA: Diagnosis not present

## 2023-11-01 DIAGNOSIS — K219 Gastro-esophageal reflux disease without esophagitis: Secondary | ICD-10-CM | POA: Diagnosis not present

## 2023-11-01 DIAGNOSIS — G894 Chronic pain syndrome: Secondary | ICD-10-CM | POA: Diagnosis not present

## 2023-11-01 DIAGNOSIS — H2513 Age-related nuclear cataract, bilateral: Secondary | ICD-10-CM | POA: Diagnosis not present

## 2023-11-01 DIAGNOSIS — M109 Gout, unspecified: Secondary | ICD-10-CM | POA: Diagnosis not present

## 2023-11-01 DIAGNOSIS — I69359 Hemiplegia and hemiparesis following cerebral infarction affecting unspecified side: Secondary | ICD-10-CM | POA: Diagnosis not present

## 2023-11-01 DIAGNOSIS — E1122 Type 2 diabetes mellitus with diabetic chronic kidney disease: Secondary | ICD-10-CM | POA: Diagnosis not present

## 2023-11-01 DIAGNOSIS — D631 Anemia in chronic kidney disease: Secondary | ICD-10-CM | POA: Diagnosis not present

## 2023-11-01 DIAGNOSIS — R7401 Elevation of levels of liver transaminase levels: Secondary | ICD-10-CM | POA: Diagnosis not present

## 2023-11-01 DIAGNOSIS — N189 Chronic kidney disease, unspecified: Secondary | ICD-10-CM | POA: Diagnosis not present

## 2023-11-01 DIAGNOSIS — I10 Essential (primary) hypertension: Secondary | ICD-10-CM | POA: Diagnosis not present

## 2023-11-01 LAB — GLUCOSE, CAPILLARY
Glucose-Capillary: 98 mg/dL (ref 70–99)
Glucose-Capillary: 183 mg/dL — ABNORMAL HIGH (ref 70–99)

## 2023-11-01 MED ORDER — CARBAMIDE PEROXIDE 6.5 % OT SOLN: 5.0000 [drp] | Freq: Two times a day (BID) | OTIC | Status: AC

## 2023-11-01 MED ORDER — CARBAMIDE PEROXIDE 6.5 % OT SOLN
5.0000 [drp] | Freq: Two times a day (BID) | OTIC | Status: DC
  Administered 2023-11-01: 5 [drp] via OTIC
  Filled 2023-11-01: qty 15

## 2023-11-01 NOTE — Progress Notes (Signed)
 [facility] here to pick up pt. Axox4, moved to gurney with one assist... report called to [facility] LPN at [facility]

## 2023-11-01 NOTE — Discharge Summary (Signed)
 Physician Discharge Summary   Patient: Laurie Hunter MRN: [id number] DOB: 1945-10-20  Admit date:     10/28/2023  Discharge date: 11/01/23  Discharge Physician: Cherylle Corwin   PCP: Jearldine Mina, MD   Recommendations at discharge:    Follow up with PCP in 1-2 weeks Recommend repeat LFT in 1-2 weeks and consider resuming statin if LFT's improve  Discharge Diagnoses: Principal Problem:   Acute kidney injury superimposed on chronic kidney disease (HCC) Active Problems:   Diarrhea   Generalized weakness   Chronic acquired lymphedema   Difficulty walking   CVA (cerebral vascular accident) (HCC)   Non-insulin  treated type 2 diabetes mellitus (HCC)   Hyperlipidemia   Nonhealing pressure ulcer of the left lower extremity   Anemia of chronic disease   Gout   AKI (acute kidney injury) (HCC)   Morbid obesity with BMI of 70 and over, adult [facility])  Resolved Problems:   * No resolved hospital problems. [facility] Course: 78 year old with a history of CVA with residual left-sided weakness, DM2, HTN, CKD stage III, anemia of chronic disease, HLD, and gout who presented to the ER 4/14 with a 2-week history of persistent unrelenting diarrhea.  This became associated with severe generalized weakness and dizziness.  In the ER she was found to have a creatinine of 2.13 with no baseline available.  Hemoglobin was 12.5 with normal WBC.  She was guaiac negative.    Assessment and Plan: Persistent acute diarrhea x 2 weeks -resolved. No further episodes -removed contact isolation   AKI on CKD stage IIIa crt 2.19 at presentation -no recent blood work to give baseline -creatinine was 0.96 with GFR of 59 in 2012 -renal function improved with hydration -continuing to hold hydrochlorothiazide and ARB on d/c   Transaminitis Likely mild shock liver in setting of dehydration and hypotension -acute hepatitis panel unrevealing  -Recommend recheck LFT in 1-2 weeks and consider resuming statin if LFT's  improve   Hypokalemia Due to increased GI loss -normalized   Severe generalized weakness Due to above SNF placement    History of CVA with chronic left-sided weakness Continue aspirin  and Plavix  - holding Lipitor for now due to transaminitis per above   DM2 Metformin on hold due to above - CBG presently controlled - A1c 5.5   HLD Lipitor on hold due to above   Microcytic anemia -anemia of chronic disease Unknown recent hemoglobin baseline -hemoglobin 12 at presentation and decreasing as expected with volume expansion -Hemodynamically stable   Gout Was on allopurinol  100 mg daily prior to admission -initially held due to AKI   HTN Initially held home blood pressure medications in setting of dehydration Cont beta blocker on d/c Hydrochlorothiazide and ARB held secondary to above ARF, consider resuming down the road as renal function allows   Morbid obesity - Body mass index is 45.64 kg/m.   Chronic bilateral lower extremity lymphedema       Consultants:  Procedures performed:   Disposition: Skilled nursing facility Diet recommendation:  Cardiac and Carb modified diet DISCHARGE MEDICATION: Allergies as of 11/01/2023       Reactions   Erythromycin Swelling   Lisinopril Swelling   Penicillins Swelling        Medication List     STOP taking these medications    atorvastatin  20 MG tablet Commonly known as: LIPITOR   hydrochlorothiazide 25 MG tablet Commonly known as: HYDRODIURIL   losartan 25 MG tablet Commonly known as: COZAAR   potassium chloride  10  MEQ tablet Commonly known as: KLOR-CON        TAKE these medications    acetaminophen  500 MG tablet Commonly known as: TYLENOL  Take 500-1,000 mg by mouth every 6 (six) hours as needed for mild pain (pain score 1-3) or moderate pain (pain score 4-6).   allopurinol  100 MG tablet Commonly known as: ZYLOPRIM  Take 100 mg by mouth in the morning.   amLODipine  10 MG tablet Commonly known as:  NORVASC  Take 10 mg by mouth at bedtime.   aspirin  81 MG tablet Take 81 mg by mouth in the morning.   atenolol 50 MG tablet Commonly known as: TENORMIN Take 50 mg by mouth in the morning.   bismuth  subsalicylate 262 MG chewable tablet Commonly known as: PEPTO BISMOL Chew 524 mg by mouth as needed for indigestion or diarrhea or loose stools.   KAOPECTATE PO Take 1-2 tablets by mouth as needed.   clopidogrel  75 MG tablet Commonly known as: PLAVIX  Take 75 mg by mouth in the morning.   colchicine 0.6 MG tablet Take 0.6 mg by mouth 2 (two) times daily.   diphenhydrAMINE  25 MG tablet Commonly known as: BENADRYL  Take 50 mg by mouth at bedtime.   famotidine  20 MG tablet Commonly known as: Pepcid  Take 1 tablet (20 mg total) by mouth 2 (two) times daily.   FeroSul 325 (65 Fe) MG tablet Generic drug: ferrous sulfate  Take 325 mg by mouth in the morning.   IMODIUM PO Take 1 tablet by mouth at bedtime as needed.   melatonin 5 MG Tabs Take 5 mg by mouth at bedtime as needed.   metFORMIN 500 MG tablet Commonly known as: GLUCOPHAGE Take 500 mg by mouth in the morning.        Contact information for after-discharge care     Destination     [facility] Preferred SNF .   Service: Skilled Nursing Contact information: [address] [place]  [address] [phone]                    Discharge Exam: Cleavon Curls Weights   10/28/23 1356 10/28/23 2156  Weight: 108.9 kg 102.5 kg   General exam: Awake, laying in bed, in nad Respiratory system: Normal respiratory effort, no wheezing Cardiovascular system: regular rate, s1, s2 Gastrointestinal system: Soft, nondistended, positive BS Central nervous system: CN2-12 grossly intact, strength intact Extremities: Perfused, no clubbing Skin: Normal skin turgor, no notable skin lesions seen Psychiatry: Mood normal // no visual hallucinations   Condition at discharge: fair  The results of  significant diagnostics from this hospitalization (including imaging, microbiology, ancillary and laboratory) are listed below for reference.   Imaging Studies: US  RENAL Result Date: 10/28/2023 CLINICAL DATA:  Acute renal injury EXAM: RENAL / URINARY TRACT ULTRASOUND COMPLETE COMPARISON:  None Available. FINDINGS: Right Kidney: Renal measurements: 6.9 x 4.0 x 4.2 cm. = volume: 116 mL. 7 mm cyst is noted within the upper pole of the right kidney. Increased echogenicity is noted. Left Kidney: Renal measurements: 9.2 x 4.6 x 5.8 cm. = volume: 126 mL. Exophytic 2.2 cm simple cyst is noted. No mass lesion or hydronephrosis is noted. Bladder: Appears normal for degree of bladder distention. Other: None. IMPRESSION: Bilateral renal cystic change is noted. No follow-up is recommended. Mild increased echogenicity in the right kidney is noted. Electronically Signed   By: Violeta Grey M.D.   On: 10/28/2023 22:50    Microbiology: Results for orders placed or performed during the hospital encounter of 06/30/10  Urine culture     Status: None   Collection Time: 06/30/10  7:15 PM   Specimen: Urine, Catheterized  Result Value Ref Range Status   Specimen Description URINE, CATHETERIZED  Final   Special Requests NONE  Final   Culture  Setup Time [id number]  Final   Colony Count >=100,000 COLONIES/ML  Final   Culture ESCHERICHIA COLI  Final   Report Status 07/03/2010 FINAL  Final   Organism ID, Bacteria ESCHERICHIA COLI  Final      Susceptibility   Escherichia coli - MIC    AMPICILLIN <=2 Sensitive     CEFAZOLIN <=4 Sensitive     CEFTRIAXONE <=1 Sensitive     CIPROFLOXACIN <=0.25 Sensitive     GENTAMICIN <=1 Sensitive     LEVOFLOXACIN <=0.12 Sensitive     NITROFURANTOIN 32 Sensitive     TOBRAMYCIN <=1 Sensitive     TRIMETH/SULFA <=20 Sensitive   Urine culture     Status: None   Collection Time: 07/10/10  7:45 PM   Specimen: Urine, Catheterized  Result Value Ref Range Status   Specimen Description  URINE, CATHETERIZED  Final   Special Requests NONE  Final   Culture  Setup Time [id number]  Final   Colony Count NO GROWTH  Final   Culture NO GROWTH  Final   Report Status 07/12/2010 FINAL  Final  Urine culture     Status: None   Collection Time: 07/24/10  2:20 PM   Specimen: Urine, Random  Result Value Ref Range Status   Specimen Description URINE, RANDOM  Final   Special Requests NONE  Final   Culture  Setup Time [id number]  Final   Colony Count NO GROWTH  Final   Culture NO GROWTH  Final   Report Status 07/25/2010 FINAL  Final    Labs: CBC: Recent Labs  Lab 10/28/23 1411 10/29/23 0506 10/30/23 0451 10/31/23 0441  WBC 5.9 5.0 5.3 5.4  HGB 12.3 10.8* 10.3* 10.4*  HCT 37.4 32.0* 31.0* 30.5*  MCV 77.3* 76.0* 76.2* 75.9*  PLT 162 137* 132* 129*   Basic Metabolic Panel: Recent Labs  Lab 10/28/23 1411 10/29/23 0506 10/30/23 0451 10/31/23 0441  NA 138 139 137 136  K 4.7 3.2* 3.7 3.8  CL 102 105 106 103  CO2 24 25 24 25   GLUCOSE 86 75 85 101*  BUN 27* 19 14 17   CREATININE 2.13* 1.56* 1.29* 1.33*  CALCIUM  10.0 9.1 8.8* 9.3  MG  --   --  1.4*  --   PHOS  --   --  2.9  --    Liver Function Tests: Recent Labs  Lab 10/28/23 1411 10/29/23 0506 10/30/23 0451 10/31/23 0441  AST 92* 78* 68* 85*  ALT 83* 73* 70* 98*  ALKPHOS 124 90 76 96  BILITOT 1.1 1.0 0.8 0.9  PROT 7.9 6.3* 5.8* 5.9*  ALBUMIN 3.9 3.1* 2.8* 2.9*   CBG: Recent Labs  Lab 10/31/23 0745 10/31/23 1156 10/31/23 1621 11/01/23 0721 11/01/23 1146  GLUCAP 110* 189* 117* 98 183*    Discharge time spent: less than 30 minutes.  Signed: Cherylle Corwin, MD [facility] 11/01/2023

## 2023-11-01 NOTE — TOC Transition Note (Signed)
 Transition of Care [facility]) - Discharge Note   Patient Details  Name: Laurie Hunter MRN: [id number] Date of Birth: [birth year]  Transition of Care [facility] CM/SW Contact:  Valery Gaucher, LCSW Phone Number: 11/01/2023, 2:44 PM   Clinical Narrative:     Patient will Discharge to: [facility] Care Discharge Date: 11/01/2023 Family Notified: sister Transport By: Lyna Sandhoff  Per MD patient is ready for discharge. RN, patient, and facility notified of discharge. Discharge Summary sent to facility. RN given number for [phone]. Ambulance transport requested for patient.   Clinical Social Worker signing off.  Liddie Reel, MSW, LCSW Clinical Social Worker     Final next level of care: Skilled Nursing Facility Barriers to Discharge: Barriers Resolved   Patient Goals and CMS Choice Patient states their goals for this hospitalization and ongoing recovery are:: To get back home CMS Medicare.gov Compare Post Acute Care list provided to:: Patient Represenative (must comment) Choice offered to / list presented to : Sibling [place] ownership interest in [facility].provided to:: Sibling    Discharge Placement              Patient chooses bed at: [facility] Patient to be transferred to facility by: [facility] Name of family member notified: sister Patient and family notified of of transfer: 11/01/23  Discharge Plan and Services Additional resources added to the After Visit Summary for       Post Acute Care Choice: Skilled Nursing Facility                               Social Drivers of Health (SDOH) Interventions SDOH Screenings   Food Insecurity: No Food Insecurity (10/28/2023)  Housing: Low Risk  (10/28/2023)  Transportation Needs: No Transportation Needs (10/28/2023)  Utilities: Not At Risk (10/28/2023)  Tobacco Use: Unknown (10/28/2023)     Readmission Risk Interventions     No data to display

## 2023-11-02 DIAGNOSIS — R197 Diarrhea, unspecified: Secondary | ICD-10-CM | POA: Diagnosis not present

## 2023-11-02 DIAGNOSIS — Z7189 Other specified counseling: Secondary | ICD-10-CM | POA: Diagnosis not present

## 2023-11-02 DIAGNOSIS — N179 Acute kidney failure, unspecified: Secondary | ICD-10-CM | POA: Diagnosis not present

## 2023-11-02 DIAGNOSIS — R531 Weakness: Secondary | ICD-10-CM | POA: Diagnosis not present

## 2023-11-03 DIAGNOSIS — H6123 Impacted cerumen, bilateral: Secondary | ICD-10-CM | POA: Diagnosis not present

## 2023-11-03 DIAGNOSIS — R531 Weakness: Secondary | ICD-10-CM | POA: Diagnosis not present

## 2023-11-03 DIAGNOSIS — Z79899 Other long term (current) drug therapy: Secondary | ICD-10-CM | POA: Diagnosis not present

## 2023-11-03 DIAGNOSIS — E119 Type 2 diabetes mellitus without complications: Secondary | ICD-10-CM | POA: Diagnosis not present

## 2023-11-04 DIAGNOSIS — H6123 Impacted cerumen, bilateral: Secondary | ICD-10-CM | POA: Diagnosis not present

## 2023-11-06 DIAGNOSIS — N179 Acute kidney failure, unspecified: Secondary | ICD-10-CM | POA: Diagnosis not present

## 2023-11-06 DIAGNOSIS — R531 Weakness: Secondary | ICD-10-CM | POA: Diagnosis not present

## 2023-11-06 DIAGNOSIS — I69359 Hemiplegia and hemiparesis following cerebral infarction affecting unspecified side: Secondary | ICD-10-CM | POA: Diagnosis not present

## 2023-11-06 DIAGNOSIS — R197 Diarrhea, unspecified: Secondary | ICD-10-CM | POA: Diagnosis not present

## 2023-11-07 DIAGNOSIS — I69354 Hemiplegia and hemiparesis following cerebral infarction affecting left non-dominant side: Secondary | ICD-10-CM | POA: Diagnosis not present

## 2023-11-07 DIAGNOSIS — N189 Chronic kidney disease, unspecified: Secondary | ICD-10-CM | POA: Diagnosis not present

## 2023-11-07 DIAGNOSIS — G4700 Insomnia, unspecified: Secondary | ICD-10-CM | POA: Diagnosis not present

## 2023-11-07 DIAGNOSIS — I890 Lymphedema, not elsewhere classified: Secondary | ICD-10-CM | POA: Diagnosis not present

## 2023-11-10 DIAGNOSIS — R197 Diarrhea, unspecified: Secondary | ICD-10-CM | POA: Diagnosis not present

## 2023-11-10 DIAGNOSIS — R531 Weakness: Secondary | ICD-10-CM | POA: Diagnosis not present

## 2023-11-10 DIAGNOSIS — I69354 Hemiplegia and hemiparesis following cerebral infarction affecting left non-dominant side: Secondary | ICD-10-CM | POA: Diagnosis not present

## 2023-11-10 DIAGNOSIS — Z7409 Other reduced mobility: Secondary | ICD-10-CM | POA: Diagnosis not present

## 2023-11-13 DIAGNOSIS — I69354 Hemiplegia and hemiparesis following cerebral infarction affecting left non-dominant side: Secondary | ICD-10-CM | POA: Diagnosis not present

## 2023-11-13 DIAGNOSIS — N182 Chronic kidney disease, stage 2 (mild): Secondary | ICD-10-CM | POA: Diagnosis not present

## 2023-11-13 DIAGNOSIS — E8809 Other disorders of plasma-protein metabolism, not elsewhere classified: Secondary | ICD-10-CM | POA: Diagnosis not present

## 2023-11-16 DIAGNOSIS — I10 Essential (primary) hypertension: Secondary | ICD-10-CM | POA: Diagnosis not present

## 2023-11-16 DIAGNOSIS — R531 Weakness: Secondary | ICD-10-CM | POA: Diagnosis not present

## 2023-11-19 DIAGNOSIS — I6930 Unspecified sequelae of cerebral infarction: Secondary | ICD-10-CM | POA: Diagnosis not present

## 2023-11-19 DIAGNOSIS — R531 Weakness: Secondary | ICD-10-CM | POA: Diagnosis not present

## 2023-11-19 DIAGNOSIS — I890 Lymphedema, not elsewhere classified: Secondary | ICD-10-CM | POA: Diagnosis not present

## 2023-11-19 DIAGNOSIS — Z7409 Other reduced mobility: Secondary | ICD-10-CM | POA: Diagnosis not present

## 2023-11-20 DIAGNOSIS — I69354 Hemiplegia and hemiparesis following cerebral infarction affecting left non-dominant side: Secondary | ICD-10-CM | POA: Diagnosis not present

## 2023-11-20 DIAGNOSIS — E8809 Other disorders of plasma-protein metabolism, not elsewhere classified: Secondary | ICD-10-CM | POA: Diagnosis not present

## 2023-11-20 DIAGNOSIS — N182 Chronic kidney disease, stage 2 (mild): Secondary | ICD-10-CM | POA: Diagnosis not present

## 2023-11-21 DIAGNOSIS — I6930 Unspecified sequelae of cerebral infarction: Secondary | ICD-10-CM | POA: Diagnosis not present

## 2023-11-21 DIAGNOSIS — I890 Lymphedema, not elsewhere classified: Secondary | ICD-10-CM | POA: Diagnosis not present

## 2023-11-21 DIAGNOSIS — Z7409 Other reduced mobility: Secondary | ICD-10-CM | POA: Diagnosis not present

## 2023-11-21 DIAGNOSIS — R531 Weakness: Secondary | ICD-10-CM | POA: Diagnosis not present

## 2023-11-22 DIAGNOSIS — I129 Hypertensive chronic kidney disease with stage 1 through stage 4 chronic kidney disease, or unspecified chronic kidney disease: Secondary | ICD-10-CM | POA: Diagnosis not present

## 2023-11-22 DIAGNOSIS — N183 Chronic kidney disease, stage 3 unspecified: Secondary | ICD-10-CM | POA: Diagnosis not present

## 2023-11-22 DIAGNOSIS — Z7984 Long term (current) use of oral hypoglycemic drugs: Secondary | ICD-10-CM | POA: Diagnosis not present

## 2023-11-22 DIAGNOSIS — D631 Anemia in chronic kidney disease: Secondary | ICD-10-CM | POA: Diagnosis not present

## 2023-11-22 DIAGNOSIS — Z7902 Long term (current) use of antithrombotics/antiplatelets: Secondary | ICD-10-CM | POA: Diagnosis not present

## 2023-11-22 DIAGNOSIS — I890 Lymphedema, not elsewhere classified: Secondary | ICD-10-CM | POA: Diagnosis not present

## 2023-11-22 DIAGNOSIS — E785 Hyperlipidemia, unspecified: Secondary | ICD-10-CM | POA: Diagnosis not present

## 2023-11-22 DIAGNOSIS — K219 Gastro-esophageal reflux disease without esophagitis: Secondary | ICD-10-CM | POA: Diagnosis not present

## 2023-11-22 DIAGNOSIS — I69354 Hemiplegia and hemiparesis following cerebral infarction affecting left non-dominant side: Secondary | ICD-10-CM | POA: Diagnosis not present

## 2023-11-22 DIAGNOSIS — H6123 Impacted cerumen, bilateral: Secondary | ICD-10-CM | POA: Diagnosis not present

## 2023-11-22 DIAGNOSIS — E1122 Type 2 diabetes mellitus with diabetic chronic kidney disease: Secondary | ICD-10-CM | POA: Diagnosis not present

## 2023-11-22 DIAGNOSIS — M109 Gout, unspecified: Secondary | ICD-10-CM | POA: Diagnosis not present

## 2023-11-22 DIAGNOSIS — Z90710 Acquired absence of both cervix and uterus: Secondary | ICD-10-CM | POA: Diagnosis not present

## 2023-11-22 DIAGNOSIS — R197 Diarrhea, unspecified: Secondary | ICD-10-CM | POA: Diagnosis not present

## 2023-11-22 DIAGNOSIS — N179 Acute kidney failure, unspecified: Secondary | ICD-10-CM | POA: Diagnosis not present

## 2023-11-22 DIAGNOSIS — Z9181 History of falling: Secondary | ICD-10-CM | POA: Diagnosis not present

## 2023-11-25 DIAGNOSIS — R197 Diarrhea, unspecified: Secondary | ICD-10-CM | POA: Diagnosis not present

## 2023-11-25 DIAGNOSIS — Z9181 History of falling: Secondary | ICD-10-CM | POA: Diagnosis not present

## 2023-11-25 DIAGNOSIS — H6123 Impacted cerumen, bilateral: Secondary | ICD-10-CM | POA: Diagnosis not present

## 2023-11-25 DIAGNOSIS — D631 Anemia in chronic kidney disease: Secondary | ICD-10-CM | POA: Diagnosis not present

## 2023-11-25 DIAGNOSIS — E1122 Type 2 diabetes mellitus with diabetic chronic kidney disease: Secondary | ICD-10-CM | POA: Diagnosis not present

## 2023-11-25 DIAGNOSIS — Z7902 Long term (current) use of antithrombotics/antiplatelets: Secondary | ICD-10-CM | POA: Diagnosis not present

## 2023-11-25 DIAGNOSIS — N183 Chronic kidney disease, stage 3 unspecified: Secondary | ICD-10-CM | POA: Diagnosis not present

## 2023-11-25 DIAGNOSIS — I129 Hypertensive chronic kidney disease with stage 1 through stage 4 chronic kidney disease, or unspecified chronic kidney disease: Secondary | ICD-10-CM | POA: Diagnosis not present

## 2023-11-25 DIAGNOSIS — M109 Gout, unspecified: Secondary | ICD-10-CM | POA: Diagnosis not present

## 2023-11-25 DIAGNOSIS — Z7984 Long term (current) use of oral hypoglycemic drugs: Secondary | ICD-10-CM | POA: Diagnosis not present

## 2023-11-25 DIAGNOSIS — E785 Hyperlipidemia, unspecified: Secondary | ICD-10-CM | POA: Diagnosis not present

## 2023-11-25 DIAGNOSIS — I69354 Hemiplegia and hemiparesis following cerebral infarction affecting left non-dominant side: Secondary | ICD-10-CM | POA: Diagnosis not present

## 2023-11-25 DIAGNOSIS — N179 Acute kidney failure, unspecified: Secondary | ICD-10-CM | POA: Diagnosis not present

## 2023-11-25 DIAGNOSIS — I890 Lymphedema, not elsewhere classified: Secondary | ICD-10-CM | POA: Diagnosis not present

## 2023-11-25 DIAGNOSIS — K219 Gastro-esophageal reflux disease without esophagitis: Secondary | ICD-10-CM | POA: Diagnosis not present

## 2023-11-25 DIAGNOSIS — Z90710 Acquired absence of both cervix and uterus: Secondary | ICD-10-CM | POA: Diagnosis not present

## 2023-11-26 DIAGNOSIS — Z7984 Long term (current) use of oral hypoglycemic drugs: Secondary | ICD-10-CM | POA: Diagnosis not present

## 2023-11-26 DIAGNOSIS — I890 Lymphedema, not elsewhere classified: Secondary | ICD-10-CM | POA: Diagnosis not present

## 2023-11-26 DIAGNOSIS — K219 Gastro-esophageal reflux disease without esophagitis: Secondary | ICD-10-CM | POA: Diagnosis not present

## 2023-11-26 DIAGNOSIS — Z90710 Acquired absence of both cervix and uterus: Secondary | ICD-10-CM | POA: Diagnosis not present

## 2023-11-26 DIAGNOSIS — D631 Anemia in chronic kidney disease: Secondary | ICD-10-CM | POA: Diagnosis not present

## 2023-11-26 DIAGNOSIS — H6123 Impacted cerumen, bilateral: Secondary | ICD-10-CM | POA: Diagnosis not present

## 2023-11-26 DIAGNOSIS — N183 Chronic kidney disease, stage 3 unspecified: Secondary | ICD-10-CM | POA: Diagnosis not present

## 2023-11-26 DIAGNOSIS — E785 Hyperlipidemia, unspecified: Secondary | ICD-10-CM | POA: Diagnosis not present

## 2023-11-26 DIAGNOSIS — I69354 Hemiplegia and hemiparesis following cerebral infarction affecting left non-dominant side: Secondary | ICD-10-CM | POA: Diagnosis not present

## 2023-11-26 DIAGNOSIS — N179 Acute kidney failure, unspecified: Secondary | ICD-10-CM | POA: Diagnosis not present

## 2023-11-26 DIAGNOSIS — E1122 Type 2 diabetes mellitus with diabetic chronic kidney disease: Secondary | ICD-10-CM | POA: Diagnosis not present

## 2023-11-26 DIAGNOSIS — R197 Diarrhea, unspecified: Secondary | ICD-10-CM | POA: Diagnosis not present

## 2023-11-26 DIAGNOSIS — I129 Hypertensive chronic kidney disease with stage 1 through stage 4 chronic kidney disease, or unspecified chronic kidney disease: Secondary | ICD-10-CM | POA: Diagnosis not present

## 2023-11-26 DIAGNOSIS — Z9181 History of falling: Secondary | ICD-10-CM | POA: Diagnosis not present

## 2023-11-26 DIAGNOSIS — Z7902 Long term (current) use of antithrombotics/antiplatelets: Secondary | ICD-10-CM | POA: Diagnosis not present

## 2023-11-26 DIAGNOSIS — M109 Gout, unspecified: Secondary | ICD-10-CM | POA: Diagnosis not present

## 2023-11-28 DIAGNOSIS — E1122 Type 2 diabetes mellitus with diabetic chronic kidney disease: Secondary | ICD-10-CM | POA: Diagnosis not present

## 2023-11-28 DIAGNOSIS — N183 Chronic kidney disease, stage 3 unspecified: Secondary | ICD-10-CM | POA: Diagnosis not present

## 2023-11-28 DIAGNOSIS — I890 Lymphedema, not elsewhere classified: Secondary | ICD-10-CM | POA: Diagnosis not present

## 2023-11-28 DIAGNOSIS — H6123 Impacted cerumen, bilateral: Secondary | ICD-10-CM | POA: Diagnosis not present

## 2023-11-28 DIAGNOSIS — R197 Diarrhea, unspecified: Secondary | ICD-10-CM | POA: Diagnosis not present

## 2023-11-28 DIAGNOSIS — N179 Acute kidney failure, unspecified: Secondary | ICD-10-CM | POA: Diagnosis not present

## 2023-11-28 DIAGNOSIS — I129 Hypertensive chronic kidney disease with stage 1 through stage 4 chronic kidney disease, or unspecified chronic kidney disease: Secondary | ICD-10-CM | POA: Diagnosis not present

## 2023-11-28 DIAGNOSIS — Z7902 Long term (current) use of antithrombotics/antiplatelets: Secondary | ICD-10-CM | POA: Diagnosis not present

## 2023-11-28 DIAGNOSIS — E785 Hyperlipidemia, unspecified: Secondary | ICD-10-CM | POA: Diagnosis not present

## 2023-11-28 DIAGNOSIS — Z90710 Acquired absence of both cervix and uterus: Secondary | ICD-10-CM | POA: Diagnosis not present

## 2023-11-28 DIAGNOSIS — D631 Anemia in chronic kidney disease: Secondary | ICD-10-CM | POA: Diagnosis not present

## 2023-11-28 DIAGNOSIS — Z7984 Long term (current) use of oral hypoglycemic drugs: Secondary | ICD-10-CM | POA: Diagnosis not present

## 2023-11-28 DIAGNOSIS — I69354 Hemiplegia and hemiparesis following cerebral infarction affecting left non-dominant side: Secondary | ICD-10-CM | POA: Diagnosis not present

## 2023-11-28 DIAGNOSIS — K219 Gastro-esophageal reflux disease without esophagitis: Secondary | ICD-10-CM | POA: Diagnosis not present

## 2023-11-28 DIAGNOSIS — Z9181 History of falling: Secondary | ICD-10-CM | POA: Diagnosis not present

## 2023-11-28 DIAGNOSIS — M109 Gout, unspecified: Secondary | ICD-10-CM | POA: Diagnosis not present

## 2023-12-02 DIAGNOSIS — M6281 Muscle weakness (generalized): Secondary | ICD-10-CM | POA: Diagnosis not present

## 2023-12-02 DIAGNOSIS — I69354 Hemiplegia and hemiparesis following cerebral infarction affecting left non-dominant side: Secondary | ICD-10-CM | POA: Diagnosis not present

## 2023-12-02 DIAGNOSIS — I890 Lymphedema, not elsewhere classified: Secondary | ICD-10-CM | POA: Diagnosis not present

## 2023-12-02 DIAGNOSIS — Z90710 Acquired absence of both cervix and uterus: Secondary | ICD-10-CM | POA: Diagnosis not present

## 2023-12-02 DIAGNOSIS — N179 Acute kidney failure, unspecified: Secondary | ICD-10-CM | POA: Diagnosis not present

## 2023-12-02 DIAGNOSIS — Z7984 Long term (current) use of oral hypoglycemic drugs: Secondary | ICD-10-CM | POA: Diagnosis not present

## 2023-12-02 DIAGNOSIS — N183 Chronic kidney disease, stage 3 unspecified: Secondary | ICD-10-CM | POA: Diagnosis not present

## 2023-12-02 DIAGNOSIS — E785 Hyperlipidemia, unspecified: Secondary | ICD-10-CM | POA: Diagnosis not present

## 2023-12-02 DIAGNOSIS — Z7902 Long term (current) use of antithrombotics/antiplatelets: Secondary | ICD-10-CM | POA: Diagnosis not present

## 2023-12-02 DIAGNOSIS — Z9181 History of falling: Secondary | ICD-10-CM | POA: Diagnosis not present

## 2023-12-02 DIAGNOSIS — D631 Anemia in chronic kidney disease: Secondary | ICD-10-CM | POA: Diagnosis not present

## 2023-12-02 DIAGNOSIS — H6123 Impacted cerumen, bilateral: Secondary | ICD-10-CM | POA: Diagnosis not present

## 2023-12-02 DIAGNOSIS — E119 Type 2 diabetes mellitus without complications: Secondary | ICD-10-CM | POA: Diagnosis not present

## 2023-12-02 DIAGNOSIS — M109 Gout, unspecified: Secondary | ICD-10-CM | POA: Diagnosis not present

## 2023-12-02 DIAGNOSIS — I129 Hypertensive chronic kidney disease with stage 1 through stage 4 chronic kidney disease, or unspecified chronic kidney disease: Secondary | ICD-10-CM | POA: Diagnosis not present

## 2023-12-02 DIAGNOSIS — K219 Gastro-esophageal reflux disease without esophagitis: Secondary | ICD-10-CM | POA: Diagnosis not present

## 2023-12-02 DIAGNOSIS — R197 Diarrhea, unspecified: Secondary | ICD-10-CM | POA: Diagnosis not present

## 2023-12-02 DIAGNOSIS — E1122 Type 2 diabetes mellitus with diabetic chronic kidney disease: Secondary | ICD-10-CM | POA: Diagnosis not present

## 2023-12-02 DIAGNOSIS — N182 Chronic kidney disease, stage 2 (mild): Secondary | ICD-10-CM | POA: Diagnosis not present

## 2023-12-03 DIAGNOSIS — N179 Acute kidney failure, unspecified: Secondary | ICD-10-CM | POA: Diagnosis not present

## 2023-12-03 DIAGNOSIS — N183 Chronic kidney disease, stage 3 unspecified: Secondary | ICD-10-CM | POA: Diagnosis not present

## 2023-12-03 DIAGNOSIS — Z7902 Long term (current) use of antithrombotics/antiplatelets: Secondary | ICD-10-CM | POA: Diagnosis not present

## 2023-12-03 DIAGNOSIS — E1122 Type 2 diabetes mellitus with diabetic chronic kidney disease: Secondary | ICD-10-CM | POA: Diagnosis not present

## 2023-12-03 DIAGNOSIS — D631 Anemia in chronic kidney disease: Secondary | ICD-10-CM | POA: Diagnosis not present

## 2023-12-03 DIAGNOSIS — Z9181 History of falling: Secondary | ICD-10-CM | POA: Diagnosis not present

## 2023-12-03 DIAGNOSIS — Z90710 Acquired absence of both cervix and uterus: Secondary | ICD-10-CM | POA: Diagnosis not present

## 2023-12-03 DIAGNOSIS — I129 Hypertensive chronic kidney disease with stage 1 through stage 4 chronic kidney disease, or unspecified chronic kidney disease: Secondary | ICD-10-CM | POA: Diagnosis not present

## 2023-12-03 DIAGNOSIS — E785 Hyperlipidemia, unspecified: Secondary | ICD-10-CM | POA: Diagnosis not present

## 2023-12-03 DIAGNOSIS — I890 Lymphedema, not elsewhere classified: Secondary | ICD-10-CM | POA: Diagnosis not present

## 2023-12-03 DIAGNOSIS — Z7984 Long term (current) use of oral hypoglycemic drugs: Secondary | ICD-10-CM | POA: Diagnosis not present

## 2023-12-03 DIAGNOSIS — R197 Diarrhea, unspecified: Secondary | ICD-10-CM | POA: Diagnosis not present

## 2023-12-03 DIAGNOSIS — H6123 Impacted cerumen, bilateral: Secondary | ICD-10-CM | POA: Diagnosis not present

## 2023-12-03 DIAGNOSIS — M109 Gout, unspecified: Secondary | ICD-10-CM | POA: Diagnosis not present

## 2023-12-03 DIAGNOSIS — I69354 Hemiplegia and hemiparesis following cerebral infarction affecting left non-dominant side: Secondary | ICD-10-CM | POA: Diagnosis not present

## 2023-12-03 DIAGNOSIS — K219 Gastro-esophageal reflux disease without esophagitis: Secondary | ICD-10-CM | POA: Diagnosis not present

## 2023-12-05 DIAGNOSIS — K219 Gastro-esophageal reflux disease without esophagitis: Secondary | ICD-10-CM | POA: Diagnosis not present

## 2023-12-05 DIAGNOSIS — H6123 Impacted cerumen, bilateral: Secondary | ICD-10-CM | POA: Diagnosis not present

## 2023-12-05 DIAGNOSIS — I69359 Hemiplegia and hemiparesis following cerebral infarction affecting unspecified side: Secondary | ICD-10-CM | POA: Diagnosis not present

## 2023-12-05 DIAGNOSIS — Z7984 Long term (current) use of oral hypoglycemic drugs: Secondary | ICD-10-CM | POA: Diagnosis not present

## 2023-12-05 DIAGNOSIS — R197 Diarrhea, unspecified: Secondary | ICD-10-CM | POA: Diagnosis not present

## 2023-12-05 DIAGNOSIS — N183 Chronic kidney disease, stage 3 unspecified: Secondary | ICD-10-CM | POA: Diagnosis not present

## 2023-12-05 DIAGNOSIS — R531 Weakness: Secondary | ICD-10-CM | POA: Diagnosis not present

## 2023-12-05 DIAGNOSIS — I890 Lymphedema, not elsewhere classified: Secondary | ICD-10-CM | POA: Diagnosis not present

## 2023-12-05 DIAGNOSIS — I129 Hypertensive chronic kidney disease with stage 1 through stage 4 chronic kidney disease, or unspecified chronic kidney disease: Secondary | ICD-10-CM | POA: Diagnosis not present

## 2023-12-05 DIAGNOSIS — N179 Acute kidney failure, unspecified: Secondary | ICD-10-CM | POA: Diagnosis not present

## 2023-12-05 DIAGNOSIS — M109 Gout, unspecified: Secondary | ICD-10-CM | POA: Diagnosis not present

## 2023-12-05 DIAGNOSIS — Z9181 History of falling: Secondary | ICD-10-CM | POA: Diagnosis not present

## 2023-12-05 DIAGNOSIS — E1122 Type 2 diabetes mellitus with diabetic chronic kidney disease: Secondary | ICD-10-CM | POA: Diagnosis not present

## 2023-12-05 DIAGNOSIS — Z90710 Acquired absence of both cervix and uterus: Secondary | ICD-10-CM | POA: Diagnosis not present

## 2023-12-05 DIAGNOSIS — D631 Anemia in chronic kidney disease: Secondary | ICD-10-CM | POA: Diagnosis not present

## 2023-12-05 DIAGNOSIS — I69354 Hemiplegia and hemiparesis following cerebral infarction affecting left non-dominant side: Secondary | ICD-10-CM | POA: Diagnosis not present

## 2023-12-05 DIAGNOSIS — Z7902 Long term (current) use of antithrombotics/antiplatelets: Secondary | ICD-10-CM | POA: Diagnosis not present

## 2023-12-05 DIAGNOSIS — E785 Hyperlipidemia, unspecified: Secondary | ICD-10-CM | POA: Diagnosis not present

## 2023-12-05 DIAGNOSIS — N1831 Chronic kidney disease, stage 3a: Secondary | ICD-10-CM | POA: Diagnosis not present

## 2023-12-06 DIAGNOSIS — E1122 Type 2 diabetes mellitus with diabetic chronic kidney disease: Secondary | ICD-10-CM | POA: Diagnosis not present

## 2023-12-06 DIAGNOSIS — N179 Acute kidney failure, unspecified: Secondary | ICD-10-CM | POA: Diagnosis not present

## 2023-12-06 DIAGNOSIS — I69354 Hemiplegia and hemiparesis following cerebral infarction affecting left non-dominant side: Secondary | ICD-10-CM | POA: Diagnosis not present

## 2023-12-06 DIAGNOSIS — R197 Diarrhea, unspecified: Secondary | ICD-10-CM | POA: Diagnosis not present

## 2023-12-06 DIAGNOSIS — K219 Gastro-esophageal reflux disease without esophagitis: Secondary | ICD-10-CM | POA: Diagnosis not present

## 2023-12-06 DIAGNOSIS — I129 Hypertensive chronic kidney disease with stage 1 through stage 4 chronic kidney disease, or unspecified chronic kidney disease: Secondary | ICD-10-CM | POA: Diagnosis not present

## 2023-12-06 DIAGNOSIS — Z7984 Long term (current) use of oral hypoglycemic drugs: Secondary | ICD-10-CM | POA: Diagnosis not present

## 2023-12-06 DIAGNOSIS — H6123 Impacted cerumen, bilateral: Secondary | ICD-10-CM | POA: Diagnosis not present

## 2023-12-06 DIAGNOSIS — N183 Chronic kidney disease, stage 3 unspecified: Secondary | ICD-10-CM | POA: Diagnosis not present

## 2023-12-06 DIAGNOSIS — I890 Lymphedema, not elsewhere classified: Secondary | ICD-10-CM | POA: Diagnosis not present

## 2023-12-06 DIAGNOSIS — Z90710 Acquired absence of both cervix and uterus: Secondary | ICD-10-CM | POA: Diagnosis not present

## 2023-12-06 DIAGNOSIS — D631 Anemia in chronic kidney disease: Secondary | ICD-10-CM | POA: Diagnosis not present

## 2023-12-06 DIAGNOSIS — Z7902 Long term (current) use of antithrombotics/antiplatelets: Secondary | ICD-10-CM | POA: Diagnosis not present

## 2023-12-06 DIAGNOSIS — M109 Gout, unspecified: Secondary | ICD-10-CM | POA: Diagnosis not present

## 2023-12-06 DIAGNOSIS — E785 Hyperlipidemia, unspecified: Secondary | ICD-10-CM | POA: Diagnosis not present

## 2023-12-06 DIAGNOSIS — Z9181 History of falling: Secondary | ICD-10-CM | POA: Diagnosis not present

## 2023-12-09 DIAGNOSIS — Z7984 Long term (current) use of oral hypoglycemic drugs: Secondary | ICD-10-CM | POA: Diagnosis not present

## 2023-12-09 DIAGNOSIS — I890 Lymphedema, not elsewhere classified: Secondary | ICD-10-CM | POA: Diagnosis not present

## 2023-12-09 DIAGNOSIS — Z9181 History of falling: Secondary | ICD-10-CM | POA: Diagnosis not present

## 2023-12-09 DIAGNOSIS — N183 Chronic kidney disease, stage 3 unspecified: Secondary | ICD-10-CM | POA: Diagnosis not present

## 2023-12-09 DIAGNOSIS — I129 Hypertensive chronic kidney disease with stage 1 through stage 4 chronic kidney disease, or unspecified chronic kidney disease: Secondary | ICD-10-CM | POA: Diagnosis not present

## 2023-12-09 DIAGNOSIS — E1122 Type 2 diabetes mellitus with diabetic chronic kidney disease: Secondary | ICD-10-CM | POA: Diagnosis not present

## 2023-12-09 DIAGNOSIS — R197 Diarrhea, unspecified: Secondary | ICD-10-CM | POA: Diagnosis not present

## 2023-12-09 DIAGNOSIS — I69354 Hemiplegia and hemiparesis following cerebral infarction affecting left non-dominant side: Secondary | ICD-10-CM | POA: Diagnosis not present

## 2023-12-09 DIAGNOSIS — K219 Gastro-esophageal reflux disease without esophagitis: Secondary | ICD-10-CM | POA: Diagnosis not present

## 2023-12-09 DIAGNOSIS — H6123 Impacted cerumen, bilateral: Secondary | ICD-10-CM | POA: Diagnosis not present

## 2023-12-09 DIAGNOSIS — N179 Acute kidney failure, unspecified: Secondary | ICD-10-CM | POA: Diagnosis not present

## 2023-12-09 DIAGNOSIS — Z90710 Acquired absence of both cervix and uterus: Secondary | ICD-10-CM | POA: Diagnosis not present

## 2023-12-09 DIAGNOSIS — M109 Gout, unspecified: Secondary | ICD-10-CM | POA: Diagnosis not present

## 2023-12-09 DIAGNOSIS — E785 Hyperlipidemia, unspecified: Secondary | ICD-10-CM | POA: Diagnosis not present

## 2023-12-09 DIAGNOSIS — D631 Anemia in chronic kidney disease: Secondary | ICD-10-CM | POA: Diagnosis not present

## 2023-12-09 DIAGNOSIS — Z7902 Long term (current) use of antithrombotics/antiplatelets: Secondary | ICD-10-CM | POA: Diagnosis not present

## 2023-12-12 DIAGNOSIS — H6123 Impacted cerumen, bilateral: Secondary | ICD-10-CM | POA: Diagnosis not present

## 2023-12-12 DIAGNOSIS — I69354 Hemiplegia and hemiparesis following cerebral infarction affecting left non-dominant side: Secondary | ICD-10-CM | POA: Diagnosis not present

## 2023-12-12 DIAGNOSIS — Z9181 History of falling: Secondary | ICD-10-CM | POA: Diagnosis not present

## 2023-12-12 DIAGNOSIS — E785 Hyperlipidemia, unspecified: Secondary | ICD-10-CM | POA: Diagnosis not present

## 2023-12-12 DIAGNOSIS — Z7902 Long term (current) use of antithrombotics/antiplatelets: Secondary | ICD-10-CM | POA: Diagnosis not present

## 2023-12-12 DIAGNOSIS — Z90710 Acquired absence of both cervix and uterus: Secondary | ICD-10-CM | POA: Diagnosis not present

## 2023-12-12 DIAGNOSIS — R197 Diarrhea, unspecified: Secondary | ICD-10-CM | POA: Diagnosis not present

## 2023-12-12 DIAGNOSIS — D631 Anemia in chronic kidney disease: Secondary | ICD-10-CM | POA: Diagnosis not present

## 2023-12-12 DIAGNOSIS — N183 Chronic kidney disease, stage 3 unspecified: Secondary | ICD-10-CM | POA: Diagnosis not present

## 2023-12-12 DIAGNOSIS — E1122 Type 2 diabetes mellitus with diabetic chronic kidney disease: Secondary | ICD-10-CM | POA: Diagnosis not present

## 2023-12-12 DIAGNOSIS — K219 Gastro-esophageal reflux disease without esophagitis: Secondary | ICD-10-CM | POA: Diagnosis not present

## 2023-12-12 DIAGNOSIS — I890 Lymphedema, not elsewhere classified: Secondary | ICD-10-CM | POA: Diagnosis not present

## 2023-12-12 DIAGNOSIS — N179 Acute kidney failure, unspecified: Secondary | ICD-10-CM | POA: Diagnosis not present

## 2023-12-12 DIAGNOSIS — I129 Hypertensive chronic kidney disease with stage 1 through stage 4 chronic kidney disease, or unspecified chronic kidney disease: Secondary | ICD-10-CM | POA: Diagnosis not present

## 2023-12-12 DIAGNOSIS — Z7984 Long term (current) use of oral hypoglycemic drugs: Secondary | ICD-10-CM | POA: Diagnosis not present

## 2023-12-12 DIAGNOSIS — M109 Gout, unspecified: Secondary | ICD-10-CM | POA: Diagnosis not present

## 2023-12-13 DIAGNOSIS — Z7902 Long term (current) use of antithrombotics/antiplatelets: Secondary | ICD-10-CM | POA: Diagnosis not present

## 2023-12-13 DIAGNOSIS — E785 Hyperlipidemia, unspecified: Secondary | ICD-10-CM | POA: Diagnosis not present

## 2023-12-13 DIAGNOSIS — Z90710 Acquired absence of both cervix and uterus: Secondary | ICD-10-CM | POA: Diagnosis not present

## 2023-12-13 DIAGNOSIS — M109 Gout, unspecified: Secondary | ICD-10-CM | POA: Diagnosis not present

## 2023-12-13 DIAGNOSIS — N179 Acute kidney failure, unspecified: Secondary | ICD-10-CM | POA: Diagnosis not present

## 2023-12-13 DIAGNOSIS — I69354 Hemiplegia and hemiparesis following cerebral infarction affecting left non-dominant side: Secondary | ICD-10-CM | POA: Diagnosis not present

## 2023-12-13 DIAGNOSIS — H6123 Impacted cerumen, bilateral: Secondary | ICD-10-CM | POA: Diagnosis not present

## 2023-12-13 DIAGNOSIS — Z9181 History of falling: Secondary | ICD-10-CM | POA: Diagnosis not present

## 2023-12-13 DIAGNOSIS — R197 Diarrhea, unspecified: Secondary | ICD-10-CM | POA: Diagnosis not present

## 2023-12-13 DIAGNOSIS — D631 Anemia in chronic kidney disease: Secondary | ICD-10-CM | POA: Diagnosis not present

## 2023-12-13 DIAGNOSIS — Z7984 Long term (current) use of oral hypoglycemic drugs: Secondary | ICD-10-CM | POA: Diagnosis not present

## 2023-12-13 DIAGNOSIS — N183 Chronic kidney disease, stage 3 unspecified: Secondary | ICD-10-CM | POA: Diagnosis not present

## 2023-12-13 DIAGNOSIS — E1122 Type 2 diabetes mellitus with diabetic chronic kidney disease: Secondary | ICD-10-CM | POA: Diagnosis not present

## 2023-12-13 DIAGNOSIS — I129 Hypertensive chronic kidney disease with stage 1 through stage 4 chronic kidney disease, or unspecified chronic kidney disease: Secondary | ICD-10-CM | POA: Diagnosis not present

## 2023-12-13 DIAGNOSIS — I890 Lymphedema, not elsewhere classified: Secondary | ICD-10-CM | POA: Diagnosis not present

## 2023-12-13 DIAGNOSIS — K219 Gastro-esophageal reflux disease without esophagitis: Secondary | ICD-10-CM | POA: Diagnosis not present

## 2023-12-15 DIAGNOSIS — I890 Lymphedema, not elsewhere classified: Secondary | ICD-10-CM | POA: Diagnosis not present

## 2023-12-15 DIAGNOSIS — Z7902 Long term (current) use of antithrombotics/antiplatelets: Secondary | ICD-10-CM | POA: Diagnosis not present

## 2023-12-15 DIAGNOSIS — N179 Acute kidney failure, unspecified: Secondary | ICD-10-CM | POA: Diagnosis not present

## 2023-12-15 DIAGNOSIS — K219 Gastro-esophageal reflux disease without esophagitis: Secondary | ICD-10-CM | POA: Diagnosis not present

## 2023-12-15 DIAGNOSIS — M109 Gout, unspecified: Secondary | ICD-10-CM | POA: Diagnosis not present

## 2023-12-15 DIAGNOSIS — Z7984 Long term (current) use of oral hypoglycemic drugs: Secondary | ICD-10-CM | POA: Diagnosis not present

## 2023-12-15 DIAGNOSIS — R197 Diarrhea, unspecified: Secondary | ICD-10-CM | POA: Diagnosis not present

## 2023-12-15 DIAGNOSIS — Z90710 Acquired absence of both cervix and uterus: Secondary | ICD-10-CM | POA: Diagnosis not present

## 2023-12-15 DIAGNOSIS — N183 Chronic kidney disease, stage 3 unspecified: Secondary | ICD-10-CM | POA: Diagnosis not present

## 2023-12-15 DIAGNOSIS — H6123 Impacted cerumen, bilateral: Secondary | ICD-10-CM | POA: Diagnosis not present

## 2023-12-15 DIAGNOSIS — Z9181 History of falling: Secondary | ICD-10-CM | POA: Diagnosis not present

## 2023-12-15 DIAGNOSIS — D631 Anemia in chronic kidney disease: Secondary | ICD-10-CM | POA: Diagnosis not present

## 2023-12-15 DIAGNOSIS — I69354 Hemiplegia and hemiparesis following cerebral infarction affecting left non-dominant side: Secondary | ICD-10-CM | POA: Diagnosis not present

## 2023-12-15 DIAGNOSIS — I129 Hypertensive chronic kidney disease with stage 1 through stage 4 chronic kidney disease, or unspecified chronic kidney disease: Secondary | ICD-10-CM | POA: Diagnosis not present

## 2023-12-15 DIAGNOSIS — E785 Hyperlipidemia, unspecified: Secondary | ICD-10-CM | POA: Diagnosis not present

## 2023-12-15 DIAGNOSIS — E1122 Type 2 diabetes mellitus with diabetic chronic kidney disease: Secondary | ICD-10-CM | POA: Diagnosis not present

## 2023-12-17 ENCOUNTER — Other Ambulatory Visit: Payer: Self-pay

## 2023-12-17 ENCOUNTER — Emergency Department (HOSPITAL_COMMUNITY)
Admission: EM | Admit: 2023-12-17 | Discharge: 2023-12-18 | Disposition: A | Discharge status: Home / Self Care | Attending: Emergency Medicine | Admitting: Emergency Medicine

## 2023-12-17 ENCOUNTER — Encounter (HOSPITAL_COMMUNITY): Payer: Self-pay

## 2023-12-17 DIAGNOSIS — Z7902 Long term (current) use of antithrombotics/antiplatelets: Secondary | ICD-10-CM | POA: Diagnosis not present

## 2023-12-17 DIAGNOSIS — D631 Anemia in chronic kidney disease: Secondary | ICD-10-CM | POA: Diagnosis not present

## 2023-12-17 DIAGNOSIS — Z7984 Long term (current) use of oral hypoglycemic drugs: Secondary | ICD-10-CM | POA: Insufficient documentation

## 2023-12-17 DIAGNOSIS — H6123 Impacted cerumen, bilateral: Secondary | ICD-10-CM | POA: Diagnosis not present

## 2023-12-17 DIAGNOSIS — K219 Gastro-esophageal reflux disease without esophagitis: Secondary | ICD-10-CM | POA: Diagnosis not present

## 2023-12-17 DIAGNOSIS — Z79899 Other long term (current) drug therapy: Secondary | ICD-10-CM | POA: Diagnosis not present

## 2023-12-17 DIAGNOSIS — E785 Hyperlipidemia, unspecified: Secondary | ICD-10-CM | POA: Diagnosis not present

## 2023-12-17 DIAGNOSIS — E1122 Type 2 diabetes mellitus with diabetic chronic kidney disease: Secondary | ICD-10-CM | POA: Diagnosis not present

## 2023-12-17 DIAGNOSIS — I890 Lymphedema, not elsewhere classified: Secondary | ICD-10-CM | POA: Insufficient documentation

## 2023-12-17 DIAGNOSIS — M7989 Other specified soft tissue disorders: Secondary | ICD-10-CM | POA: Diagnosis not present

## 2023-12-17 DIAGNOSIS — N179 Acute kidney failure, unspecified: Secondary | ICD-10-CM | POA: Diagnosis not present

## 2023-12-17 DIAGNOSIS — Z7982 Long term (current) use of aspirin: Secondary | ICD-10-CM | POA: Insufficient documentation

## 2023-12-17 DIAGNOSIS — I129 Hypertensive chronic kidney disease with stage 1 through stage 4 chronic kidney disease, or unspecified chronic kidney disease: Secondary | ICD-10-CM | POA: Diagnosis not present

## 2023-12-17 DIAGNOSIS — N183 Chronic kidney disease, stage 3 unspecified: Secondary | ICD-10-CM | POA: Diagnosis not present

## 2023-12-17 DIAGNOSIS — R609 Edema, unspecified: Secondary | ICD-10-CM | POA: Diagnosis not present

## 2023-12-17 DIAGNOSIS — R2240 Localized swelling, mass and lump, unspecified lower limb: Secondary | ICD-10-CM | POA: Diagnosis not present

## 2023-12-17 DIAGNOSIS — Z9181 History of falling: Secondary | ICD-10-CM | POA: Diagnosis not present

## 2023-12-17 DIAGNOSIS — I69354 Hemiplegia and hemiparesis following cerebral infarction affecting left non-dominant side: Secondary | ICD-10-CM | POA: Diagnosis not present

## 2023-12-17 DIAGNOSIS — R197 Diarrhea, unspecified: Secondary | ICD-10-CM | POA: Diagnosis not present

## 2023-12-17 DIAGNOSIS — Z90710 Acquired absence of both cervix and uterus: Secondary | ICD-10-CM | POA: Diagnosis not present

## 2023-12-17 DIAGNOSIS — M109 Gout, unspecified: Secondary | ICD-10-CM | POA: Diagnosis not present

## 2023-12-17 LAB — COMPREHENSIVE METABOLIC PANEL WITH GFR
ALT: 42 U/L (ref 0–44)
AST: 48 U/L — ABNORMAL HIGH (ref 15–41)
Albumin: 3.9 g/dL (ref 3.5–5.0)
Alkaline Phosphatase: 129 U/L — ABNORMAL HIGH (ref 38–126)
Anion gap: 12 (ref 5–15)
BUN: 30 mg/dL — ABNORMAL HIGH (ref 8–23)
CO2: 24 mmol/L (ref 22–32)
Calcium: 9.2 mg/dL (ref 8.9–10.3)
Chloride: 102 mmol/L (ref 98–111)
Creatinine, Ser: 1.8 mg/dL — ABNORMAL HIGH (ref 0.44–1.00)
GFR, Estimated: 28 mL/min — ABNORMAL LOW (ref >60)
Glucose, Bld: 80 mg/dL (ref 70–99)
Potassium: 3.9 mmol/L (ref 3.5–5.1)
Sodium: 138 mmol/L (ref 135–145)
Total Bilirubin: 1 mg/dL (ref 0.0–1.2)
Total Protein: 7.5 g/dL (ref 6.5–8.1)

## 2023-12-17 LAB — CBC WITH DIFFERENTIAL/PLATELET
Abs Immature Granulocytes: 0.02 10*3/uL (ref 0.00–0.07)
Basophils Absolute: 0 10*3/uL (ref 0.0–0.1)
Basophils Relative: 1 %
Eosinophils Absolute: 0.1 10*3/uL (ref 0.0–0.5)
Eosinophils Relative: 2 %
HCT: 35.5 % — ABNORMAL LOW (ref 36.0–46.0)
Hemoglobin: 11.6 g/dL — ABNORMAL LOW (ref 12.0–15.0)
Immature Granulocytes: 0 %
Lymphocytes Relative: 26 %
Lymphs Abs: 1.6 10*3/uL (ref 0.7–4.0)
MCH: 26.1 pg (ref 26.0–34.0)
MCHC: 32.7 g/dL (ref 30.0–36.0)
MCV: 80 fL (ref 80.0–100.0)
Monocytes Absolute: 0.5 10*3/uL (ref 0.1–1.0)
Monocytes Relative: 8 %
Neutro Abs: 3.7 10*3/uL (ref 1.7–7.7)
Neutrophils Relative %: 63 %
Platelets: 158 10*3/uL (ref 150–400)
RBC: 4.44 MIL/uL (ref 3.87–5.11)
RDW: 20.6 % — ABNORMAL HIGH (ref 11.5–15.5)
WBC: 5.9 10*3/uL (ref 4.0–10.5)
nRBC: 0.3 % — ABNORMAL HIGH (ref 0.0–0.2)

## 2023-12-17 LAB — BRAIN NATRIURETIC PEPTIDE: B Natriuretic Peptide: 123.7 pg/mL — ABNORMAL HIGH (ref 0.0–100.0)

## 2023-12-17 MED ORDER — CEPHALEXIN 500 MG PO CAPS: 500.0000 mg | ORAL_CAPSULE | Freq: Two times a day (BID) | ORAL | 0 refills | Status: AC

## 2023-12-17 NOTE — ED Notes (Signed)
 This RN called [facility]

## 2023-12-17 NOTE — ED Triage Notes (Signed)
 Pt BIB EMS from Home due to Bilateral Upper Leg Edema; pt reports Left upper leg being worse than right, left upper leg weeping fluid and blood. Hx of Stroke 2012; pt has Left sided weakness at baseline. Hx of lymphoedema; pt report lymphoedema is a chronic problem but getting worse lately since yesterday. Pt walk at home with walker.  BP 142/70 HR 70 SpO2 97 CBG 97

## 2023-12-17 NOTE — Discharge Instructions (Signed)
 Your labs and evaluation today have been generally reassuring.  There is some consideration of early infection in your left leg may be contributing to your fluid discharge.  Antibiotics have been prescribed, please take these as directed.  Home health services that have also been ordered, and you should receive a call in the coming days for this assessment.  Return here for concerning changes in your condition.

## 2023-12-17 NOTE — ED Provider Notes (Signed)
 [place] EMERGENCY DEPARTMENT AT [facility] Provider Note   CSN: [id number] Arrival date & time: 12/17/23  1619     History  Chief Complaint  Patient presents with   Leg Swelling    Bilateral Upper Leg Edema    Laurie Hunter is a 78 y.o. female.  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  The colleague helps with the history.  Patient has a history of chronic lymphedema, notes that over the past days she has noticed weeping from the left posterior thigh.  No systemic complaints, including fever, pain, new weakness. She has been taking her medication as prescribed, has been generally well until this began.    Home Medications Prior to Admission medications   Medication Sig Start Date End Date Taking? Authorizing Provider  cephALEXin (KEFLEX) 500 MG capsule Take 1 capsule (500 mg total) by mouth 2 (two) times daily for 5 days. 12/17/23 12/22/23 Yes Dorenda Gandy, MD  acetaminophen  (TYLENOL ) 500 MG tablet Take 500-1,000 mg by mouth every 6 (six) hours as needed for mild pain (pain score 1-3) or moderate pain (pain score 4-6).    [provider]  allopurinol  (ZYLOPRIM ) 100 MG tablet Take 100 mg by mouth in the morning.    [provider]  amLODipine  (NORVASC ) 10 MG tablet Take 10 mg by mouth at bedtime.    [provider]  aspirin  81 MG tablet Take 81 mg by mouth in the morning.    [provider]  atenolol (TENORMIN) 50 MG tablet Take 50 mg by mouth in the morning.    [provider]  Bismuth  Subsalicylate (KAOPECTATE PO) Take 1-2 tablets by mouth as needed.    [provider]  bismuth  subsalicylate (PEPTO BISMOL) 262 MG chewable tablet Chew 524 mg by mouth as needed for indigestion or diarrhea or loose stools.    [provider]  carbamide peroxide (DEBROX) 6.5 % OTIC solution Place 5 drops into the right ear 2 (two) times daily. 11/01/23   Oral Billings, MD   clopidogrel  (PLAVIX ) 75 MG tablet Take 75 mg by mouth in the morning.    [provider]  colchicine 0.6 MG tablet Take 0.6 mg by mouth 2 (two) times daily.    [provider]  diphenhydrAMINE  (BENADRYL ) 25 MG tablet Take 50 mg by mouth at bedtime. 10/17/12   [provider]  famotidine  (PEPCID ) 20 MG tablet Take 1 tablet (20 mg total) by mouth 2 (two) times daily. 10/17/12   Early Glisson, MD  FEROSUL 325 (65 Fe) MG tablet Take 325 mg by mouth in the morning. 03/08/20   [provider]  Loperamide HCl (IMODIUM PO) Take 1 tablet by mouth at bedtime as needed.    [provider]  melatonin 5 MG TABS Take 5 mg by mouth at bedtime as needed.    [provider]  metFORMIN (GLUCOPHAGE) 500 MG tablet Take 500 mg by mouth in the morning.    [provider]      Allergies    Erythromycin, Lisinopril, and Penicillins    Review of Systems   Review of Systems  Physical Exam Updated Vital Signs BP (!) 160/70 (BP Location: Left Arm)   Pulse (!) 56   Temp 98.1 F (36.7 C) (Oral)   Resp 18   SpO2 100%  Physical Exam Vitals and nursing note reviewed.  Constitutional:      General: She is  not in acute distress.    Appearance: She is well-developed.  HENT:     Head: Normocephalic and atraumatic.  Eyes:     Conjunctiva/sclera: Conjunctivae normal.  Cardiovascular:     Rate and Rhythm: Normal rate and regular rhythm.  Pulmonary:     Effort: Pulmonary effort is normal. No respiratory distress.     Breath sounds: No stridor.  Abdominal:     General: There is no distension.  Musculoskeletal:     Comments: Substantial lymphedema of both thighs.  On the posterior of the left thigh there is an indurated area approximately 10 cm with an area trace clear fluid being expressed.  No surrounding substantial erythema.  Skin:    General: Skin is warm and dry.  Neurological:     Mental Status: She is alert and oriented to person, place, and time.      Cranial Nerves: No cranial nerve deficit.  Psychiatric:        Mood and Affect: Mood normal.     ED Results / Procedures / Treatments   Labs (all labs ordered are listed, but only abnormal results are displayed) Labs Reviewed  COMPREHENSIVE METABOLIC PANEL WITH GFR - Abnormal; Notable for the following components:      Result Value   BUN 30 (*)    Creatinine, Ser 1.80 (*)    AST 48 (*)    Alkaline Phosphatase 129 (*)    GFR, Estimated 28 (*)    All other components within normal limits  CBC WITH DIFFERENTIAL/PLATELET - Abnormal; Notable for the following components:   Hemoglobin 11.6 (*)    HCT 35.5 (*)    RDW 20.6 (*)    nRBC 0.3 (*)    All other components within normal limits  BRAIN NATRIURETIC PEPTIDE - Abnormal; Notable for the following components:   B Natriuretic Peptide 123.7 (*)    All other components within normal limits    EKG None  Radiology No results found.  Procedures Procedures    Medications Ordered in ED Medications - No data to display  ED Course/ Medical Decision Making/ A&P                                 Medical Decision Making Adult female with history of lymphedema presents with new discharge from her left posterior leg.  Broad differential including lymphedematous exudate, infection, thrombosis considered.  No clinical evidence for thrombosis, no evidence of bacteremia, sepsis, additional concerns including hepatic, renal, cardiac dysfunction, labs sent. Pulse ox 98% room air normal  Amount and/or Complexity of Data Reviewed Independent Historian: friend External Data Reviewed: notes. Labs: ordered. Decision-making details documented in ED Course.  Risk Prescription drug management. Decision regarding hospitalization. Diagnosis or treatment significantly limited by social determinants of health.   9:45 PM Patient has been monitored for hours has had no decompensation, labs reviewed, consistent with prior.  She has waxing,  waning renal dysfunction, today is slightly diminished most recent, but not as bad as it has been recently. Other labs reassuring, suspicion for mild infection contributing to her discharge, versus fluid overload, no evidence for thrombosis, no chest pain, no dyspnea.  Patient had home health services ordered, will start antibiotics, will follow-up with her primary care physician.        Final Clinical Impression(s) / ED Diagnoses Final diagnoses:  Leg swelling    Rx / DC Orders ED Discharge Orders  Ordered    Home Health        12/17/23 2142    Face-to-face encounter (required for Medicare/Medicaid patients)       Comments: I Dorenda Gandy certify that this patient is under my care and that I, or a nurse practitioner or physician's assistant working with me, had a face-to-face encounter that meets the physician face-to-face encounter requirements with this patient on 12/17/2023. The encounter with the patient was in whole, or in part for the following medical condition(s) which is the primary reason for home health care (List medical condition): additional orders per PMD   12/17/23 2142    cephALEXin (KEFLEX) 500 MG capsule  2 times daily        12/17/23 2142              Dorenda Gandy, MD 12/17/23 2145

## 2023-12-18 NOTE — ED Notes (Signed)
 [facility] called again for transport, pt was skipped on list and dispatch reported they were not on the list.

## 2023-12-19 DIAGNOSIS — I129 Hypertensive chronic kidney disease with stage 1 through stage 4 chronic kidney disease, or unspecified chronic kidney disease: Secondary | ICD-10-CM | POA: Diagnosis not present

## 2023-12-19 DIAGNOSIS — H6123 Impacted cerumen, bilateral: Secondary | ICD-10-CM | POA: Diagnosis not present

## 2023-12-19 DIAGNOSIS — M109 Gout, unspecified: Secondary | ICD-10-CM | POA: Diagnosis not present

## 2023-12-19 DIAGNOSIS — I69354 Hemiplegia and hemiparesis following cerebral infarction affecting left non-dominant side: Secondary | ICD-10-CM | POA: Diagnosis not present

## 2023-12-19 DIAGNOSIS — E1122 Type 2 diabetes mellitus with diabetic chronic kidney disease: Secondary | ICD-10-CM | POA: Diagnosis not present

## 2023-12-19 DIAGNOSIS — N179 Acute kidney failure, unspecified: Secondary | ICD-10-CM | POA: Diagnosis not present

## 2023-12-19 DIAGNOSIS — I890 Lymphedema, not elsewhere classified: Secondary | ICD-10-CM | POA: Diagnosis not present

## 2023-12-19 DIAGNOSIS — E785 Hyperlipidemia, unspecified: Secondary | ICD-10-CM | POA: Diagnosis not present

## 2023-12-19 DIAGNOSIS — Z90710 Acquired absence of both cervix and uterus: Secondary | ICD-10-CM | POA: Diagnosis not present

## 2023-12-19 DIAGNOSIS — Z7984 Long term (current) use of oral hypoglycemic drugs: Secondary | ICD-10-CM | POA: Diagnosis not present

## 2023-12-19 DIAGNOSIS — D631 Anemia in chronic kidney disease: Secondary | ICD-10-CM | POA: Diagnosis not present

## 2023-12-19 DIAGNOSIS — R197 Diarrhea, unspecified: Secondary | ICD-10-CM | POA: Diagnosis not present

## 2023-12-19 DIAGNOSIS — N183 Chronic kidney disease, stage 3 unspecified: Secondary | ICD-10-CM | POA: Diagnosis not present

## 2023-12-19 DIAGNOSIS — Z9181 History of falling: Secondary | ICD-10-CM | POA: Diagnosis not present

## 2023-12-19 DIAGNOSIS — Z7902 Long term (current) use of antithrombotics/antiplatelets: Secondary | ICD-10-CM | POA: Diagnosis not present

## 2023-12-19 DIAGNOSIS — K219 Gastro-esophageal reflux disease without esophagitis: Secondary | ICD-10-CM | POA: Diagnosis not present

## 2023-12-23 DIAGNOSIS — E1122 Type 2 diabetes mellitus with diabetic chronic kidney disease: Secondary | ICD-10-CM | POA: Diagnosis not present

## 2023-12-23 DIAGNOSIS — I890 Lymphedema, not elsewhere classified: Secondary | ICD-10-CM | POA: Diagnosis not present

## 2023-12-23 DIAGNOSIS — Z7984 Long term (current) use of oral hypoglycemic drugs: Secondary | ICD-10-CM | POA: Diagnosis not present

## 2023-12-23 DIAGNOSIS — N179 Acute kidney failure, unspecified: Secondary | ICD-10-CM | POA: Diagnosis not present

## 2023-12-23 DIAGNOSIS — M109 Gout, unspecified: Secondary | ICD-10-CM | POA: Diagnosis not present

## 2023-12-23 DIAGNOSIS — Z90710 Acquired absence of both cervix and uterus: Secondary | ICD-10-CM | POA: Diagnosis not present

## 2023-12-23 DIAGNOSIS — D631 Anemia in chronic kidney disease: Secondary | ICD-10-CM | POA: Diagnosis not present

## 2023-12-23 DIAGNOSIS — Z9181 History of falling: Secondary | ICD-10-CM | POA: Diagnosis not present

## 2023-12-23 DIAGNOSIS — I69354 Hemiplegia and hemiparesis following cerebral infarction affecting left non-dominant side: Secondary | ICD-10-CM | POA: Diagnosis not present

## 2023-12-23 DIAGNOSIS — Z7902 Long term (current) use of antithrombotics/antiplatelets: Secondary | ICD-10-CM | POA: Diagnosis not present

## 2023-12-23 DIAGNOSIS — E785 Hyperlipidemia, unspecified: Secondary | ICD-10-CM | POA: Diagnosis not present

## 2023-12-23 DIAGNOSIS — I129 Hypertensive chronic kidney disease with stage 1 through stage 4 chronic kidney disease, or unspecified chronic kidney disease: Secondary | ICD-10-CM | POA: Diagnosis not present

## 2023-12-23 DIAGNOSIS — H6123 Impacted cerumen, bilateral: Secondary | ICD-10-CM | POA: Diagnosis not present

## 2023-12-23 DIAGNOSIS — R197 Diarrhea, unspecified: Secondary | ICD-10-CM | POA: Diagnosis not present

## 2023-12-23 DIAGNOSIS — K219 Gastro-esophageal reflux disease without esophagitis: Secondary | ICD-10-CM | POA: Diagnosis not present

## 2023-12-23 DIAGNOSIS — N183 Chronic kidney disease, stage 3 unspecified: Secondary | ICD-10-CM | POA: Diagnosis not present

## 2023-12-24 DIAGNOSIS — I129 Hypertensive chronic kidney disease with stage 1 through stage 4 chronic kidney disease, or unspecified chronic kidney disease: Secondary | ICD-10-CM | POA: Diagnosis not present

## 2023-12-24 DIAGNOSIS — Z7984 Long term (current) use of oral hypoglycemic drugs: Secondary | ICD-10-CM | POA: Diagnosis not present

## 2023-12-24 DIAGNOSIS — H6123 Impacted cerumen, bilateral: Secondary | ICD-10-CM | POA: Diagnosis not present

## 2023-12-24 DIAGNOSIS — Z7902 Long term (current) use of antithrombotics/antiplatelets: Secondary | ICD-10-CM | POA: Diagnosis not present

## 2023-12-24 DIAGNOSIS — Z90710 Acquired absence of both cervix and uterus: Secondary | ICD-10-CM | POA: Diagnosis not present

## 2023-12-24 DIAGNOSIS — N183 Chronic kidney disease, stage 3 unspecified: Secondary | ICD-10-CM | POA: Diagnosis not present

## 2023-12-24 DIAGNOSIS — D631 Anemia in chronic kidney disease: Secondary | ICD-10-CM | POA: Diagnosis not present

## 2023-12-24 DIAGNOSIS — E785 Hyperlipidemia, unspecified: Secondary | ICD-10-CM | POA: Diagnosis not present

## 2023-12-24 DIAGNOSIS — M109 Gout, unspecified: Secondary | ICD-10-CM | POA: Diagnosis not present

## 2023-12-24 DIAGNOSIS — I69354 Hemiplegia and hemiparesis following cerebral infarction affecting left non-dominant side: Secondary | ICD-10-CM | POA: Diagnosis not present

## 2023-12-24 DIAGNOSIS — Z9181 History of falling: Secondary | ICD-10-CM | POA: Diagnosis not present

## 2023-12-24 DIAGNOSIS — E1122 Type 2 diabetes mellitus with diabetic chronic kidney disease: Secondary | ICD-10-CM | POA: Diagnosis not present

## 2023-12-24 DIAGNOSIS — N179 Acute kidney failure, unspecified: Secondary | ICD-10-CM | POA: Diagnosis not present

## 2023-12-24 DIAGNOSIS — R197 Diarrhea, unspecified: Secondary | ICD-10-CM | POA: Diagnosis not present

## 2023-12-24 DIAGNOSIS — I890 Lymphedema, not elsewhere classified: Secondary | ICD-10-CM | POA: Diagnosis not present

## 2023-12-24 DIAGNOSIS — K219 Gastro-esophageal reflux disease without esophagitis: Secondary | ICD-10-CM | POA: Diagnosis not present

## 2023-12-30 DIAGNOSIS — M6281 Muscle weakness (generalized): Secondary | ICD-10-CM | POA: Diagnosis not present

## 2023-12-30 DIAGNOSIS — N182 Chronic kidney disease, stage 2 (mild): Secondary | ICD-10-CM | POA: Diagnosis not present

## 2023-12-30 DIAGNOSIS — E119 Type 2 diabetes mellitus without complications: Secondary | ICD-10-CM | POA: Diagnosis not present

## 2023-12-30 DIAGNOSIS — I69354 Hemiplegia and hemiparesis following cerebral infarction affecting left non-dominant side: Secondary | ICD-10-CM | POA: Diagnosis not present

## 2023-12-31 DIAGNOSIS — N183 Chronic kidney disease, stage 3 unspecified: Secondary | ICD-10-CM | POA: Diagnosis not present

## 2023-12-31 DIAGNOSIS — Z9181 History of falling: Secondary | ICD-10-CM | POA: Diagnosis not present

## 2023-12-31 DIAGNOSIS — Z7984 Long term (current) use of oral hypoglycemic drugs: Secondary | ICD-10-CM | POA: Diagnosis not present

## 2023-12-31 DIAGNOSIS — E1122 Type 2 diabetes mellitus with diabetic chronic kidney disease: Secondary | ICD-10-CM | POA: Diagnosis not present

## 2023-12-31 DIAGNOSIS — Z90710 Acquired absence of both cervix and uterus: Secondary | ICD-10-CM | POA: Diagnosis not present

## 2023-12-31 DIAGNOSIS — H6123 Impacted cerumen, bilateral: Secondary | ICD-10-CM | POA: Diagnosis not present

## 2023-12-31 DIAGNOSIS — I69354 Hemiplegia and hemiparesis following cerebral infarction affecting left non-dominant side: Secondary | ICD-10-CM | POA: Diagnosis not present

## 2023-12-31 DIAGNOSIS — N179 Acute kidney failure, unspecified: Secondary | ICD-10-CM | POA: Diagnosis not present

## 2023-12-31 DIAGNOSIS — I129 Hypertensive chronic kidney disease with stage 1 through stage 4 chronic kidney disease, or unspecified chronic kidney disease: Secondary | ICD-10-CM | POA: Diagnosis not present

## 2023-12-31 DIAGNOSIS — K219 Gastro-esophageal reflux disease without esophagitis: Secondary | ICD-10-CM | POA: Diagnosis not present

## 2023-12-31 DIAGNOSIS — Z7902 Long term (current) use of antithrombotics/antiplatelets: Secondary | ICD-10-CM | POA: Diagnosis not present

## 2023-12-31 DIAGNOSIS — M109 Gout, unspecified: Secondary | ICD-10-CM | POA: Diagnosis not present

## 2023-12-31 DIAGNOSIS — I890 Lymphedema, not elsewhere classified: Secondary | ICD-10-CM | POA: Diagnosis not present

## 2023-12-31 DIAGNOSIS — R197 Diarrhea, unspecified: Secondary | ICD-10-CM | POA: Diagnosis not present

## 2023-12-31 DIAGNOSIS — E785 Hyperlipidemia, unspecified: Secondary | ICD-10-CM | POA: Diagnosis not present

## 2023-12-31 DIAGNOSIS — D631 Anemia in chronic kidney disease: Secondary | ICD-10-CM | POA: Diagnosis not present

## 2024-01-02 DIAGNOSIS — E119 Type 2 diabetes mellitus without complications: Secondary | ICD-10-CM | POA: Diagnosis not present

## 2024-01-02 DIAGNOSIS — I69354 Hemiplegia and hemiparesis following cerebral infarction affecting left non-dominant side: Secondary | ICD-10-CM | POA: Diagnosis not present

## 2024-01-02 DIAGNOSIS — M6281 Muscle weakness (generalized): Secondary | ICD-10-CM | POA: Diagnosis not present

## 2024-01-02 DIAGNOSIS — N182 Chronic kidney disease, stage 2 (mild): Secondary | ICD-10-CM | POA: Diagnosis not present

## 2024-01-03 DIAGNOSIS — Z9181 History of falling: Secondary | ICD-10-CM | POA: Diagnosis not present

## 2024-01-03 DIAGNOSIS — D631 Anemia in chronic kidney disease: Secondary | ICD-10-CM | POA: Diagnosis not present

## 2024-01-03 DIAGNOSIS — Z7984 Long term (current) use of oral hypoglycemic drugs: Secondary | ICD-10-CM | POA: Diagnosis not present

## 2024-01-03 DIAGNOSIS — K219 Gastro-esophageal reflux disease without esophagitis: Secondary | ICD-10-CM | POA: Diagnosis not present

## 2024-01-03 DIAGNOSIS — E785 Hyperlipidemia, unspecified: Secondary | ICD-10-CM | POA: Diagnosis not present

## 2024-01-03 DIAGNOSIS — E1122 Type 2 diabetes mellitus with diabetic chronic kidney disease: Secondary | ICD-10-CM | POA: Diagnosis not present

## 2024-01-03 DIAGNOSIS — Z7902 Long term (current) use of antithrombotics/antiplatelets: Secondary | ICD-10-CM | POA: Diagnosis not present

## 2024-01-03 DIAGNOSIS — Z90710 Acquired absence of both cervix and uterus: Secondary | ICD-10-CM | POA: Diagnosis not present

## 2024-01-03 DIAGNOSIS — I69354 Hemiplegia and hemiparesis following cerebral infarction affecting left non-dominant side: Secondary | ICD-10-CM | POA: Diagnosis not present

## 2024-01-03 DIAGNOSIS — H6123 Impacted cerumen, bilateral: Secondary | ICD-10-CM | POA: Diagnosis not present

## 2024-01-03 DIAGNOSIS — R197 Diarrhea, unspecified: Secondary | ICD-10-CM | POA: Diagnosis not present

## 2024-01-03 DIAGNOSIS — N179 Acute kidney failure, unspecified: Secondary | ICD-10-CM | POA: Diagnosis not present

## 2024-01-03 DIAGNOSIS — I890 Lymphedema, not elsewhere classified: Secondary | ICD-10-CM | POA: Diagnosis not present

## 2024-01-03 DIAGNOSIS — M109 Gout, unspecified: Secondary | ICD-10-CM | POA: Diagnosis not present

## 2024-01-03 DIAGNOSIS — I129 Hypertensive chronic kidney disease with stage 1 through stage 4 chronic kidney disease, or unspecified chronic kidney disease: Secondary | ICD-10-CM | POA: Diagnosis not present

## 2024-01-03 DIAGNOSIS — N183 Chronic kidney disease, stage 3 unspecified: Secondary | ICD-10-CM | POA: Diagnosis not present

## 2024-01-06 DIAGNOSIS — K219 Gastro-esophageal reflux disease without esophagitis: Secondary | ICD-10-CM | POA: Diagnosis not present

## 2024-01-06 DIAGNOSIS — Z7902 Long term (current) use of antithrombotics/antiplatelets: Secondary | ICD-10-CM | POA: Diagnosis not present

## 2024-01-06 DIAGNOSIS — E785 Hyperlipidemia, unspecified: Secondary | ICD-10-CM | POA: Diagnosis not present

## 2024-01-06 DIAGNOSIS — R197 Diarrhea, unspecified: Secondary | ICD-10-CM | POA: Diagnosis not present

## 2024-01-06 DIAGNOSIS — I890 Lymphedema, not elsewhere classified: Secondary | ICD-10-CM | POA: Diagnosis not present

## 2024-01-06 DIAGNOSIS — N179 Acute kidney failure, unspecified: Secondary | ICD-10-CM | POA: Diagnosis not present

## 2024-01-06 DIAGNOSIS — H6123 Impacted cerumen, bilateral: Secondary | ICD-10-CM | POA: Diagnosis not present

## 2024-01-06 DIAGNOSIS — M109 Gout, unspecified: Secondary | ICD-10-CM | POA: Diagnosis not present

## 2024-01-06 DIAGNOSIS — I69354 Hemiplegia and hemiparesis following cerebral infarction affecting left non-dominant side: Secondary | ICD-10-CM | POA: Diagnosis not present

## 2024-01-06 DIAGNOSIS — I129 Hypertensive chronic kidney disease with stage 1 through stage 4 chronic kidney disease, or unspecified chronic kidney disease: Secondary | ICD-10-CM | POA: Diagnosis not present

## 2024-01-06 DIAGNOSIS — Z7984 Long term (current) use of oral hypoglycemic drugs: Secondary | ICD-10-CM | POA: Diagnosis not present

## 2024-01-06 DIAGNOSIS — N183 Chronic kidney disease, stage 3 unspecified: Secondary | ICD-10-CM | POA: Diagnosis not present

## 2024-01-06 DIAGNOSIS — Z9181 History of falling: Secondary | ICD-10-CM | POA: Diagnosis not present

## 2024-01-06 DIAGNOSIS — D631 Anemia in chronic kidney disease: Secondary | ICD-10-CM | POA: Diagnosis not present

## 2024-01-06 DIAGNOSIS — Z90710 Acquired absence of both cervix and uterus: Secondary | ICD-10-CM | POA: Diagnosis not present

## 2024-01-06 DIAGNOSIS — E1122 Type 2 diabetes mellitus with diabetic chronic kidney disease: Secondary | ICD-10-CM | POA: Diagnosis not present

## 2024-01-07 DIAGNOSIS — I10 Essential (primary) hypertension: Secondary | ICD-10-CM | POA: Diagnosis not present

## 2024-01-07 DIAGNOSIS — Z0000 Encounter for general adult medical examination without abnormal findings: Secondary | ICD-10-CM | POA: Diagnosis not present

## 2024-01-07 DIAGNOSIS — N1831 Chronic kidney disease, stage 3a: Secondary | ICD-10-CM | POA: Diagnosis not present

## 2024-01-07 DIAGNOSIS — E1122 Type 2 diabetes mellitus with diabetic chronic kidney disease: Secondary | ICD-10-CM | POA: Diagnosis not present

## 2024-01-07 DIAGNOSIS — R197 Diarrhea, unspecified: Secondary | ICD-10-CM | POA: Diagnosis not present

## 2024-01-08 ENCOUNTER — Emergency Department (HOSPITAL_COMMUNITY)
Admission: EM | Admit: 2024-01-08 | Discharge: 2024-01-08 | Disposition: A | Discharge status: Home / Self Care | Attending: Emergency Medicine | Admitting: Emergency Medicine

## 2024-01-08 ENCOUNTER — Emergency Department (HOSPITAL_COMMUNITY)

## 2024-01-08 DIAGNOSIS — W19XXXA Unspecified fall, initial encounter: Secondary | ICD-10-CM | POA: Diagnosis not present

## 2024-01-08 DIAGNOSIS — M25561 Pain in right knee: Secondary | ICD-10-CM | POA: Insufficient documentation

## 2024-01-08 DIAGNOSIS — M25551 Pain in right hip: Secondary | ICD-10-CM | POA: Insufficient documentation

## 2024-01-08 DIAGNOSIS — S80919A Unspecified superficial injury of unspecified knee, initial encounter: Secondary | ICD-10-CM | POA: Diagnosis not present

## 2024-01-08 DIAGNOSIS — Z7982 Long term (current) use of aspirin: Secondary | ICD-10-CM | POA: Diagnosis not present

## 2024-01-08 DIAGNOSIS — M25562 Pain in left knee: Secondary | ICD-10-CM | POA: Diagnosis not present

## 2024-01-08 DIAGNOSIS — M1712 Unilateral primary osteoarthritis, left knee: Secondary | ICD-10-CM | POA: Diagnosis not present

## 2024-01-08 DIAGNOSIS — Z043 Encounter for examination and observation following other accident: Secondary | ICD-10-CM | POA: Diagnosis not present

## 2024-01-08 DIAGNOSIS — R531 Weakness: Secondary | ICD-10-CM | POA: Diagnosis not present

## 2024-01-08 DIAGNOSIS — M1711 Unilateral primary osteoarthritis, right knee: Secondary | ICD-10-CM | POA: Diagnosis not present

## 2024-01-08 NOTE — Discharge Instructions (Signed)
 Follow-up with your primary care provider this week.  Return to the emergency department if your symptoms worsen.  Continue to work with physical therapy weekly in order to help improve your mobility.

## 2024-01-08 NOTE — ED Triage Notes (Signed)
 Pt arrived with EMS. Almost tripped on tripped on rug was able to catch herself from falling, leaned on wall, lowering herself to the ground. Pt landed on her knees, was there for a while before fire was able to get to her. Endorses knee pain. No other concerns reported.

## 2024-01-08 NOTE — ED Provider Notes (Signed)
 [place] EMERGENCY DEPARTMENT AT [facility] Provider Note   CSN: [id number] Arrival date & time: 01/08/24  1338     Patient presents with: Fall and Knee Pain   Laurie Hunter is a 78 y.o. female.   78 year old female presenting with fall.  Patient ambulates around her home with help of a walker, she is unsure if her foot gave out or if she got caught on a rug, but she subsequently stumbled forward landing on her knees while holding onto her walker.  She was unable to get up on her own and had to call EMS for assistance.  She is on Plavix .  Currently complaining of pain in her right knee, left knee, right hip.  She denies hitting her head/loss of consciousness.  Patient has severe lymphedema in the left lower extremity > right lower extremity, she is scheduled to be evaluated by lymphedema clinic.  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   Fall  Knee Pain      Prior to Admission medications   Medication Sig Start Date End Date Taking? Authorizing Provider  acetaminophen  (TYLENOL ) 500 MG tablet Take 500-1,000 mg by mouth every 6 (six) hours as needed for mild pain (pain score 1-3) or moderate pain (pain score 4-6).    [provider]  allopurinol  (ZYLOPRIM ) 100 MG tablet Take 100 mg by mouth in the morning.    [provider]  amLODipine  (NORVASC ) 10 MG tablet Take 10 mg by mouth at bedtime.    [provider]  aspirin  81 MG tablet Take 81 mg by mouth in the morning.    [provider]  atenolol (TENORMIN) 50 MG tablet Take 50 mg by mouth in the morning.    [provider]  Bismuth  Subsalicylate (KAOPECTATE PO) Take 1-2 tablets by mouth as needed.    [provider]  bismuth  subsalicylate (PEPTO BISMOL) 262 MG chewable tablet Chew 524 mg by mouth as needed for indigestion or diarrhea or loose  stools.    [provider]  carbamide peroxide (DEBROX) 6.5 % OTIC solution Place 5 drops into the right ear 2 (two) times daily. 11/01/23   Cindy Garnette POUR, MD  clopidogrel  (PLAVIX ) 75 MG tablet Take 75 mg by mouth in the morning.    [provider]  colchicine 0.6 MG tablet Take 0.6 mg by mouth 2 (two) times daily.    [provider]  diphenhydrAMINE  (BENADRYL ) 25 MG tablet Take 50 mg by mouth at bedtime. 10/17/12   [provider]  famotidine  (PEPCID ) 20 MG tablet Take 1 tablet (20 mg total) by mouth 2 (two) times daily. 10/17/12   Cleotilde Rogue, MD  FEROSUL 325 (65 Fe) MG tablet Take 325 mg by mouth in the morning. 03/08/20   [provider]  Loperamide HCl (IMODIUM PO) Take 1 tablet by mouth at bedtime as needed.    [provider]  melatonin 5 MG TABS Take 5 mg by mouth at bedtime as needed.    [provider]  metFORMIN (GLUCOPHAGE) 500 MG tablet Take 500 mg by mouth in the morning.    [provider]    Allergies: Erythromycin, Lisinopril, and Penicillins    Review of Systems  Updated Vital Signs BP (!) 153/79 (BP Location: Left Arm)   Pulse 74   Temp 98 F (36.7  C) (Oral)   Resp 18   Ht 4' 11 (1.499 m)   Wt 102.5 kg   SpO2 100%   BMI 45.64 kg/m     Physical Exam Vitals and nursing note reviewed.   Eyes:     Extraocular Movements: Extraocular movements intact.    Cardiovascular:     Rate and Rhythm: Normal rate.  Pulmonary:     Effort: Pulmonary effort is normal.   Musculoskeletal:     Cervical back: Normal range of motion.     Comments: Right knee: Tenderness to palpation of anterior aspect of right knee with mild erythema, no obvious deformity/dislocation, limited range of motion secondary to lymphedema but patient is able to flex the knee with assistance. Left knee:  Tenderness to palpation of anterior aspect of left knee, no obvious deformity/dislocation, unable to assess range of motion  secondary to degree of lymphedema. Right hip: Tenderness to deep palpation   Skin:    General: Skin is warm and dry.     Comments: Lymphedema of bilateral LE's, R>>>L   Neurological:     General: No focal deficit present.     Mental Status: She is alert and oriented to person, place, and time.    (all labs ordered are listed, but only abnormal results are displayed) Labs Reviewed - No data to display  EKG: None  Radiology: DG Hip Unilat W or Wo Pelvis 2-3 Views Right Result Date: 01/08/2024 CLINICAL DATA:  Fall EXAM: DG HIP (WITH OR WITHOUT PELVIS) 2-3V RIGHT COMPARISON:  None Available. FINDINGS: There is no evidence of hip fracture or dislocation. There is no evidence of arthropathy or other focal bone abnormality. IMPRESSION: Negative. Electronically Signed   By: Greig Pique M.D.   On: 01/08/2024 16:58   DG Knee 2 Views Left Result Date: 01/08/2024 CLINICAL DATA:  Fall EXAM: LEFT KNEE - 1-2 VIEW COMPARISON:  None FINDINGS: Lateral view is significantly limited secondary to patient positioning and technique. There is no gross fracture or dislocation. Cannot assess for joint effusion. There is moderate tricompartmental degenerative change with osteophyte formation and joint space narrowing. This is most significant in the medial compartment. IMPRESSION: 1. Limited lateral view. No gross fracture or dislocation. 2. Moderate tricompartmental degenerative change. Electronically Signed   By: Greig Pique M.D.   On: 01/08/2024 16:57   DG Knee 2 Views Right Result Date: 01/08/2024 CLINICAL DATA:  Fall EXAM: RIGHT KNEE - 1-2 VIEW COMPARISON:  None Available. FINDINGS: No evidence of fracture, dislocation, or joint effusion. There is tricompartmental joint space narrowing and osteophyte formation compatible with moderate degenerative change. This is most significant in the lateral compartment. IMPRESSION: 1. No acute fracture or dislocation. 2. Moderate degenerative changes. Electronically  Signed   By: Greig Pique M.D.   On: 01/08/2024 16:56     Procedures   Medications Ordered in the ED - No data to display                                  Medical Decision Making This patient presents to the ED for concern of fall, this involves an extensive number of treatment options, and is a complaint that carries with it a high risk of complications and morbidity.  The differential diagnosis includes fracture, dislocation, contusion, musculoskeletal pain/strain/sprain.   Co morbidities that complicate the patient evaluation  Lymphedema, history of CVA, on Plavix    Additional history obtained:  Additional history  obtained from record review External records from outside source obtained and reviewed including prior ED notes   Imaging Studies ordered:  I ordered imaging studies including x-ray of both knees, right hip/pelvis I independently visualized and interpreted imaging which showed  - Right knee: 1. No acute fracture or dislocation.2. Moderate degenerative changes. -Left knee: 1. Limited lateral view. No gross fracture or dislocation. 2. Moderate tricompartmental degenerative change. -Right hip/pelvis: Negative I agree with the radiologist interpretation   Cardiac Monitoring: / EKG:  The patient was maintained on a cardiac monitor.  I personally viewed and interpreted the cardiac monitored which showed an underlying rhythm of: NSR   Problem List / ED Course / Critical interventions / Medication management I have reviewed the patients home medicines and have made adjustments as needed   Test / Admission - Considered:  Physical exam was notable as above.  X-ray imaging is reassuring, see above for interpretation.  At this time I do feel that the patient is appropriate for discharge.  I encouraged her to continue to work with physical therapy in order to improve her mobility while ambulating throughout her home, encouraged her to continue to use her walker as  instructed.  She is planning to follow-up with a lymphedema clinic soon.  I encouraged the patient to follow-up with her primary care provider later this week if her symptoms persist.  Return precautions discussed.  She is appropriate discharge at this time.    Amount and/or Complexity of Data Reviewed Radiology: ordered.        Final diagnoses:  Fall, initial encounter    ED Discharge Orders     None          Glendia Rocky LOISE DEVONNA 01/08/24 1911    Melvenia Motto, MD 01/08/24 2251

## 2024-01-09 DIAGNOSIS — I69354 Hemiplegia and hemiparesis following cerebral infarction affecting left non-dominant side: Secondary | ICD-10-CM | POA: Diagnosis not present

## 2024-01-09 DIAGNOSIS — M109 Gout, unspecified: Secondary | ICD-10-CM | POA: Diagnosis not present

## 2024-01-09 DIAGNOSIS — N183 Chronic kidney disease, stage 3 unspecified: Secondary | ICD-10-CM | POA: Diagnosis not present

## 2024-01-09 DIAGNOSIS — K219 Gastro-esophageal reflux disease without esophagitis: Secondary | ICD-10-CM | POA: Diagnosis not present

## 2024-01-09 DIAGNOSIS — Z9181 History of falling: Secondary | ICD-10-CM | POA: Diagnosis not present

## 2024-01-09 DIAGNOSIS — N179 Acute kidney failure, unspecified: Secondary | ICD-10-CM | POA: Diagnosis not present

## 2024-01-09 DIAGNOSIS — H6123 Impacted cerumen, bilateral: Secondary | ICD-10-CM | POA: Diagnosis not present

## 2024-01-09 DIAGNOSIS — I890 Lymphedema, not elsewhere classified: Secondary | ICD-10-CM | POA: Diagnosis not present

## 2024-01-09 DIAGNOSIS — Z7902 Long term (current) use of antithrombotics/antiplatelets: Secondary | ICD-10-CM | POA: Diagnosis not present

## 2024-01-09 DIAGNOSIS — Z90710 Acquired absence of both cervix and uterus: Secondary | ICD-10-CM | POA: Diagnosis not present

## 2024-01-09 DIAGNOSIS — Z7984 Long term (current) use of oral hypoglycemic drugs: Secondary | ICD-10-CM | POA: Diagnosis not present

## 2024-01-09 DIAGNOSIS — R197 Diarrhea, unspecified: Secondary | ICD-10-CM | POA: Diagnosis not present

## 2024-01-09 DIAGNOSIS — D631 Anemia in chronic kidney disease: Secondary | ICD-10-CM | POA: Diagnosis not present

## 2024-01-09 DIAGNOSIS — E1122 Type 2 diabetes mellitus with diabetic chronic kidney disease: Secondary | ICD-10-CM | POA: Diagnosis not present

## 2024-01-09 DIAGNOSIS — E785 Hyperlipidemia, unspecified: Secondary | ICD-10-CM | POA: Diagnosis not present

## 2024-01-09 DIAGNOSIS — I129 Hypertensive chronic kidney disease with stage 1 through stage 4 chronic kidney disease, or unspecified chronic kidney disease: Secondary | ICD-10-CM | POA: Diagnosis not present

## 2024-01-13 DIAGNOSIS — E1140 Type 2 diabetes mellitus with diabetic neuropathy, unspecified: Secondary | ICD-10-CM | POA: Diagnosis not present

## 2024-01-13 DIAGNOSIS — N1831 Chronic kidney disease, stage 3a: Secondary | ICD-10-CM | POA: Diagnosis not present

## 2024-01-13 DIAGNOSIS — I10 Essential (primary) hypertension: Secondary | ICD-10-CM | POA: Diagnosis not present

## 2024-01-14 DIAGNOSIS — I10 Essential (primary) hypertension: Secondary | ICD-10-CM | POA: Diagnosis not present

## 2024-01-14 DIAGNOSIS — I69354 Hemiplegia and hemiparesis following cerebral infarction affecting left non-dominant side: Secondary | ICD-10-CM | POA: Diagnosis not present

## 2024-01-14 DIAGNOSIS — H6123 Impacted cerumen, bilateral: Secondary | ICD-10-CM | POA: Diagnosis not present

## 2024-01-14 DIAGNOSIS — E785 Hyperlipidemia, unspecified: Secondary | ICD-10-CM | POA: Diagnosis not present

## 2024-01-14 DIAGNOSIS — Z7984 Long term (current) use of oral hypoglycemic drugs: Secondary | ICD-10-CM | POA: Diagnosis not present

## 2024-01-14 DIAGNOSIS — E1140 Type 2 diabetes mellitus with diabetic neuropathy, unspecified: Secondary | ICD-10-CM | POA: Diagnosis not present

## 2024-01-14 DIAGNOSIS — E1136 Type 2 diabetes mellitus with diabetic cataract: Secondary | ICD-10-CM | POA: Diagnosis not present

## 2024-01-14 DIAGNOSIS — I129 Hypertensive chronic kidney disease with stage 1 through stage 4 chronic kidney disease, or unspecified chronic kidney disease: Secondary | ICD-10-CM | POA: Diagnosis not present

## 2024-01-14 DIAGNOSIS — I890 Lymphedema, not elsewhere classified: Secondary | ICD-10-CM | POA: Diagnosis not present

## 2024-01-14 DIAGNOSIS — M109 Gout, unspecified: Secondary | ICD-10-CM | POA: Diagnosis not present

## 2024-01-14 DIAGNOSIS — K219 Gastro-esophageal reflux disease without esophagitis: Secondary | ICD-10-CM | POA: Diagnosis not present

## 2024-01-14 DIAGNOSIS — Z90710 Acquired absence of both cervix and uterus: Secondary | ICD-10-CM | POA: Diagnosis not present

## 2024-01-14 DIAGNOSIS — D631 Anemia in chronic kidney disease: Secondary | ICD-10-CM | POA: Diagnosis not present

## 2024-01-14 DIAGNOSIS — E1122 Type 2 diabetes mellitus with diabetic chronic kidney disease: Secondary | ICD-10-CM | POA: Diagnosis not present

## 2024-01-14 DIAGNOSIS — R197 Diarrhea, unspecified: Secondary | ICD-10-CM | POA: Diagnosis not present

## 2024-01-14 DIAGNOSIS — E782 Mixed hyperlipidemia: Secondary | ICD-10-CM | POA: Diagnosis not present

## 2024-01-14 DIAGNOSIS — N1831 Chronic kidney disease, stage 3a: Secondary | ICD-10-CM | POA: Diagnosis not present

## 2024-01-14 DIAGNOSIS — Z7902 Long term (current) use of antithrombotics/antiplatelets: Secondary | ICD-10-CM | POA: Diagnosis not present

## 2024-01-14 DIAGNOSIS — N183 Chronic kidney disease, stage 3 unspecified: Secondary | ICD-10-CM | POA: Diagnosis not present

## 2024-01-14 DIAGNOSIS — Z9181 History of falling: Secondary | ICD-10-CM | POA: Diagnosis not present

## 2024-01-14 DIAGNOSIS — N179 Acute kidney failure, unspecified: Secondary | ICD-10-CM | POA: Diagnosis not present

## 2024-01-16 DIAGNOSIS — Z9181 History of falling: Secondary | ICD-10-CM | POA: Diagnosis not present

## 2024-01-16 DIAGNOSIS — K219 Gastro-esophageal reflux disease without esophagitis: Secondary | ICD-10-CM | POA: Diagnosis not present

## 2024-01-16 DIAGNOSIS — N179 Acute kidney failure, unspecified: Secondary | ICD-10-CM | POA: Diagnosis not present

## 2024-01-16 DIAGNOSIS — E785 Hyperlipidemia, unspecified: Secondary | ICD-10-CM | POA: Diagnosis not present

## 2024-01-16 DIAGNOSIS — I890 Lymphedema, not elsewhere classified: Secondary | ICD-10-CM | POA: Diagnosis not present

## 2024-01-16 DIAGNOSIS — N183 Chronic kidney disease, stage 3 unspecified: Secondary | ICD-10-CM | POA: Diagnosis not present

## 2024-01-16 DIAGNOSIS — Z7984 Long term (current) use of oral hypoglycemic drugs: Secondary | ICD-10-CM | POA: Diagnosis not present

## 2024-01-16 DIAGNOSIS — R197 Diarrhea, unspecified: Secondary | ICD-10-CM | POA: Diagnosis not present

## 2024-01-16 DIAGNOSIS — Z90710 Acquired absence of both cervix and uterus: Secondary | ICD-10-CM | POA: Diagnosis not present

## 2024-01-16 DIAGNOSIS — I69354 Hemiplegia and hemiparesis following cerebral infarction affecting left non-dominant side: Secondary | ICD-10-CM | POA: Diagnosis not present

## 2024-01-16 DIAGNOSIS — Z7902 Long term (current) use of antithrombotics/antiplatelets: Secondary | ICD-10-CM | POA: Diagnosis not present

## 2024-01-16 DIAGNOSIS — I129 Hypertensive chronic kidney disease with stage 1 through stage 4 chronic kidney disease, or unspecified chronic kidney disease: Secondary | ICD-10-CM | POA: Diagnosis not present

## 2024-01-16 DIAGNOSIS — D631 Anemia in chronic kidney disease: Secondary | ICD-10-CM | POA: Diagnosis not present

## 2024-01-16 DIAGNOSIS — E1122 Type 2 diabetes mellitus with diabetic chronic kidney disease: Secondary | ICD-10-CM | POA: Diagnosis not present

## 2024-01-16 DIAGNOSIS — M109 Gout, unspecified: Secondary | ICD-10-CM | POA: Diagnosis not present

## 2024-01-16 DIAGNOSIS — H6123 Impacted cerumen, bilateral: Secondary | ICD-10-CM | POA: Diagnosis not present

## 2024-01-20 DIAGNOSIS — K219 Gastro-esophageal reflux disease without esophagitis: Secondary | ICD-10-CM | POA: Diagnosis not present

## 2024-01-20 DIAGNOSIS — N183 Chronic kidney disease, stage 3 unspecified: Secondary | ICD-10-CM | POA: Diagnosis not present

## 2024-01-20 DIAGNOSIS — Z90710 Acquired absence of both cervix and uterus: Secondary | ICD-10-CM | POA: Diagnosis not present

## 2024-01-20 DIAGNOSIS — Z9181 History of falling: Secondary | ICD-10-CM | POA: Diagnosis not present

## 2024-01-20 DIAGNOSIS — Z7984 Long term (current) use of oral hypoglycemic drugs: Secondary | ICD-10-CM | POA: Diagnosis not present

## 2024-01-20 DIAGNOSIS — Z7902 Long term (current) use of antithrombotics/antiplatelets: Secondary | ICD-10-CM | POA: Diagnosis not present

## 2024-01-20 DIAGNOSIS — N179 Acute kidney failure, unspecified: Secondary | ICD-10-CM | POA: Diagnosis not present

## 2024-01-20 DIAGNOSIS — I129 Hypertensive chronic kidney disease with stage 1 through stage 4 chronic kidney disease, or unspecified chronic kidney disease: Secondary | ICD-10-CM | POA: Diagnosis not present

## 2024-01-20 DIAGNOSIS — E785 Hyperlipidemia, unspecified: Secondary | ICD-10-CM | POA: Diagnosis not present

## 2024-01-20 DIAGNOSIS — R197 Diarrhea, unspecified: Secondary | ICD-10-CM | POA: Diagnosis not present

## 2024-01-20 DIAGNOSIS — E1122 Type 2 diabetes mellitus with diabetic chronic kidney disease: Secondary | ICD-10-CM | POA: Diagnosis not present

## 2024-01-20 DIAGNOSIS — I69354 Hemiplegia and hemiparesis following cerebral infarction affecting left non-dominant side: Secondary | ICD-10-CM | POA: Diagnosis not present

## 2024-01-20 DIAGNOSIS — I890 Lymphedema, not elsewhere classified: Secondary | ICD-10-CM | POA: Diagnosis not present

## 2024-01-20 DIAGNOSIS — M109 Gout, unspecified: Secondary | ICD-10-CM | POA: Diagnosis not present

## 2024-01-20 DIAGNOSIS — H6123 Impacted cerumen, bilateral: Secondary | ICD-10-CM | POA: Diagnosis not present

## 2024-01-20 DIAGNOSIS — D631 Anemia in chronic kidney disease: Secondary | ICD-10-CM | POA: Diagnosis not present

## 2024-01-29 DIAGNOSIS — D631 Anemia in chronic kidney disease: Secondary | ICD-10-CM | POA: Diagnosis not present

## 2024-01-29 DIAGNOSIS — I890 Lymphedema, not elsewhere classified: Secondary | ICD-10-CM | POA: Diagnosis not present

## 2024-01-29 DIAGNOSIS — R197 Diarrhea, unspecified: Secondary | ICD-10-CM | POA: Diagnosis not present

## 2024-01-29 DIAGNOSIS — E119 Type 2 diabetes mellitus without complications: Secondary | ICD-10-CM | POA: Diagnosis not present

## 2024-01-29 DIAGNOSIS — I69354 Hemiplegia and hemiparesis following cerebral infarction affecting left non-dominant side: Secondary | ICD-10-CM | POA: Diagnosis not present

## 2024-01-29 DIAGNOSIS — E1136 Type 2 diabetes mellitus with diabetic cataract: Secondary | ICD-10-CM | POA: Diagnosis not present

## 2024-01-29 DIAGNOSIS — E1122 Type 2 diabetes mellitus with diabetic chronic kidney disease: Secondary | ICD-10-CM | POA: Diagnosis not present

## 2024-01-29 DIAGNOSIS — I129 Hypertensive chronic kidney disease with stage 1 through stage 4 chronic kidney disease, or unspecified chronic kidney disease: Secondary | ICD-10-CM | POA: Diagnosis not present

## 2024-01-29 DIAGNOSIS — N179 Acute kidney failure, unspecified: Secondary | ICD-10-CM | POA: Diagnosis not present

## 2024-01-29 DIAGNOSIS — E785 Hyperlipidemia, unspecified: Secondary | ICD-10-CM | POA: Diagnosis not present

## 2024-01-29 DIAGNOSIS — M109 Gout, unspecified: Secondary | ICD-10-CM | POA: Diagnosis not present

## 2024-01-29 DIAGNOSIS — M6281 Muscle weakness (generalized): Secondary | ICD-10-CM | POA: Diagnosis not present

## 2024-01-29 DIAGNOSIS — N182 Chronic kidney disease, stage 2 (mild): Secondary | ICD-10-CM | POA: Diagnosis not present

## 2024-01-29 DIAGNOSIS — Z90710 Acquired absence of both cervix and uterus: Secondary | ICD-10-CM | POA: Diagnosis not present

## 2024-01-29 DIAGNOSIS — Z7984 Long term (current) use of oral hypoglycemic drugs: Secondary | ICD-10-CM | POA: Diagnosis not present

## 2024-01-29 DIAGNOSIS — N183 Chronic kidney disease, stage 3 unspecified: Secondary | ICD-10-CM | POA: Diagnosis not present

## 2024-01-29 DIAGNOSIS — Z9181 History of falling: Secondary | ICD-10-CM | POA: Diagnosis not present

## 2024-01-29 DIAGNOSIS — K219 Gastro-esophageal reflux disease without esophagitis: Secondary | ICD-10-CM | POA: Diagnosis not present

## 2024-01-29 DIAGNOSIS — H6123 Impacted cerumen, bilateral: Secondary | ICD-10-CM | POA: Diagnosis not present

## 2024-01-29 DIAGNOSIS — Z7902 Long term (current) use of antithrombotics/antiplatelets: Secondary | ICD-10-CM | POA: Diagnosis not present

## 2024-01-30 DIAGNOSIS — N183 Chronic kidney disease, stage 3 unspecified: Secondary | ICD-10-CM | POA: Diagnosis not present

## 2024-01-30 DIAGNOSIS — E785 Hyperlipidemia, unspecified: Secondary | ICD-10-CM | POA: Diagnosis not present

## 2024-01-30 DIAGNOSIS — I129 Hypertensive chronic kidney disease with stage 1 through stage 4 chronic kidney disease, or unspecified chronic kidney disease: Secondary | ICD-10-CM | POA: Diagnosis not present

## 2024-01-30 DIAGNOSIS — I69354 Hemiplegia and hemiparesis following cerebral infarction affecting left non-dominant side: Secondary | ICD-10-CM | POA: Diagnosis not present

## 2024-01-30 DIAGNOSIS — Z9181 History of falling: Secondary | ICD-10-CM | POA: Diagnosis not present

## 2024-01-30 DIAGNOSIS — K219 Gastro-esophageal reflux disease without esophagitis: Secondary | ICD-10-CM | POA: Diagnosis not present

## 2024-01-30 DIAGNOSIS — R197 Diarrhea, unspecified: Secondary | ICD-10-CM | POA: Diagnosis not present

## 2024-01-30 DIAGNOSIS — E1136 Type 2 diabetes mellitus with diabetic cataract: Secondary | ICD-10-CM | POA: Diagnosis not present

## 2024-01-30 DIAGNOSIS — E1122 Type 2 diabetes mellitus with diabetic chronic kidney disease: Secondary | ICD-10-CM | POA: Diagnosis not present

## 2024-01-30 DIAGNOSIS — D631 Anemia in chronic kidney disease: Secondary | ICD-10-CM | POA: Diagnosis not present

## 2024-01-30 DIAGNOSIS — Z7902 Long term (current) use of antithrombotics/antiplatelets: Secondary | ICD-10-CM | POA: Diagnosis not present

## 2024-01-30 DIAGNOSIS — I890 Lymphedema, not elsewhere classified: Secondary | ICD-10-CM | POA: Diagnosis not present

## 2024-01-30 DIAGNOSIS — Z7984 Long term (current) use of oral hypoglycemic drugs: Secondary | ICD-10-CM | POA: Diagnosis not present

## 2024-01-30 DIAGNOSIS — M109 Gout, unspecified: Secondary | ICD-10-CM | POA: Diagnosis not present

## 2024-01-30 DIAGNOSIS — N179 Acute kidney failure, unspecified: Secondary | ICD-10-CM | POA: Diagnosis not present

## 2024-01-30 DIAGNOSIS — Z90710 Acquired absence of both cervix and uterus: Secondary | ICD-10-CM | POA: Diagnosis not present

## 2024-01-30 DIAGNOSIS — H6123 Impacted cerumen, bilateral: Secondary | ICD-10-CM | POA: Diagnosis not present

## 2024-02-01 DIAGNOSIS — I69354 Hemiplegia and hemiparesis following cerebral infarction affecting left non-dominant side: Secondary | ICD-10-CM | POA: Diagnosis not present

## 2024-02-01 DIAGNOSIS — M6281 Muscle weakness (generalized): Secondary | ICD-10-CM | POA: Diagnosis not present

## 2024-02-01 DIAGNOSIS — E119 Type 2 diabetes mellitus without complications: Secondary | ICD-10-CM | POA: Diagnosis not present

## 2024-02-01 DIAGNOSIS — N182 Chronic kidney disease, stage 2 (mild): Secondary | ICD-10-CM | POA: Diagnosis not present

## 2024-02-03 DIAGNOSIS — I69354 Hemiplegia and hemiparesis following cerebral infarction affecting left non-dominant side: Secondary | ICD-10-CM | POA: Diagnosis not present

## 2024-02-03 DIAGNOSIS — I890 Lymphedema, not elsewhere classified: Secondary | ICD-10-CM | POA: Diagnosis not present

## 2024-02-04 DIAGNOSIS — N183 Chronic kidney disease, stage 3 unspecified: Secondary | ICD-10-CM | POA: Diagnosis not present

## 2024-02-04 DIAGNOSIS — N179 Acute kidney failure, unspecified: Secondary | ICD-10-CM | POA: Diagnosis not present

## 2024-02-04 DIAGNOSIS — I890 Lymphedema, not elsewhere classified: Secondary | ICD-10-CM | POA: Diagnosis not present

## 2024-02-04 DIAGNOSIS — R197 Diarrhea, unspecified: Secondary | ICD-10-CM | POA: Diagnosis not present

## 2024-02-04 DIAGNOSIS — E1136 Type 2 diabetes mellitus with diabetic cataract: Secondary | ICD-10-CM | POA: Diagnosis not present

## 2024-02-04 DIAGNOSIS — Z90710 Acquired absence of both cervix and uterus: Secondary | ICD-10-CM | POA: Diagnosis not present

## 2024-02-04 DIAGNOSIS — M109 Gout, unspecified: Secondary | ICD-10-CM | POA: Diagnosis not present

## 2024-02-04 DIAGNOSIS — H6123 Impacted cerumen, bilateral: Secondary | ICD-10-CM | POA: Diagnosis not present

## 2024-02-04 DIAGNOSIS — D631 Anemia in chronic kidney disease: Secondary | ICD-10-CM | POA: Diagnosis not present

## 2024-02-04 DIAGNOSIS — Z7984 Long term (current) use of oral hypoglycemic drugs: Secondary | ICD-10-CM | POA: Diagnosis not present

## 2024-02-04 DIAGNOSIS — E1122 Type 2 diabetes mellitus with diabetic chronic kidney disease: Secondary | ICD-10-CM | POA: Diagnosis not present

## 2024-02-04 DIAGNOSIS — Z9181 History of falling: Secondary | ICD-10-CM | POA: Diagnosis not present

## 2024-02-04 DIAGNOSIS — E785 Hyperlipidemia, unspecified: Secondary | ICD-10-CM | POA: Diagnosis not present

## 2024-02-04 DIAGNOSIS — Z7902 Long term (current) use of antithrombotics/antiplatelets: Secondary | ICD-10-CM | POA: Diagnosis not present

## 2024-02-04 DIAGNOSIS — I129 Hypertensive chronic kidney disease with stage 1 through stage 4 chronic kidney disease, or unspecified chronic kidney disease: Secondary | ICD-10-CM | POA: Diagnosis not present

## 2024-02-04 DIAGNOSIS — I69354 Hemiplegia and hemiparesis following cerebral infarction affecting left non-dominant side: Secondary | ICD-10-CM | POA: Diagnosis not present

## 2024-02-04 DIAGNOSIS — K219 Gastro-esophageal reflux disease without esophagitis: Secondary | ICD-10-CM | POA: Diagnosis not present

## 2024-02-05 DIAGNOSIS — D631 Anemia in chronic kidney disease: Secondary | ICD-10-CM | POA: Diagnosis not present

## 2024-02-05 DIAGNOSIS — K219 Gastro-esophageal reflux disease without esophagitis: Secondary | ICD-10-CM | POA: Diagnosis not present

## 2024-02-05 DIAGNOSIS — M109 Gout, unspecified: Secondary | ICD-10-CM | POA: Diagnosis not present

## 2024-02-05 DIAGNOSIS — Z7984 Long term (current) use of oral hypoglycemic drugs: Secondary | ICD-10-CM | POA: Diagnosis not present

## 2024-02-05 DIAGNOSIS — R197 Diarrhea, unspecified: Secondary | ICD-10-CM | POA: Diagnosis not present

## 2024-02-05 DIAGNOSIS — Z7902 Long term (current) use of antithrombotics/antiplatelets: Secondary | ICD-10-CM | POA: Diagnosis not present

## 2024-02-05 DIAGNOSIS — H6123 Impacted cerumen, bilateral: Secondary | ICD-10-CM | POA: Diagnosis not present

## 2024-02-05 DIAGNOSIS — N183 Chronic kidney disease, stage 3 unspecified: Secondary | ICD-10-CM | POA: Diagnosis not present

## 2024-02-05 DIAGNOSIS — Z9181 History of falling: Secondary | ICD-10-CM | POA: Diagnosis not present

## 2024-02-05 DIAGNOSIS — Z90710 Acquired absence of both cervix and uterus: Secondary | ICD-10-CM | POA: Diagnosis not present

## 2024-02-05 DIAGNOSIS — E785 Hyperlipidemia, unspecified: Secondary | ICD-10-CM | POA: Diagnosis not present

## 2024-02-05 DIAGNOSIS — E1122 Type 2 diabetes mellitus with diabetic chronic kidney disease: Secondary | ICD-10-CM | POA: Diagnosis not present

## 2024-02-05 DIAGNOSIS — E1136 Type 2 diabetes mellitus with diabetic cataract: Secondary | ICD-10-CM | POA: Diagnosis not present

## 2024-02-05 DIAGNOSIS — I129 Hypertensive chronic kidney disease with stage 1 through stage 4 chronic kidney disease, or unspecified chronic kidney disease: Secondary | ICD-10-CM | POA: Diagnosis not present

## 2024-02-05 DIAGNOSIS — I69354 Hemiplegia and hemiparesis following cerebral infarction affecting left non-dominant side: Secondary | ICD-10-CM | POA: Diagnosis not present

## 2024-02-05 DIAGNOSIS — N179 Acute kidney failure, unspecified: Secondary | ICD-10-CM | POA: Diagnosis not present

## 2024-02-05 DIAGNOSIS — I890 Lymphedema, not elsewhere classified: Secondary | ICD-10-CM | POA: Diagnosis not present

## 2024-02-10 DIAGNOSIS — I129 Hypertensive chronic kidney disease with stage 1 through stage 4 chronic kidney disease, or unspecified chronic kidney disease: Secondary | ICD-10-CM | POA: Diagnosis not present

## 2024-02-10 DIAGNOSIS — D631 Anemia in chronic kidney disease: Secondary | ICD-10-CM | POA: Diagnosis not present

## 2024-02-10 DIAGNOSIS — N183 Chronic kidney disease, stage 3 unspecified: Secondary | ICD-10-CM | POA: Diagnosis not present

## 2024-02-10 DIAGNOSIS — N179 Acute kidney failure, unspecified: Secondary | ICD-10-CM | POA: Diagnosis not present

## 2024-02-10 DIAGNOSIS — Z7902 Long term (current) use of antithrombotics/antiplatelets: Secondary | ICD-10-CM | POA: Diagnosis not present

## 2024-02-10 DIAGNOSIS — R197 Diarrhea, unspecified: Secondary | ICD-10-CM | POA: Diagnosis not present

## 2024-02-10 DIAGNOSIS — M109 Gout, unspecified: Secondary | ICD-10-CM | POA: Diagnosis not present

## 2024-02-10 DIAGNOSIS — I890 Lymphedema, not elsewhere classified: Secondary | ICD-10-CM | POA: Diagnosis not present

## 2024-02-10 DIAGNOSIS — Z90710 Acquired absence of both cervix and uterus: Secondary | ICD-10-CM | POA: Diagnosis not present

## 2024-02-10 DIAGNOSIS — E1122 Type 2 diabetes mellitus with diabetic chronic kidney disease: Secondary | ICD-10-CM | POA: Diagnosis not present

## 2024-02-10 DIAGNOSIS — E1136 Type 2 diabetes mellitus with diabetic cataract: Secondary | ICD-10-CM | POA: Diagnosis not present

## 2024-02-10 DIAGNOSIS — Z9181 History of falling: Secondary | ICD-10-CM | POA: Diagnosis not present

## 2024-02-10 DIAGNOSIS — E785 Hyperlipidemia, unspecified: Secondary | ICD-10-CM | POA: Diagnosis not present

## 2024-02-10 DIAGNOSIS — I69354 Hemiplegia and hemiparesis following cerebral infarction affecting left non-dominant side: Secondary | ICD-10-CM | POA: Diagnosis not present

## 2024-02-10 DIAGNOSIS — Z7984 Long term (current) use of oral hypoglycemic drugs: Secondary | ICD-10-CM | POA: Diagnosis not present

## 2024-02-10 DIAGNOSIS — K219 Gastro-esophageal reflux disease without esophagitis: Secondary | ICD-10-CM | POA: Diagnosis not present

## 2024-02-10 DIAGNOSIS — H6123 Impacted cerumen, bilateral: Secondary | ICD-10-CM | POA: Diagnosis not present

## 2024-02-13 DIAGNOSIS — N179 Acute kidney failure, unspecified: Secondary | ICD-10-CM | POA: Diagnosis not present

## 2024-02-13 DIAGNOSIS — D631 Anemia in chronic kidney disease: Secondary | ICD-10-CM | POA: Diagnosis not present

## 2024-02-13 DIAGNOSIS — H6123 Impacted cerumen, bilateral: Secondary | ICD-10-CM | POA: Diagnosis not present

## 2024-02-13 DIAGNOSIS — E785 Hyperlipidemia, unspecified: Secondary | ICD-10-CM | POA: Diagnosis not present

## 2024-02-13 DIAGNOSIS — R197 Diarrhea, unspecified: Secondary | ICD-10-CM | POA: Diagnosis not present

## 2024-02-13 DIAGNOSIS — N1831 Chronic kidney disease, stage 3a: Secondary | ICD-10-CM | POA: Diagnosis not present

## 2024-02-13 DIAGNOSIS — Z7984 Long term (current) use of oral hypoglycemic drugs: Secondary | ICD-10-CM | POA: Diagnosis not present

## 2024-02-13 DIAGNOSIS — I129 Hypertensive chronic kidney disease with stage 1 through stage 4 chronic kidney disease, or unspecified chronic kidney disease: Secondary | ICD-10-CM | POA: Diagnosis not present

## 2024-02-13 DIAGNOSIS — Z7902 Long term (current) use of antithrombotics/antiplatelets: Secondary | ICD-10-CM | POA: Diagnosis not present

## 2024-02-13 DIAGNOSIS — I69354 Hemiplegia and hemiparesis following cerebral infarction affecting left non-dominant side: Secondary | ICD-10-CM | POA: Diagnosis not present

## 2024-02-13 DIAGNOSIS — I890 Lymphedema, not elsewhere classified: Secondary | ICD-10-CM | POA: Diagnosis not present

## 2024-02-13 DIAGNOSIS — E1140 Type 2 diabetes mellitus with diabetic neuropathy, unspecified: Secondary | ICD-10-CM | POA: Diagnosis not present

## 2024-02-13 DIAGNOSIS — M109 Gout, unspecified: Secondary | ICD-10-CM | POA: Diagnosis not present

## 2024-02-13 DIAGNOSIS — Z90710 Acquired absence of both cervix and uterus: Secondary | ICD-10-CM | POA: Diagnosis not present

## 2024-02-13 DIAGNOSIS — E1136 Type 2 diabetes mellitus with diabetic cataract: Secondary | ICD-10-CM | POA: Diagnosis not present

## 2024-02-13 DIAGNOSIS — E1122 Type 2 diabetes mellitus with diabetic chronic kidney disease: Secondary | ICD-10-CM | POA: Diagnosis not present

## 2024-02-13 DIAGNOSIS — Z9181 History of falling: Secondary | ICD-10-CM | POA: Diagnosis not present

## 2024-02-13 DIAGNOSIS — I10 Essential (primary) hypertension: Secondary | ICD-10-CM | POA: Diagnosis not present

## 2024-02-13 DIAGNOSIS — K219 Gastro-esophageal reflux disease without esophagitis: Secondary | ICD-10-CM | POA: Diagnosis not present

## 2024-02-13 DIAGNOSIS — N183 Chronic kidney disease, stage 3 unspecified: Secondary | ICD-10-CM | POA: Diagnosis not present

## 2024-02-17 DIAGNOSIS — I890 Lymphedema, not elsewhere classified: Secondary | ICD-10-CM | POA: Diagnosis not present

## 2024-02-17 DIAGNOSIS — E1136 Type 2 diabetes mellitus with diabetic cataract: Secondary | ICD-10-CM | POA: Diagnosis not present

## 2024-02-17 DIAGNOSIS — E1122 Type 2 diabetes mellitus with diabetic chronic kidney disease: Secondary | ICD-10-CM | POA: Diagnosis not present

## 2024-02-17 DIAGNOSIS — H6123 Impacted cerumen, bilateral: Secondary | ICD-10-CM | POA: Diagnosis not present

## 2024-02-17 DIAGNOSIS — Z7984 Long term (current) use of oral hypoglycemic drugs: Secondary | ICD-10-CM | POA: Diagnosis not present

## 2024-02-17 DIAGNOSIS — D631 Anemia in chronic kidney disease: Secondary | ICD-10-CM | POA: Diagnosis not present

## 2024-02-17 DIAGNOSIS — N179 Acute kidney failure, unspecified: Secondary | ICD-10-CM | POA: Diagnosis not present

## 2024-02-17 DIAGNOSIS — Z90710 Acquired absence of both cervix and uterus: Secondary | ICD-10-CM | POA: Diagnosis not present

## 2024-02-17 DIAGNOSIS — M109 Gout, unspecified: Secondary | ICD-10-CM | POA: Diagnosis not present

## 2024-02-17 DIAGNOSIS — K219 Gastro-esophageal reflux disease without esophagitis: Secondary | ICD-10-CM | POA: Diagnosis not present

## 2024-02-17 DIAGNOSIS — E785 Hyperlipidemia, unspecified: Secondary | ICD-10-CM | POA: Diagnosis not present

## 2024-02-17 DIAGNOSIS — I129 Hypertensive chronic kidney disease with stage 1 through stage 4 chronic kidney disease, or unspecified chronic kidney disease: Secondary | ICD-10-CM | POA: Diagnosis not present

## 2024-02-17 DIAGNOSIS — I69354 Hemiplegia and hemiparesis following cerebral infarction affecting left non-dominant side: Secondary | ICD-10-CM | POA: Diagnosis not present

## 2024-02-17 DIAGNOSIS — N183 Chronic kidney disease, stage 3 unspecified: Secondary | ICD-10-CM | POA: Diagnosis not present

## 2024-02-17 DIAGNOSIS — R197 Diarrhea, unspecified: Secondary | ICD-10-CM | POA: Diagnosis not present

## 2024-02-17 DIAGNOSIS — Z7902 Long term (current) use of antithrombotics/antiplatelets: Secondary | ICD-10-CM | POA: Diagnosis not present

## 2024-02-17 DIAGNOSIS — Z9181 History of falling: Secondary | ICD-10-CM | POA: Diagnosis not present

## 2024-02-18 ENCOUNTER — Other Ambulatory Visit: Payer: Self-pay

## 2024-02-18 DIAGNOSIS — I872 Venous insufficiency (chronic) (peripheral): Secondary | ICD-10-CM

## 2024-02-20 DIAGNOSIS — I890 Lymphedema, not elsewhere classified: Secondary | ICD-10-CM | POA: Diagnosis not present

## 2024-02-20 DIAGNOSIS — N183 Chronic kidney disease, stage 3 unspecified: Secondary | ICD-10-CM | POA: Diagnosis not present

## 2024-02-20 DIAGNOSIS — E1136 Type 2 diabetes mellitus with diabetic cataract: Secondary | ICD-10-CM | POA: Diagnosis not present

## 2024-02-20 DIAGNOSIS — H6123 Impacted cerumen, bilateral: Secondary | ICD-10-CM | POA: Diagnosis not present

## 2024-02-20 DIAGNOSIS — E1122 Type 2 diabetes mellitus with diabetic chronic kidney disease: Secondary | ICD-10-CM | POA: Diagnosis not present

## 2024-02-20 DIAGNOSIS — M109 Gout, unspecified: Secondary | ICD-10-CM | POA: Diagnosis not present

## 2024-02-20 DIAGNOSIS — R197 Diarrhea, unspecified: Secondary | ICD-10-CM | POA: Diagnosis not present

## 2024-02-20 DIAGNOSIS — Z9181 History of falling: Secondary | ICD-10-CM | POA: Diagnosis not present

## 2024-02-20 DIAGNOSIS — Z90710 Acquired absence of both cervix and uterus: Secondary | ICD-10-CM | POA: Diagnosis not present

## 2024-02-20 DIAGNOSIS — N179 Acute kidney failure, unspecified: Secondary | ICD-10-CM | POA: Diagnosis not present

## 2024-02-20 DIAGNOSIS — D631 Anemia in chronic kidney disease: Secondary | ICD-10-CM | POA: Diagnosis not present

## 2024-02-20 DIAGNOSIS — Z7902 Long term (current) use of antithrombotics/antiplatelets: Secondary | ICD-10-CM | POA: Diagnosis not present

## 2024-02-20 DIAGNOSIS — E785 Hyperlipidemia, unspecified: Secondary | ICD-10-CM | POA: Diagnosis not present

## 2024-02-20 DIAGNOSIS — Z7984 Long term (current) use of oral hypoglycemic drugs: Secondary | ICD-10-CM | POA: Diagnosis not present

## 2024-02-20 DIAGNOSIS — I69354 Hemiplegia and hemiparesis following cerebral infarction affecting left non-dominant side: Secondary | ICD-10-CM | POA: Diagnosis not present

## 2024-02-20 DIAGNOSIS — K219 Gastro-esophageal reflux disease without esophagitis: Secondary | ICD-10-CM | POA: Diagnosis not present

## 2024-02-20 DIAGNOSIS — I129 Hypertensive chronic kidney disease with stage 1 through stage 4 chronic kidney disease, or unspecified chronic kidney disease: Secondary | ICD-10-CM | POA: Diagnosis not present

## 2024-02-25 DIAGNOSIS — D631 Anemia in chronic kidney disease: Secondary | ICD-10-CM | POA: Diagnosis not present

## 2024-02-25 DIAGNOSIS — M109 Gout, unspecified: Secondary | ICD-10-CM | POA: Diagnosis not present

## 2024-02-25 DIAGNOSIS — N179 Acute kidney failure, unspecified: Secondary | ICD-10-CM | POA: Diagnosis not present

## 2024-02-25 DIAGNOSIS — Z7984 Long term (current) use of oral hypoglycemic drugs: Secondary | ICD-10-CM | POA: Diagnosis not present

## 2024-02-25 DIAGNOSIS — R197 Diarrhea, unspecified: Secondary | ICD-10-CM | POA: Diagnosis not present

## 2024-02-25 DIAGNOSIS — Z90710 Acquired absence of both cervix and uterus: Secondary | ICD-10-CM | POA: Diagnosis not present

## 2024-02-25 DIAGNOSIS — I890 Lymphedema, not elsewhere classified: Secondary | ICD-10-CM | POA: Diagnosis not present

## 2024-02-25 DIAGNOSIS — E1136 Type 2 diabetes mellitus with diabetic cataract: Secondary | ICD-10-CM | POA: Diagnosis not present

## 2024-02-25 DIAGNOSIS — N183 Chronic kidney disease, stage 3 unspecified: Secondary | ICD-10-CM | POA: Diagnosis not present

## 2024-02-25 DIAGNOSIS — E1122 Type 2 diabetes mellitus with diabetic chronic kidney disease: Secondary | ICD-10-CM | POA: Diagnosis not present

## 2024-02-25 DIAGNOSIS — I129 Hypertensive chronic kidney disease with stage 1 through stage 4 chronic kidney disease, or unspecified chronic kidney disease: Secondary | ICD-10-CM | POA: Diagnosis not present

## 2024-02-25 DIAGNOSIS — H6123 Impacted cerumen, bilateral: Secondary | ICD-10-CM | POA: Diagnosis not present

## 2024-02-25 DIAGNOSIS — I69354 Hemiplegia and hemiparesis following cerebral infarction affecting left non-dominant side: Secondary | ICD-10-CM | POA: Diagnosis not present

## 2024-02-25 DIAGNOSIS — E785 Hyperlipidemia, unspecified: Secondary | ICD-10-CM | POA: Diagnosis not present

## 2024-02-25 DIAGNOSIS — Z9181 History of falling: Secondary | ICD-10-CM | POA: Diagnosis not present

## 2024-02-25 DIAGNOSIS — Z7902 Long term (current) use of antithrombotics/antiplatelets: Secondary | ICD-10-CM | POA: Diagnosis not present

## 2024-02-25 DIAGNOSIS — K219 Gastro-esophageal reflux disease without esophagitis: Secondary | ICD-10-CM | POA: Diagnosis not present

## 2024-02-29 DIAGNOSIS — M6281 Muscle weakness (generalized): Secondary | ICD-10-CM | POA: Diagnosis not present

## 2024-02-29 DIAGNOSIS — E119 Type 2 diabetes mellitus without complications: Secondary | ICD-10-CM | POA: Diagnosis not present

## 2024-02-29 DIAGNOSIS — N182 Chronic kidney disease, stage 2 (mild): Secondary | ICD-10-CM | POA: Diagnosis not present

## 2024-02-29 DIAGNOSIS — I69354 Hemiplegia and hemiparesis following cerebral infarction affecting left non-dominant side: Secondary | ICD-10-CM | POA: Diagnosis not present

## 2024-03-03 DIAGNOSIS — E1122 Type 2 diabetes mellitus with diabetic chronic kidney disease: Secondary | ICD-10-CM | POA: Diagnosis not present

## 2024-03-03 DIAGNOSIS — Z9181 History of falling: Secondary | ICD-10-CM | POA: Diagnosis not present

## 2024-03-03 DIAGNOSIS — Z90710 Acquired absence of both cervix and uterus: Secondary | ICD-10-CM | POA: Diagnosis not present

## 2024-03-03 DIAGNOSIS — N183 Chronic kidney disease, stage 3 unspecified: Secondary | ICD-10-CM | POA: Diagnosis not present

## 2024-03-03 DIAGNOSIS — Z7984 Long term (current) use of oral hypoglycemic drugs: Secondary | ICD-10-CM | POA: Diagnosis not present

## 2024-03-03 DIAGNOSIS — M109 Gout, unspecified: Secondary | ICD-10-CM | POA: Diagnosis not present

## 2024-03-03 DIAGNOSIS — E119 Type 2 diabetes mellitus without complications: Secondary | ICD-10-CM | POA: Diagnosis not present

## 2024-03-03 DIAGNOSIS — K219 Gastro-esophageal reflux disease without esophagitis: Secondary | ICD-10-CM | POA: Diagnosis not present

## 2024-03-03 DIAGNOSIS — I129 Hypertensive chronic kidney disease with stage 1 through stage 4 chronic kidney disease, or unspecified chronic kidney disease: Secondary | ICD-10-CM | POA: Diagnosis not present

## 2024-03-03 DIAGNOSIS — Z7902 Long term (current) use of antithrombotics/antiplatelets: Secondary | ICD-10-CM | POA: Diagnosis not present

## 2024-03-03 DIAGNOSIS — H6123 Impacted cerumen, bilateral: Secondary | ICD-10-CM | POA: Diagnosis not present

## 2024-03-03 DIAGNOSIS — I890 Lymphedema, not elsewhere classified: Secondary | ICD-10-CM | POA: Diagnosis not present

## 2024-03-03 DIAGNOSIS — E1136 Type 2 diabetes mellitus with diabetic cataract: Secondary | ICD-10-CM | POA: Diagnosis not present

## 2024-03-03 DIAGNOSIS — D631 Anemia in chronic kidney disease: Secondary | ICD-10-CM | POA: Diagnosis not present

## 2024-03-03 DIAGNOSIS — N182 Chronic kidney disease, stage 2 (mild): Secondary | ICD-10-CM | POA: Diagnosis not present

## 2024-03-03 DIAGNOSIS — E785 Hyperlipidemia, unspecified: Secondary | ICD-10-CM | POA: Diagnosis not present

## 2024-03-03 DIAGNOSIS — M6281 Muscle weakness (generalized): Secondary | ICD-10-CM | POA: Diagnosis not present

## 2024-03-03 DIAGNOSIS — N179 Acute kidney failure, unspecified: Secondary | ICD-10-CM | POA: Diagnosis not present

## 2024-03-03 DIAGNOSIS — R197 Diarrhea, unspecified: Secondary | ICD-10-CM | POA: Diagnosis not present

## 2024-03-03 DIAGNOSIS — I69354 Hemiplegia and hemiparesis following cerebral infarction affecting left non-dominant side: Secondary | ICD-10-CM | POA: Diagnosis not present

## 2024-03-04 DIAGNOSIS — N1831 Chronic kidney disease, stage 3a: Secondary | ICD-10-CM | POA: Diagnosis not present

## 2024-03-04 DIAGNOSIS — E1122 Type 2 diabetes mellitus with diabetic chronic kidney disease: Secondary | ICD-10-CM | POA: Diagnosis not present

## 2024-03-04 DIAGNOSIS — I10 Essential (primary) hypertension: Secondary | ICD-10-CM | POA: Diagnosis not present

## 2024-03-04 DIAGNOSIS — E1140 Type 2 diabetes mellitus with diabetic neuropathy, unspecified: Secondary | ICD-10-CM | POA: Diagnosis not present

## 2024-03-10 DIAGNOSIS — D631 Anemia in chronic kidney disease: Secondary | ICD-10-CM | POA: Diagnosis not present

## 2024-03-10 DIAGNOSIS — Z7902 Long term (current) use of antithrombotics/antiplatelets: Secondary | ICD-10-CM | POA: Diagnosis not present

## 2024-03-10 DIAGNOSIS — Z7984 Long term (current) use of oral hypoglycemic drugs: Secondary | ICD-10-CM | POA: Diagnosis not present

## 2024-03-10 DIAGNOSIS — I69354 Hemiplegia and hemiparesis following cerebral infarction affecting left non-dominant side: Secondary | ICD-10-CM | POA: Diagnosis not present

## 2024-03-10 DIAGNOSIS — Z90710 Acquired absence of both cervix and uterus: Secondary | ICD-10-CM | POA: Diagnosis not present

## 2024-03-10 DIAGNOSIS — Z9181 History of falling: Secondary | ICD-10-CM | POA: Diagnosis not present

## 2024-03-10 DIAGNOSIS — H6123 Impacted cerumen, bilateral: Secondary | ICD-10-CM | POA: Diagnosis not present

## 2024-03-10 DIAGNOSIS — E1136 Type 2 diabetes mellitus with diabetic cataract: Secondary | ICD-10-CM | POA: Diagnosis not present

## 2024-03-10 DIAGNOSIS — N179 Acute kidney failure, unspecified: Secondary | ICD-10-CM | POA: Diagnosis not present

## 2024-03-10 DIAGNOSIS — N183 Chronic kidney disease, stage 3 unspecified: Secondary | ICD-10-CM | POA: Diagnosis not present

## 2024-03-10 DIAGNOSIS — R197 Diarrhea, unspecified: Secondary | ICD-10-CM | POA: Diagnosis not present

## 2024-03-10 DIAGNOSIS — I129 Hypertensive chronic kidney disease with stage 1 through stage 4 chronic kidney disease, or unspecified chronic kidney disease: Secondary | ICD-10-CM | POA: Diagnosis not present

## 2024-03-10 DIAGNOSIS — E1122 Type 2 diabetes mellitus with diabetic chronic kidney disease: Secondary | ICD-10-CM | POA: Diagnosis not present

## 2024-03-10 DIAGNOSIS — E785 Hyperlipidemia, unspecified: Secondary | ICD-10-CM | POA: Diagnosis not present

## 2024-03-10 DIAGNOSIS — M109 Gout, unspecified: Secondary | ICD-10-CM | POA: Diagnosis not present

## 2024-03-10 DIAGNOSIS — I890 Lymphedema, not elsewhere classified: Secondary | ICD-10-CM | POA: Diagnosis not present

## 2024-03-10 DIAGNOSIS — K219 Gastro-esophageal reflux disease without esophagitis: Secondary | ICD-10-CM | POA: Diagnosis not present

## 2024-03-15 DIAGNOSIS — E1122 Type 2 diabetes mellitus with diabetic chronic kidney disease: Secondary | ICD-10-CM | POA: Diagnosis not present

## 2024-03-15 DIAGNOSIS — I10 Essential (primary) hypertension: Secondary | ICD-10-CM | POA: Diagnosis not present

## 2024-03-15 DIAGNOSIS — E1140 Type 2 diabetes mellitus with diabetic neuropathy, unspecified: Secondary | ICD-10-CM | POA: Diagnosis not present

## 2024-03-15 DIAGNOSIS — N1831 Chronic kidney disease, stage 3a: Secondary | ICD-10-CM | POA: Diagnosis not present

## 2024-03-18 ENCOUNTER — Other Ambulatory Visit: Payer: Self-pay

## 2024-03-18 ENCOUNTER — Emergency Department (HOSPITAL_COMMUNITY)

## 2024-03-18 ENCOUNTER — Emergency Department (HOSPITAL_COMMUNITY)
Admission: EM | Admit: 2024-03-18 | Discharge: 2024-03-18 | Disposition: A | Discharge status: Home / Self Care | Source: Home / Self Care

## 2024-03-18 DIAGNOSIS — K5730 Diverticulosis of large intestine without perforation or abscess without bleeding: Secondary | ICD-10-CM | POA: Diagnosis not present

## 2024-03-18 DIAGNOSIS — Z7982 Long term (current) use of aspirin: Secondary | ICD-10-CM | POA: Insufficient documentation

## 2024-03-18 DIAGNOSIS — M170 Bilateral primary osteoarthritis of knee: Secondary | ICD-10-CM | POA: Diagnosis not present

## 2024-03-18 DIAGNOSIS — R0602 Shortness of breath: Secondary | ICD-10-CM | POA: Diagnosis not present

## 2024-03-18 DIAGNOSIS — I10 Essential (primary) hypertension: Secondary | ICD-10-CM | POA: Insufficient documentation

## 2024-03-18 DIAGNOSIS — R1311 Dysphagia, oral phase: Secondary | ICD-10-CM | POA: Diagnosis not present

## 2024-03-18 DIAGNOSIS — E1165 Type 2 diabetes mellitus with hyperglycemia: Secondary | ICD-10-CM | POA: Diagnosis present

## 2024-03-18 DIAGNOSIS — N390 Urinary tract infection, site not specified: Secondary | ICD-10-CM | POA: Insufficient documentation

## 2024-03-18 DIAGNOSIS — N179 Acute kidney failure, unspecified: Secondary | ICD-10-CM | POA: Insufficient documentation

## 2024-03-18 DIAGNOSIS — Z7902 Long term (current) use of antithrombotics/antiplatelets: Secondary | ICD-10-CM | POA: Insufficient documentation

## 2024-03-18 DIAGNOSIS — Z6841 Body Mass Index (BMI) 40.0 and over, adult: Secondary | ICD-10-CM | POA: Diagnosis not present

## 2024-03-18 DIAGNOSIS — M810 Age-related osteoporosis without current pathological fracture: Secondary | ICD-10-CM | POA: Diagnosis not present

## 2024-03-18 DIAGNOSIS — I69391 Dysphagia following cerebral infarction: Secondary | ICD-10-CM | POA: Diagnosis not present

## 2024-03-18 DIAGNOSIS — N342 Other urethritis: Secondary | ICD-10-CM | POA: Diagnosis not present

## 2024-03-18 DIAGNOSIS — N3000 Acute cystitis without hematuria: Secondary | ICD-10-CM | POA: Diagnosis not present

## 2024-03-18 DIAGNOSIS — E876 Hypokalemia: Secondary | ICD-10-CM | POA: Insufficient documentation

## 2024-03-18 DIAGNOSIS — D638 Anemia in other chronic diseases classified elsewhere: Secondary | ICD-10-CM | POA: Diagnosis not present

## 2024-03-18 DIAGNOSIS — G459 Transient cerebral ischemic attack, unspecified: Secondary | ICD-10-CM | POA: Diagnosis not present

## 2024-03-18 DIAGNOSIS — I69392 Facial weakness following cerebral infarction: Secondary | ICD-10-CM | POA: Diagnosis not present

## 2024-03-18 DIAGNOSIS — R600 Localized edema: Secondary | ICD-10-CM | POA: Insufficient documentation

## 2024-03-18 DIAGNOSIS — Z6838 Body mass index (BMI) 38.0-38.9, adult: Secondary | ICD-10-CM | POA: Diagnosis not present

## 2024-03-18 DIAGNOSIS — M1A9XX0 Chronic gout, unspecified, without tophus (tophi): Secondary | ICD-10-CM | POA: Diagnosis not present

## 2024-03-18 DIAGNOSIS — E782 Mixed hyperlipidemia: Secondary | ICD-10-CM | POA: Diagnosis not present

## 2024-03-18 DIAGNOSIS — D539 Nutritional anemia, unspecified: Secondary | ICD-10-CM | POA: Diagnosis present

## 2024-03-18 DIAGNOSIS — Y92009 Unspecified place in unspecified non-institutional (private) residence as the place of occurrence of the external cause: Secondary | ICD-10-CM | POA: Diagnosis not present

## 2024-03-18 DIAGNOSIS — M6281 Muscle weakness (generalized): Secondary | ICD-10-CM | POA: Diagnosis not present

## 2024-03-18 DIAGNOSIS — R197 Diarrhea, unspecified: Secondary | ICD-10-CM | POA: Diagnosis not present

## 2024-03-18 DIAGNOSIS — I69354 Hemiplegia and hemiparesis following cerebral infarction affecting left non-dominant side: Secondary | ICD-10-CM | POA: Diagnosis not present

## 2024-03-18 DIAGNOSIS — R140 Abdominal distension (gaseous): Secondary | ICD-10-CM | POA: Diagnosis not present

## 2024-03-18 DIAGNOSIS — I129 Hypertensive chronic kidney disease with stage 1 through stage 4 chronic kidney disease, or unspecified chronic kidney disease: Secondary | ICD-10-CM | POA: Diagnosis present

## 2024-03-18 DIAGNOSIS — I890 Lymphedema, not elsewhere classified: Secondary | ICD-10-CM | POA: Diagnosis not present

## 2024-03-18 DIAGNOSIS — R109 Unspecified abdominal pain: Secondary | ICD-10-CM | POA: Diagnosis not present

## 2024-03-18 DIAGNOSIS — E119 Type 2 diabetes mellitus without complications: Secondary | ICD-10-CM | POA: Insufficient documentation

## 2024-03-18 DIAGNOSIS — E66813 Obesity, class 3: Secondary | ICD-10-CM | POA: Diagnosis present

## 2024-03-18 DIAGNOSIS — R5383 Other fatigue: Secondary | ICD-10-CM | POA: Diagnosis not present

## 2024-03-18 DIAGNOSIS — R41841 Cognitive communication deficit: Secondary | ICD-10-CM | POA: Diagnosis not present

## 2024-03-18 DIAGNOSIS — R29818 Other symptoms and signs involving the nervous system: Secondary | ICD-10-CM | POA: Diagnosis not present

## 2024-03-18 DIAGNOSIS — N281 Cyst of kidney, acquired: Secondary | ICD-10-CM | POA: Diagnosis not present

## 2024-03-18 DIAGNOSIS — D631 Anemia in chronic kidney disease: Secondary | ICD-10-CM | POA: Diagnosis present

## 2024-03-18 DIAGNOSIS — E11649 Type 2 diabetes mellitus with hypoglycemia without coma: Secondary | ICD-10-CM | POA: Diagnosis present

## 2024-03-18 DIAGNOSIS — K429 Umbilical hernia without obstruction or gangrene: Secondary | ICD-10-CM | POA: Diagnosis not present

## 2024-03-18 DIAGNOSIS — Z7984 Long term (current) use of oral hypoglycemic drugs: Secondary | ICD-10-CM | POA: Diagnosis not present

## 2024-03-18 DIAGNOSIS — K219 Gastro-esophageal reflux disease without esophagitis: Secondary | ICD-10-CM | POA: Diagnosis not present

## 2024-03-18 DIAGNOSIS — K5900 Constipation, unspecified: Secondary | ICD-10-CM | POA: Diagnosis present

## 2024-03-18 DIAGNOSIS — Z9049 Acquired absence of other specified parts of digestive tract: Secondary | ICD-10-CM | POA: Diagnosis not present

## 2024-03-18 DIAGNOSIS — Y9301 Activity, walking, marching and hiking: Secondary | ICD-10-CM | POA: Diagnosis present

## 2024-03-18 DIAGNOSIS — R296 Repeated falls: Secondary | ICD-10-CM | POA: Diagnosis not present

## 2024-03-18 DIAGNOSIS — W19XXXA Unspecified fall, initial encounter: Secondary | ICD-10-CM | POA: Diagnosis present

## 2024-03-18 DIAGNOSIS — E538 Deficiency of other specified B group vitamins: Secondary | ICD-10-CM | POA: Diagnosis present

## 2024-03-18 DIAGNOSIS — D509 Iron deficiency anemia, unspecified: Secondary | ICD-10-CM | POA: Diagnosis not present

## 2024-03-18 DIAGNOSIS — R531 Weakness: Secondary | ICD-10-CM | POA: Diagnosis not present

## 2024-03-18 DIAGNOSIS — M109 Gout, unspecified: Secondary | ICD-10-CM | POA: Diagnosis present

## 2024-03-18 DIAGNOSIS — E8809 Other disorders of plasma-protein metabolism, not elsewhere classified: Secondary | ICD-10-CM | POA: Diagnosis not present

## 2024-03-18 DIAGNOSIS — R9082 White matter disease, unspecified: Secondary | ICD-10-CM | POA: Diagnosis not present

## 2024-03-18 DIAGNOSIS — Z743 Need for continuous supervision: Secondary | ICD-10-CM | POA: Diagnosis not present

## 2024-03-18 DIAGNOSIS — R1032 Left lower quadrant pain: Secondary | ICD-10-CM | POA: Diagnosis not present

## 2024-03-18 DIAGNOSIS — E1140 Type 2 diabetes mellitus with diabetic neuropathy, unspecified: Secondary | ICD-10-CM | POA: Diagnosis not present

## 2024-03-18 DIAGNOSIS — E1122 Type 2 diabetes mellitus with diabetic chronic kidney disease: Secondary | ICD-10-CM | POA: Diagnosis not present

## 2024-03-18 DIAGNOSIS — N1832 Chronic kidney disease, stage 3b: Secondary | ICD-10-CM | POA: Diagnosis not present

## 2024-03-18 DIAGNOSIS — I639 Cerebral infarction, unspecified: Secondary | ICD-10-CM | POA: Diagnosis not present

## 2024-03-18 LAB — HEPATIC FUNCTION PANEL
ALT: 38 U/L (ref 0–44)
AST: 64 U/L — ABNORMAL HIGH (ref 15–41)
Albumin: 3.1 g/dL — ABNORMAL LOW (ref 3.5–5.0)
Alkaline Phosphatase: 107 U/L (ref 38–126)
Bilirubin, Direct: 0.3 mg/dL — ABNORMAL HIGH (ref 0.0–0.2)
Indirect Bilirubin: 0.6 mg/dL (ref 0.3–0.9)
Total Bilirubin: 0.9 mg/dL (ref 0.0–1.2)
Total Protein: 6.4 g/dL — ABNORMAL LOW (ref 6.5–8.1)

## 2024-03-18 LAB — I-STAT CHEM 8, ED
BUN: 22 mg/dL (ref 8–23)
Calcium, Ion: 0.95 mmol/L — ABNORMAL LOW (ref 1.15–1.40)
Chloride: 104 mmol/L (ref 98–111)
Creatinine, Ser: 2.6 mg/dL — ABNORMAL HIGH (ref 0.44–1.00)
Glucose, Bld: 70 mg/dL (ref 70–99)
HCT: 32 % — ABNORMAL LOW (ref 36.0–46.0)
Hemoglobin: 10.9 g/dL — ABNORMAL LOW (ref 12.0–15.0)
Potassium: 3.1 mmol/L — ABNORMAL LOW (ref 3.5–5.1)
Sodium: 139 mmol/L (ref 135–145)
TCO2: 21 mmol/L — ABNORMAL LOW (ref 22–32)

## 2024-03-18 LAB — CBC WITH DIFFERENTIAL/PLATELET
Abs Immature Granulocytes: 0.03 K/uL (ref 0.00–0.07)
Basophils Absolute: 0 K/uL (ref 0.0–0.1)
Basophils Relative: 0 %
Eosinophils Absolute: 0.1 K/uL (ref 0.0–0.5)
Eosinophils Relative: 2 %
HCT: 33.1 % — ABNORMAL LOW (ref 36.0–46.0)
Hemoglobin: 10.7 g/dL — ABNORMAL LOW (ref 12.0–15.0)
Immature Granulocytes: 1 %
Lymphocytes Relative: 16 %
Lymphs Abs: 1 K/uL (ref 0.7–4.0)
MCH: 25.5 pg — ABNORMAL LOW (ref 26.0–34.0)
MCHC: 32.3 g/dL (ref 30.0–36.0)
MCV: 79 fL — ABNORMAL LOW (ref 80.0–100.0)
Monocytes Absolute: 0.6 K/uL (ref 0.1–1.0)
Monocytes Relative: 10 %
Neutro Abs: 4.2 K/uL (ref 1.7–7.7)
Neutrophils Relative %: 71 %
Platelets: 171 K/uL (ref 150–400)
RBC: 4.19 MIL/uL (ref 3.87–5.11)
RDW: 21.5 % — ABNORMAL HIGH (ref 11.5–15.5)
WBC: 6 K/uL (ref 4.0–10.5)
nRBC: 0.5 % — ABNORMAL HIGH (ref 0.0–0.2)

## 2024-03-18 LAB — URINALYSIS, ROUTINE W REFLEX MICROSCOPIC
Bilirubin Urine: NEGATIVE
Glucose, UA: NEGATIVE mg/dL
Ketones, ur: NEGATIVE mg/dL
Nitrite: NEGATIVE
Protein, ur: 30 mg/dL — AB
Specific Gravity, Urine: 1.011 (ref 1.005–1.030)
WBC, UA: >50 WBC/hpf (ref 0–5)
pH: 5 (ref 5.0–8.0)

## 2024-03-18 LAB — LIPASE, BLOOD: Lipase: 21 U/L (ref 11–51)

## 2024-03-18 LAB — I-STAT CG4 LACTIC ACID, ED: Lactic Acid, Venous: 1.7 mmol/L (ref 0.5–1.9)

## 2024-03-18 MED ORDER — SULFAMETHOXAZOLE-TRIMETHOPRIM 800-160 MG PO TABS: 1.0000 | ORAL_TABLET | Freq: Two times a day (BID) | ORAL | 0 refills | Status: DC

## 2024-03-18 MED ORDER — SODIUM CHLORIDE 0.9 % IV BOLUS
500.0000 mL | Freq: Once | INTRAVENOUS | Status: AC
  Administered 2024-03-18: 500 mL via INTRAVENOUS

## 2024-03-18 MED ORDER — SULFAMETHOXAZOLE-TRIMETHOPRIM 800-160 MG PO TABS
1.0000 | ORAL_TABLET | Freq: Once | ORAL | Status: AC
  Administered 2024-03-18: 1 via ORAL
  Filled 2024-03-18: qty 1

## 2024-03-18 MED ORDER — POTASSIUM CHLORIDE CRYS ER 20 MEQ PO TBCR
40.0000 meq | EXTENDED_RELEASE_TABLET | Freq: Once | ORAL | Status: AC
  Administered 2024-03-18: 40 meq via ORAL
  Filled 2024-03-18: qty 2

## 2024-03-18 NOTE — Discharge Instructions (Addendum)
 Take antibiotics as prescribed for your UTI.  You can pick up Imodium  over-the-counter at the pharmacy and take it as needed for diarrhea.  Make sure you are drinking lots of fluids and your other medication as prescribed.  Call your primary care doctor tomorrow to make a follow-up appointment for next week.  Return to the ER for any new or worsening symptoms.

## 2024-03-18 NOTE — ED Notes (Signed)
 Unable to obtain labs x3.

## 2024-03-18 NOTE — ED Notes (Signed)
 CCMD called.

## 2024-03-18 NOTE — ED Notes (Signed)
 Ptar called pt is 4 on the list

## 2024-03-18 NOTE — ED Provider Notes (Signed)
 [place] EMERGENCY DEPARTMENT AT [facility] Provider Note   CSN: [id number] Arrival date & time: 03/18/24  1108     Patient presents with: Weakness   Laurie Hunter is a 78 y.o. female.   78 year old female presents for evaluation of weakness for 2 days.  States she has had decreased appetite and some diarrhea.  She states she has had a decreased appetite as well as fatigue and had difficulty walking lifting her legs due to weakness.  Denies any vomiting.  Denies any other symptoms or concerns.        Prior to Admission medications   Medication Sig Start Date End Date Taking? Authorizing Provider  sulfamethoxazole -trimethoprim  (BACTRIM  DS) 800-160 MG tablet Take 1 tablet by mouth 2 (two) times daily for 7 days. 03/18/24 03/25/24 Yes Khamiyah Grefe L, DO  acetaminophen  (TYLENOL ) 500 MG tablet Take 500-1,000 mg by mouth every 6 (six) hours as needed for mild pain (pain score 1-3) or moderate pain (pain score 4-6).    [provider]  allopurinol  (ZYLOPRIM ) 100 MG tablet Take 100 mg by mouth in the morning.    [provider]  amLODipine  (NORVASC ) 10 MG tablet Take 10 mg by mouth at bedtime.    [provider]  aspirin  81 MG tablet Take 81 mg by mouth in the morning.    [provider]  atenolol (TENORMIN) 50 MG tablet Take 50 mg by mouth in the morning.    [provider]  Bismuth  Subsalicylate (KAOPECTATE PO) Take 1-2 tablets by mouth as needed.    [provider]  bismuth  subsalicylate (PEPTO BISMOL) 262 MG chewable tablet Chew 524 mg by mouth as needed for indigestion or diarrhea or loose stools.    [provider]  carbamide peroxide (DEBROX) 6.5 % OTIC solution Place 5 drops into the right ear 2 (two) times daily. 11/01/23   Cindy Garnette POUR, MD  clopidogrel  (PLAVIX ) 75 MG tablet Take 75 mg by mouth in the morning.    [provider]  colchicine  0.6 MG tablet Take 0.6 mg by mouth 2 (two) times daily.     [provider]  diphenhydrAMINE  (BENADRYL ) 25 MG tablet Take 50 mg by mouth at bedtime. 10/17/12   [provider]  famotidine  (PEPCID ) 20 MG tablet Take 1 tablet (20 mg total) by mouth 2 (two) times daily. 10/17/12   Cleotilde Rogue, MD  FEROSUL 325 (65 Fe) MG tablet Take 325 mg by mouth in the morning. 03/08/20   [provider]  Loperamide  HCl (IMODIUM  PO) Take 1 tablet by mouth at bedtime as needed.    [provider]  melatonin 5 MG TABS Take 5 mg by mouth at bedtime as needed.    [provider]  metFORMIN (GLUCOPHAGE) 500 MG tablet Take 500 mg by mouth in the morning.    [provider]    Allergies: Erythromycin, Lisinopril, and Penicillins    Review of Systems  Constitutional:  Positive for fatigue. Negative for chills and fever.  HENT:  Negative for ear pain and sore throat.   Eyes:  Negative for pain and visual disturbance.  Respiratory:  Negative for cough and shortness of breath.   Cardiovascular:  Negative for chest pain and palpitations.  Gastrointestinal:  Positive for diarrhea. Negative for abdominal pain and vomiting.  Genitourinary:  Negative for dysuria and hematuria.  Musculoskeletal:  Negative for arthralgias and back pain.  Skin:  Negative for color change and rash.  Neurological:  Positive for weakness. Negative for seizures and syncope.  All other systems reviewed and are negative.   Updated Vital Signs BP (!) 143/75 (BP Location: Right Arm)   Pulse 71   Temp 98 F (36.7 C) (Oral)   Resp 19   SpO2 100%   Physical Exam Vitals and nursing note reviewed.  Constitutional:      General: She is not in acute distress.    Appearance: Normal appearance. She is well-developed. She is not ill-appearing.  HENT:     Head: Normocephalic and atraumatic.  Eyes:     Conjunctiva/sclera: Conjunctivae normal.  Cardiovascular:     Rate and Rhythm: Normal rate and regular rhythm.     Heart sounds: No murmur  heard. Pulmonary:     Effort: Pulmonary effort is normal. No respiratory distress.     Breath sounds: Normal breath sounds.  Abdominal:     Palpations: Abdomen is soft.     Tenderness: There is no abdominal tenderness.  Musculoskeletal:        General: No swelling.     Cervical back: Neck supple.     Right lower leg: Edema present.     Left lower leg: Edema present.  Skin:    General: Skin is warm and dry.     Capillary Refill: Capillary refill takes less than 2 seconds.  Neurological:     General: No focal deficit present.     Mental Status: She is alert.  Psychiatric:        Mood and Affect: Mood normal.     (all labs ordered are listed, but only abnormal results are displayed) Labs Reviewed  CBC WITH DIFFERENTIAL/PLATELET - Abnormal; Notable for the following components:      Result Value   Hemoglobin 10.7 (*)    HCT 33.1 (*)    MCV 79.0 (*)    MCH 25.5 (*)    RDW 21.5 (*)    nRBC 0.5 (*)    All other components within normal limits  HEPATIC FUNCTION PANEL - Abnormal; Notable for the following components:   Total Protein 6.4 (*)    Albumin 3.1 (*)    AST 64 (*)    Bilirubin, Direct 0.3 (*)    All other components within normal limits  URINALYSIS, ROUTINE W REFLEX MICROSCOPIC - Abnormal; Notable for the following components:   APPearance CLOUDY (*)    Hgb urine dipstick SMALL (*)    Protein, ur 30 (*)    Leukocytes,Ua LARGE (*)    Bacteria, UA MANY (*)    All other components within normal limits  I-STAT CHEM 8, ED - Abnormal; Notable for the following components:   Potassium 3.1 (*)    Creatinine, Ser 2.60 (*)    Calcium , Ion 0.95 (*)    TCO2 21 (*)    Hemoglobin 10.9 (*)    HCT 32.0 (*)    All other components within normal limits  LIPASE, BLOOD  I-STAT CG4 LACTIC ACID, ED    EKG: EKG Interpretation Date/Time:  Wednesday March 18 2024 11:24:25 EDT Ventricular Rate:  64 PR Interval:  145 QRS Duration:  100 QT Interval:  443 QTC  Calculation: 458 R Axis:   49  Text Interpretation: Sinus rhythm Nonspecific T abnormalities, diffuse leads Compared with previous EKG from 10/28/2023 Confirmed by Gennaro Bouchard ([id number]) on 03/18/2024 12:03:27 PM  Radiology: CT ABDOMEN PELVIS WO CONTRAST Result Date: 03/18/2024 CLINICAL DATA:  Left lower quadrant abdominal pain. EXAM: CT ABDOMEN AND PELVIS WITHOUT CONTRAST  TECHNIQUE: Multidetector CT imaging of the abdomen and pelvis was performed following the standard protocol without IV contrast. RADIATION DOSE REDUCTION: This exam was performed according to the departmental dose-optimization program which includes automated exposure control, adjustment of the mA and/or kV according to patient size and/or use of iterative reconstruction technique. COMPARISON:  None Available. FINDINGS: Lower chest: No acute abnormality. Hepatobiliary: No suspicious focal hepatic lesion identified within the limits of an unenhanced exam. Status post cholecystectomy. No biliary dilatation. Pancreas: Unremarkable Spleen: Unremarkable Adrenals/Urinary Tract: Adrenal glands are unremarkable. 1.8 cm left renal cyst, for which no follow-up imaging is recommended. No renal or ureteral calculi. No hydronephrosis. Bladder is unremarkable. Stomach/Bowel: The stomach and small bowel are grossly unremarkable. No obstruction. Appendix is not clearly identified. No pericecal inflammatory changes. Air-fluid levels throughout the colon, which can be seen in the setting of diarrheal illness. Vascular/Lymphatic: Aortic atherosclerosis. Nonaneurysmal abdominal aorta. No enlarged abdominal or pelvic lymph nodes. Reproductive: Status post hysterectomy. No adnexal masses. Other: Fat containing umbilical hernia with the hernia neck measuring approximately 3.8 cm transverse. Musculoskeletal: No acute osseous abnormality. No suspicious osseous lesion. Degenerative changes of the thoracolumbar spine. IMPRESSION: 1. Air-fluid levels throughout the  colon, which can be seen in the setting of diarrheal illness. 2. Otherwise, no acute localizing findings in the abdomen or pelvis. 3. Fat containing umbilical hernia. 4.  Aortic Atherosclerosis (ICD10-I70.0). Electronically Signed   By: Harrietta Sherry M.D.   On: 03/18/2024 12:16     Procedures   Medications Ordered in the ED  potassium chloride  SA (KLOR-CON  M) CR tablet 40 mEq (has no administration in time range)  sulfamethoxazole -trimethoprim  (BACTRIM  DS) 800-160 MG per tablet 1 tablet (has no administration in time range)  sodium chloride  0.9 % bolus 500 mL (0 mLs Intravenous Stopped 03/18/24 1357)  sodium chloride  0.9 % bolus 500 mL (0 mLs Intravenous Stopped 03/18/24 1357)                                    Medical Decision Making Cardiac monitor interpretation: Sinus rhythm, no ectopy  Patient here for generalized weakness.  Vitals are stable.  CT scan shows diarrheal state but otherwise no acute abnormality.  She does have a mild AKI and some hypokalemia.  Was given IV fluids and potassium was replaced.  She is also found to have a urinary tract infection.  I will start her on Bactrim  as she is allergic to penicillins.  Family is at bedside and they will help patient get home.  I will give her prescription for Bactrim  she is advised to increase her fluid intake and obtain close follow-up with her primary care doctor.  Advised return to the ER for any new or worsening symptoms.  Patient and family at bedside feel comfortable with the plan for discharge.  Problems Addressed: AKI (acute kidney injury) (HCC): acute illness or injury Diarrhea, unspecified type: acute illness or injury Hypokalemia: acute illness or injury Urinary tract infection without hematuria, site unspecified: acute illness or injury  Amount and/or Complexity of Data Reviewed External Data Reviewed: notes.    Details: Prior outpatient records reviewed and patient follows up in the office regularly for diabetes Labs:  ordered. Decision-making details documented in ED Course.    Details: Labs ordered and reviewed by me and patient with mild AKI, hypokalemia and UTI Radiology: ordered and independent interpretation performed. Decision-making details documented in ED Course.  Details: CT abdomen pelvis ordered and reviewed by me and shows diarrheal state but no other acute abnormality ECG/medicine tests: ordered and independent interpretation performed. Decision-making details documented in ED Course.    Details: Ordered and interpreted by me in the absence of cardiology and shows sinus rhythm, no STEMI or acute change when compared to prior EKG  Risk OTC drugs. Prescription drug management. Drug therapy requiring intensive monitoring for toxicity.     Final diagnoses:  AKI (acute kidney injury) (HCC)  Hypokalemia  Urinary tract infection without hematuria, site unspecified  Diarrhea, unspecified type    ED Discharge Orders          Ordered    sulfamethoxazole -trimethoprim  (BACTRIM  DS) 800-160 MG tablet  2 times daily        03/18/24 1511               Mikyle Sox L, DO 03/18/24 1530

## 2024-03-18 NOTE — ED Triage Notes (Addendum)
 BIB [facility] from home for weakness past 48 hr. Stroke previous 2013 deficits L side. Hx Hypertension Diabetic. No meds today. Patient reports difficulty swallowing  and chewing in the past few days CBG 99 128/82  22 R AC

## 2024-03-18 NOTE — ED Notes (Signed)
 [facility] [phone]

## 2024-03-19 ENCOUNTER — Ambulatory Visit (HOSPITAL_COMMUNITY): Admission: RE | Admit: 2024-03-19 | Source: Ambulatory Visit

## 2024-03-19 ENCOUNTER — Inpatient Hospital Stay (HOSPITAL_COMMUNITY)
Admission: EM | Admit: 2024-03-19 | Discharge: 2024-03-27 | DRG: 690 | Disposition: A | Discharge status: Skilled Nursing Facility | Attending: Internal Medicine | Admitting: Internal Medicine

## 2024-03-19 ENCOUNTER — Emergency Department (HOSPITAL_COMMUNITY)

## 2024-03-19 ENCOUNTER — Ambulatory Visit

## 2024-03-19 ENCOUNTER — Other Ambulatory Visit: Payer: Self-pay

## 2024-03-19 DIAGNOSIS — E1122 Type 2 diabetes mellitus with diabetic chronic kidney disease: Secondary | ICD-10-CM | POA: Diagnosis present

## 2024-03-19 DIAGNOSIS — Z9181 History of falling: Secondary | ICD-10-CM

## 2024-03-19 DIAGNOSIS — N390 Urinary tract infection, site not specified: Principal | ICD-10-CM | POA: Diagnosis present

## 2024-03-19 DIAGNOSIS — Z7902 Long term (current) use of antithrombotics/antiplatelets: Secondary | ICD-10-CM

## 2024-03-19 DIAGNOSIS — R7989 Other specified abnormal findings of blood chemistry: Secondary | ICD-10-CM | POA: Diagnosis present

## 2024-03-19 DIAGNOSIS — N179 Acute kidney failure, unspecified: Secondary | ICD-10-CM | POA: Diagnosis present

## 2024-03-19 DIAGNOSIS — R531 Weakness: Secondary | ICD-10-CM | POA: Diagnosis present

## 2024-03-19 DIAGNOSIS — E1165 Type 2 diabetes mellitus with hyperglycemia: Secondary | ICD-10-CM | POA: Diagnosis present

## 2024-03-19 DIAGNOSIS — W19XXXA Unspecified fall, initial encounter: Secondary | ICD-10-CM | POA: Diagnosis present

## 2024-03-19 DIAGNOSIS — R296 Repeated falls: Secondary | ICD-10-CM | POA: Diagnosis present

## 2024-03-19 DIAGNOSIS — E876 Hypokalemia: Secondary | ICD-10-CM | POA: Diagnosis present

## 2024-03-19 DIAGNOSIS — K5900 Constipation, unspecified: Secondary | ICD-10-CM | POA: Diagnosis present

## 2024-03-19 DIAGNOSIS — E11649 Type 2 diabetes mellitus with hypoglycemia without coma: Secondary | ICD-10-CM | POA: Diagnosis present

## 2024-03-19 DIAGNOSIS — I69392 Facial weakness following cerebral infarction: Secondary | ICD-10-CM

## 2024-03-19 DIAGNOSIS — Y92009 Unspecified place in unspecified non-institutional (private) residence as the place of occurrence of the external cause: Secondary | ICD-10-CM

## 2024-03-19 DIAGNOSIS — M109 Gout, unspecified: Secondary | ICD-10-CM | POA: Diagnosis present

## 2024-03-19 DIAGNOSIS — Y9301 Activity, walking, marching and hiking: Secondary | ICD-10-CM | POA: Diagnosis present

## 2024-03-19 DIAGNOSIS — I69391 Dysphagia following cerebral infarction: Secondary | ICD-10-CM

## 2024-03-19 DIAGNOSIS — E538 Deficiency of other specified B group vitamins: Secondary | ICD-10-CM | POA: Diagnosis present

## 2024-03-19 DIAGNOSIS — N189 Chronic kidney disease, unspecified: Secondary | ICD-10-CM | POA: Diagnosis present

## 2024-03-19 DIAGNOSIS — M170 Bilateral primary osteoarthritis of knee: Secondary | ICD-10-CM | POA: Diagnosis present

## 2024-03-19 DIAGNOSIS — D631 Anemia in chronic kidney disease: Secondary | ICD-10-CM | POA: Diagnosis present

## 2024-03-19 DIAGNOSIS — Z6841 Body Mass Index (BMI) 40.0 and over, adult: Secondary | ICD-10-CM

## 2024-03-19 DIAGNOSIS — N3000 Acute cystitis without hematuria: Secondary | ICD-10-CM | POA: Diagnosis not present

## 2024-03-19 DIAGNOSIS — N1832 Chronic kidney disease, stage 3b: Secondary | ICD-10-CM | POA: Diagnosis present

## 2024-03-19 DIAGNOSIS — Z79899 Other long term (current) drug therapy: Secondary | ICD-10-CM

## 2024-03-19 DIAGNOSIS — R1311 Dysphagia, oral phase: Secondary | ICD-10-CM | POA: Diagnosis present

## 2024-03-19 DIAGNOSIS — R29818 Other symptoms and signs involving the nervous system: Secondary | ICD-10-CM | POA: Diagnosis not present

## 2024-03-19 DIAGNOSIS — I129 Hypertensive chronic kidney disease with stage 1 through stage 4 chronic kidney disease, or unspecified chronic kidney disease: Secondary | ICD-10-CM | POA: Diagnosis present

## 2024-03-19 DIAGNOSIS — E66813 Obesity, class 3: Secondary | ICD-10-CM | POA: Diagnosis present

## 2024-03-19 DIAGNOSIS — Z90710 Acquired absence of both cervix and uterus: Secondary | ICD-10-CM

## 2024-03-19 DIAGNOSIS — I890 Lymphedema, not elsewhere classified: Secondary | ICD-10-CM | POA: Diagnosis present

## 2024-03-19 DIAGNOSIS — R9082 White matter disease, unspecified: Secondary | ICD-10-CM | POA: Diagnosis not present

## 2024-03-19 DIAGNOSIS — Z7982 Long term (current) use of aspirin: Secondary | ICD-10-CM

## 2024-03-19 DIAGNOSIS — Z7984 Long term (current) use of oral hypoglycemic drugs: Secondary | ICD-10-CM

## 2024-03-19 DIAGNOSIS — D539 Nutritional anemia, unspecified: Secondary | ICD-10-CM | POA: Diagnosis present

## 2024-03-19 DIAGNOSIS — E119 Type 2 diabetes mellitus without complications: Secondary | ICD-10-CM

## 2024-03-19 DIAGNOSIS — I10 Essential (primary) hypertension: Secondary | ICD-10-CM | POA: Diagnosis not present

## 2024-03-19 LAB — CBC WITH DIFFERENTIAL/PLATELET
Abs Immature Granulocytes: 0.04 K/uL (ref 0.00–0.07)
Basophils Absolute: 0 K/uL (ref 0.0–0.1)
Basophils Relative: 0 %
Eosinophils Absolute: 0.1 K/uL (ref 0.0–0.5)
Eosinophils Relative: 1 %
HCT: 35 % — ABNORMAL LOW (ref 36.0–46.0)
Hemoglobin: 11.5 g/dL — ABNORMAL LOW (ref 12.0–15.0)
Immature Granulocytes: 1 %
Lymphocytes Relative: 18 %
Lymphs Abs: 1.2 K/uL (ref 0.7–4.0)
MCH: 25.5 pg — ABNORMAL LOW (ref 26.0–34.0)
MCHC: 32.9 g/dL (ref 30.0–36.0)
MCV: 77.6 fL — ABNORMAL LOW (ref 80.0–100.0)
Monocytes Absolute: 0.7 K/uL (ref 0.1–1.0)
Monocytes Relative: 11 %
Neutro Abs: 4.5 K/uL (ref 1.7–7.7)
Neutrophils Relative %: 69 %
Platelets: 173 K/uL (ref 150–400)
RBC: 4.51 MIL/uL (ref 3.87–5.11)
RDW: 21.7 % — ABNORMAL HIGH (ref 11.5–15.5)
Smear Review: NORMAL
WBC: 6.5 K/uL (ref 4.0–10.5)
nRBC: 0.6 % — ABNORMAL HIGH (ref 0.0–0.2)

## 2024-03-19 LAB — BASIC METABOLIC PANEL WITH GFR
Anion gap: 13 (ref 5–15)
BUN: 18 mg/dL (ref 8–23)
CO2: 22 mmol/L (ref 22–32)
Calcium: 7.8 mg/dL — ABNORMAL LOW (ref 8.9–10.3)
Chloride: 100 mmol/L (ref 98–111)
Creatinine, Ser: 1.91 mg/dL — ABNORMAL HIGH (ref 0.44–1.00)
GFR, Estimated: 27 mL/min — ABNORMAL LOW (ref >60)
Glucose, Bld: 68 mg/dL — ABNORMAL LOW (ref 70–99)
Potassium: 3.1 mmol/L — ABNORMAL LOW (ref 3.5–5.1)
Sodium: 135 mmol/L (ref 135–145)

## 2024-03-19 NOTE — ED Notes (Signed)
 Blood draw attempt x1, minilab called for labs
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
## 2024-03-19 NOTE — ED Triage Notes (Signed)
 PT BIB [facility] from home, pt slipped and fell off her couch, no new injuries or aches and pains, has a brace on left leg from previous stroke. Pt not able to able to stand since stroke.    110/66 76HR 94%RA CBG 110

## 2024-03-19 NOTE — ED Provider Triage Note (Signed)
 Emergency Medicine Provider Triage Evaluation Note  Laurie Hunter , a 78 y.o. female  was evaluated in triage.  Pt complains of worsening left lower extremity weakness.  Prior stroke and was able to ambulate with brace and rollator but not able to now.  Review of Systems  Positive: Weakness Negative: Chest pain  Physical Exam  BP 136/66 (BP Location: Right Arm)   Pulse 63   Temp 98.4 F (36.9 C) (Oral)   Resp 18   SpO2 92%  Gen:   Awake, no distress   Resp:  Normal effort  MSK:   Left leg weakness Other:    Medical Decision Making  Medically screening exam initiated at 8:49 PM.  Appropriate orders placed.  Laurie Hunter was informed that the remainder of the evaluation will be completed by another provider, this initial triage assessment does not replace that evaluation, and the importance of remaining in the ED until their evaluation is complete.     Laurie Ozell BROCKS, MD 03/20/24 [phone]

## 2024-03-20 ENCOUNTER — Emergency Department (HOSPITAL_COMMUNITY)

## 2024-03-20 ENCOUNTER — Encounter (HOSPITAL_COMMUNITY): Payer: Self-pay | Admitting: Internal Medicine

## 2024-03-20 DIAGNOSIS — G459 Transient cerebral ischemic attack, unspecified: Secondary | ICD-10-CM | POA: Diagnosis not present

## 2024-03-20 DIAGNOSIS — E876 Hypokalemia: Secondary | ICD-10-CM

## 2024-03-20 DIAGNOSIS — N3000 Acute cystitis without hematuria: Secondary | ICD-10-CM

## 2024-03-20 DIAGNOSIS — R531 Weakness: Secondary | ICD-10-CM | POA: Diagnosis not present

## 2024-03-20 DIAGNOSIS — N390 Urinary tract infection, site not specified: Secondary | ICD-10-CM | POA: Diagnosis present

## 2024-03-20 LAB — URINALYSIS, ROUTINE W REFLEX MICROSCOPIC
Bilirubin Urine: NEGATIVE
Glucose, UA: NEGATIVE mg/dL
Ketones, ur: NEGATIVE mg/dL
Nitrite: NEGATIVE
Protein, ur: 30 mg/dL — AB
Specific Gravity, Urine: 1.01 (ref 1.005–1.030)
WBC, UA: >50 WBC/hpf (ref 0–5)
pH: 5 (ref 5.0–8.0)

## 2024-03-20 LAB — GLUCOSE, CAPILLARY
Glucose-Capillary: 89 mg/dL (ref 70–99)
Glucose-Capillary: 76 mg/dL (ref 70–99)
Glucose-Capillary: 114 mg/dL — ABNORMAL HIGH (ref 70–99)

## 2024-03-20 LAB — C DIFFICILE QUICK SCREEN W PCR REFLEX
C Diff antigen: NEGATIVE
C Diff interpretation: NOT DETECTED
C Diff toxin: NEGATIVE

## 2024-03-20 LAB — CBG MONITORING, ED
Glucose-Capillary: 111 mg/dL — ABNORMAL HIGH (ref 70–99)
Glucose-Capillary: 57 mg/dL — ABNORMAL LOW (ref 70–99)
Glucose-Capillary: 112 mg/dL — ABNORMAL HIGH (ref 70–99)

## 2024-03-20 MED ORDER — ONDANSETRON HCL 4 MG PO TABS
4.0000 mg | ORAL_TABLET | Freq: Four times a day (QID) | ORAL | Status: DC | PRN
  Administered 2024-03-26: 4 mg via ORAL
  Filled 2024-03-20: qty 1

## 2024-03-20 MED ORDER — ATORVASTATIN CALCIUM 10 MG PO TABS
20.0000 mg | ORAL_TABLET | Freq: Every day | ORAL | Status: DC
  Administered 2024-03-20 – 2024-03-26 (×7): 20 mg via ORAL
  Filled 2024-03-20 (×7): qty 2

## 2024-03-20 MED ORDER — LOPERAMIDE HCL 2 MG PO CAPS
2.0000 mg | ORAL_CAPSULE | ORAL | Status: DC | PRN
  Administered 2024-03-21 – 2024-03-23 (×2): 2 mg via ORAL
  Filled 2024-03-20 (×2): qty 1

## 2024-03-20 MED ORDER — CLOPIDOGREL BISULFATE 75 MG PO TABS
75.0000 mg | ORAL_TABLET | Freq: Every day | ORAL | Status: DC
  Administered 2024-03-20 – 2024-03-27 (×8): 75 mg via ORAL
  Filled 2024-03-20 (×8): qty 1

## 2024-03-20 MED ORDER — ACETAMINOPHEN 325 MG PO TABS
650.0000 mg | ORAL_TABLET | Freq: Four times a day (QID) | ORAL | Status: DC | PRN
  Administered 2024-03-20 – 2024-03-26 (×5): 650 mg via ORAL
  Filled 2024-03-20 (×6): qty 2

## 2024-03-20 MED ORDER — DEXTROSE-SODIUM CHLORIDE 5-0.45 % IV SOLN
INTRAVENOUS | Status: DC
Start: 2024-03-20 — End: 2024-03-20

## 2024-03-20 MED ORDER — POTASSIUM CHLORIDE CRYS ER 20 MEQ PO TBCR
40.0000 meq | EXTENDED_RELEASE_TABLET | Freq: Once | ORAL | Status: AC
  Administered 2024-03-20: 40 meq via ORAL
  Filled 2024-03-20: qty 2

## 2024-03-20 MED ORDER — SODIUM CHLORIDE 0.9 % IV SOLN
1.0000 g | INTRAVENOUS | Status: DC
  Administered 2024-03-21 – 2024-03-22 (×2): 1 g via INTRAVENOUS
  Filled 2024-03-20 (×2): qty 10

## 2024-03-20 MED ORDER — HEPARIN SODIUM (PORCINE) 5000 UNIT/ML IJ SOLN
5000.0000 [IU] | Freq: Three times a day (TID) | INTRAMUSCULAR | Status: DC
  Administered 2024-03-20 – 2024-03-22 (×6): 5000 [IU] via SUBCUTANEOUS
  Filled 2024-03-20 (×5): qty 1

## 2024-03-20 MED ORDER — INSULIN ASPART 100 UNIT/ML IJ SOLN: 0.0000 [IU] | Freq: Three times a day (TID) | INTRAMUSCULAR | Status: DC

## 2024-03-20 MED ORDER — COLCHICINE 0.6 MG PO TABS
0.6000 mg | ORAL_TABLET | Freq: Two times a day (BID) | ORAL | Status: DC
  Administered 2024-03-20 – 2024-03-23 (×7): 0.6 mg via ORAL
  Filled 2024-03-20 (×7): qty 1

## 2024-03-20 MED ORDER — ACETAMINOPHEN 650 MG RE SUPP: 650.0000 mg | Freq: Four times a day (QID) | RECTAL | Status: DC | PRN

## 2024-03-20 MED ORDER — ASPIRIN 81 MG PO TBEC
81.0000 mg | DELAYED_RELEASE_TABLET | Freq: Every day | ORAL | Status: DC
  Administered 2024-03-20 – 2024-03-27 (×8): 81 mg via ORAL
  Filled 2024-03-20 (×8): qty 1

## 2024-03-20 MED ORDER — SACCHAROMYCES BOULARDII 250 MG PO CAPS
250.0000 mg | ORAL_CAPSULE | Freq: Two times a day (BID) | ORAL | Status: DC
  Administered 2024-03-20 – 2024-03-27 (×15): 250 mg via ORAL
  Filled 2024-03-20 (×15): qty 1

## 2024-03-20 MED ORDER — ONDANSETRON HCL 4 MG/2ML IJ SOLN
4.0000 mg | Freq: Four times a day (QID) | INTRAMUSCULAR | Status: DC | PRN
  Administered 2024-03-23: 4 mg via INTRAVENOUS
  Filled 2024-03-20 (×2): qty 2

## 2024-03-20 MED ORDER — SODIUM CHLORIDE 0.9 % IV SOLN
1.0000 g | Freq: Once | INTRAVENOUS | Status: AC
  Administered 2024-03-20: 1 g via INTRAVENOUS
  Filled 2024-03-20: qty 10

## 2024-03-20 MED ORDER — ALLOPURINOL 100 MG PO TABS
100.0000 mg | ORAL_TABLET | Freq: Every day | ORAL | Status: DC
  Administered 2024-03-20 – 2024-03-27 (×8): 100 mg via ORAL
  Filled 2024-03-20 (×8): qty 1

## 2024-03-20 NOTE — ED Notes (Signed)
 Provider notified of cbg 66, pt provided with food and juice.

## 2024-03-20 NOTE — ED Notes (Signed)
 Attempted to assist patient to bedside commode with PT. Patient appeared unsteady and weak to stand. Helped pt get back into bed. Patient will use a bedpan if needed. She is aware of this matter.

## 2024-03-20 NOTE — Plan of Care (Signed)

## 2024-03-20 NOTE — Progress Notes (Signed)
 [facility] PLAN OF CARE NOTE Patient: Laurie Hunter FMW:[id number]   PCP: Ransom Other, MD DOB: [birth year]   DOA: 03/19/2024   DOS: 03/20/2024    Patient was admitted by my colleague earlier on 03/20/2024. I have reviewed the H&P as well as assessment and plan and agree with the same. Important changes in the plan are listed below.  Plan of care: Principal Problem:   UTI (urinary tract infection) Active Problems:   Acute kidney injury superimposed on chronic kidney disease (HCC)   Generalized weakness   Chronic acquired lymphedema   Non-insulin  treated type 2 diabetes mellitus (HCC)   Hypokalemia Diarrhea.  C. difficile ruled out.  Level of care: Med-Surg  Author: Yetta Blanch, MD  [facility] 03/20/2024 6:49 PM   If 7PM-7AM, please contact night-coverage at [email or web address]

## 2024-03-20 NOTE — ED Notes (Signed)
 Patient placed on bedpan. Patient also cleaned up by this tech and RN d/t having diarrhea. Patient provided new brief and clean chux. Call light within reach. She was encouraged to eat her breakfast and said she would try.

## 2024-03-20 NOTE — Evaluation (Signed)
 Occupational Therapy Evaluation Patient Details Name: Laurie Hunter MRN: [id number] DOB: 01/09/1946 Today's Date: 03/20/2024   History of Present Illness   Laurie Hunter is a 78 y.o. female who presented 9/3 for UTI, was treated and discharged. Pt came back 9/4 with persistent weakness and falls. MRI brain did not show any acute findings. PMHx: stroke in 2013 with left-sided deficits, essential hypertension, diabetes mellitus type 2, gout     Clinical Impressions Laurie Hunter was evaluated s/p the above admission list. Per her report, her sister assists her minimally with ADLs and mobility at baseline. Upon evaluation the pt was limited by weakness, unsteady balance, RLE pain, poor activity tolerance and chronic L hemi. Overall she needed max-total A for bed mobility, max A for 2x standing attempts and was unable to progress to SP transfer to the [facility]. Due to the deficits listed below the pt also needs up to total A for LB ADLs at bed level and mod A for UB ADLs. Pt will benefit from continued acute OT services and skilled inpatient follow up therapy, <3 hours/day.      If plan is discharge home, recommend the following:   A lot of help with walking and/or transfers;Two people to help with walking and/or transfers;A lot of help with bathing/dressing/bathroom;Two people to help with bathing/dressing/bathroom;Assistance with cooking/housework;Assistance with feeding;Assist for transportation;Help with stairs or ramp for entrance     Functional Status Assessment   Patient has had a recent decline in their functional status and demonstrates the ability to make significant improvements in function in a reasonable and predictable amount of time.     Equipment Recommendations   None recommended by OT      Precautions/Restrictions   Precautions Precautions: Fall Recall of Precautions/Restrictions: Intact Required Braces or Orthoses:  (L AFO) Restrictions Weight Bearing Restrictions Per  Provider Order: No     Mobility Bed Mobility Overal bed mobility: Needs Assistance Bed Mobility: Supine to Sit, Sit to Supine     Supine to sit: Max assist Sit to supine: Total assist, +2 for physical assistance, +2 for safety/equipment        Transfers Overall transfer level: Needs assistance   Transfers: Sit to/from Stand, Bed to chair/wheelchair/BSC Sit to Stand: Max assist Stand pivot transfers: Max assist, +2 physical assistance, +2 safety/equipment         General transfer comment: max A to stand with face to face transfer 2x. attempted a SP transfer to [facility], unable to safely complete despite max A +2      Balance Overall balance assessment: Needs assistance Sitting-balance support: Feet supported Sitting balance-Leahy Scale: Fair     Standing balance support: Bilateral upper extremity supported, During functional activity Standing balance-Leahy Scale: Poor                             ADL either performed or assessed with clinical judgement   ADL Overall ADL's : Needs assistance/impaired Eating/Feeding: Independent   Grooming: Set up;Sitting   Upper Body Bathing: Minimal assistance;Sitting   Lower Body Bathing: Total assistance   Upper Body Dressing : Moderate assistance   Lower Body Dressing: Total assistance   Toilet Transfer: Total assistance   Toileting- Clothing Manipulation and Hygiene: Total assistance       Functional mobility during ADLs: Maximal assistance;+2 for safety/equipment;+2 for physical assistance General ADL Comments: attempted a SP transfer from bed>BSC on eval, unable to safely complete.     Vision  Baseline Vision/History: 0 No visual deficits Vision Assessment?: No apparent visual deficits Additional Comments: did not assess on eval     Perception Perception: Within Functional Limits       Praxis Praxis: WFL       Pertinent Vitals/Pain Pain Assessment Pain Assessment: Faces Faces Pain Scale: Hurts  even more Pain Location: R distal LE Pain Descriptors / Indicators: Discomfort, Grimacing, Guarding Pain Intervention(s): Limited activity within patient's tolerance, Monitored during session     Extremity/Trunk Assessment Upper Extremity Assessment Upper Extremity Assessment: Generalized weakness   Lower Extremity Assessment Lower Extremity Assessment: Defer to PT evaluation   Cervical / Trunk Assessment Cervical / Trunk Assessment: Other exceptions Cervical / Trunk Exceptions: body habitus   Communication Communication Communication: No apparent difficulties   Cognition Arousal: Alert Behavior During Therapy: WFL for tasks assessed/performed Cognition: No family/caregiver present to determine baseline, No apparent impairments             OT - Cognition Comments: A&Ox4, recalled PLOF and home set up. Limited insight to deficits                 Following commands: Intact       Cueing  General Comments   Cueing Techniques: Verbal cues  VSS on RA           Home Living Family/patient expects to be discharged to:: Private residence Living Arrangements: Other relatives Available Help at Discharge: Family;Available 24 hours/day Type of Home: House Home Access: Ramped entrance     Home Layout: Two level;Able to live on main level with bedroom/bathroom;Full bath on main level     Bathroom Shower/Tub: Producer, television/film/video: Standard     Home Equipment: Rollator (4 wheels);Shower seat;Wheelchair - manual          Prior Functioning/Environment Prior Level of Function : Needs assist             Mobility Comments: Sister helps with transfers, pt states she was walking short distances with rollator ADLs Comments: Sister helps with ADLs, sister dons her AFO shoe on at bed level    OT Problem List: Decreased strength;Decreased range of motion;Impaired balance (sitting and/or standing);Decreased activity tolerance;Decreased safety  awareness;Decreased knowledge of use of DME or AE;Decreased knowledge of precautions   OT Treatment/Interventions: Self-care/ADL training;Therapeutic exercise;DME and/or AE instruction;Therapeutic activities;Patient/family education;Balance training      OT Goals(Current goals can be found in the care plan section)   Acute Rehab OT Goals Patient Stated Goal: to have a BM OT Goal Formulation: With patient Time For Goal Achievement: 04/03/24 Potential to Achieve Goals: Good ADL Goals Pt Will Perform Upper Body Dressing: with modified independence Pt Will Perform Lower Body Dressing: with mod assist;sit to/from stand Pt Will Transfer to Toilet: with mod assist;stand pivot transfer Additional ADL Goal #1: Pt will complete bed mobility with min A as a precursor to ADLs   OT Frequency:  Min 2X/week    Co-evaluation              AM-PAC OT 6 Clicks Daily Activity     Outcome Measure Help from another person eating meals?: None Help from another person taking care of personal grooming?: A Little Help from another person toileting, which includes using toliet, bedpan, or urinal?: Total Help from another person bathing (including washing, rinsing, drying)?: Total Help from another person to put on and taking off regular upper body clothing?: A Lot Help from another person to put on and taking  off regular lower body clothing?: Total 6 Click Score: 12   End of Session Equipment Utilized During Treatment: Gait belt Nurse Communication: Mobility status  Activity Tolerance: Patient tolerated treatment well Patient left: in bed;with call bell/phone within reach  OT Visit Diagnosis: Unsteadiness on feet (R26.81);Other abnormalities of gait and mobility (R26.89);Muscle weakness (generalized) (M62.81);Pain;History of falling (Z91.81)                Time: 9169-9098 OT Time Calculation (min): 31 min Charges:  OT General Charges $OT Visit: 1 Visit OT Evaluation $OT Eval Moderate  Complexity: 1 Mod OT Treatments $Self Care/Home Management : 8-22 mins  Laurie Hunter, OTR/L Acute Rehabilitation Services Office [phone] Secure Chat Communication Preferred   Laurie Hunter 03/20/2024, 9:55 AM

## 2024-03-20 NOTE — ED Provider Notes (Signed)
 [facility] Emergency Department Provider Note MRN:  [id number]  Arrival date & time: 03/20/24     Chief Complaint   Fall   History of Present Illness   Laurie Hunter is a 78 y.o. year-old female presents to the ED with chief complaint of fall.  Patient reports having generalized weakness for the past 7 to 10 days.  She states that she was seen here recently and diagnosed with a UTI.  She was prescribed Bactrim .  She states that she was walking with her walker and felt so weak that she fell to the ground.  She says feels like her balance is off.  She denies any fevers or chills.  She expresses some concern about stroke given prior history.SABRA  History provided by patient.   Review of Systems  Pertinent positive and negative review of systems noted in HPI.    Physical Exam   Vitals:   03/20/24 0215 03/20/24 0218  BP: (!) 126/58   Pulse: 65   Resp: 16   Temp:  98.2 F (36.8 C)  SpO2: 100%     CONSTITUTIONAL:  chronically ill-appearing, NAD NEURO:  Alert and oriented x 3, CN 3-12 grossly intact EYES:  eyes equal and reactive ENT/NECK:  Supple, no stridor  CARDIO:  normal rate, regular rhythm, appears well-perfused  PULM:  No respiratory distress, CTAB GI/GU:  non-distended, no focal abdominal tenderness MSK/SPINE:  No gross deformities, no edema, moves all extremities  SKIN:  no rash, atraumatic   *Additional and/or pertinent findings included in MDM below  Diagnostic and Interventional Summary    EKG Interpretation Date/Time:    Ventricular Rate:    PR Interval:    QRS Duration:    QT Interval:    QTC Calculation:   R Axis:      Text Interpretation:         Labs Reviewed  BASIC METABOLIC PANEL WITH GFR - Abnormal; Notable for the following components:      Result Value   Potassium 3.1 (*)    Glucose, Bld 68 (*)    Creatinine, Ser 1.91 (*)    Calcium  7.8 (*)    GFR, Estimated 27 (*)    All other components within normal limits   CBC WITH DIFFERENTIAL/PLATELET - Abnormal; Notable for the following components:   Hemoglobin 11.5 (*)    HCT 35.0 (*)    MCV 77.6 (*)    MCH 25.5 (*)    RDW 21.7 (*)    nRBC 0.6 (*)    All other components within normal limits  URINALYSIS, ROUTINE W REFLEX MICROSCOPIC - Abnormal; Notable for the following components:   APPearance CLOUDY (*)    Hgb urine dipstick SMALL (*)    Protein, ur 30 (*)    Leukocytes,Ua LARGE (*)    Bacteria, UA MANY (*)    All other components within normal limits  CBG MONITORING, ED - Abnormal; Notable for the following components:   Glucose-Capillary 57 (*)    All other components within normal limits  CBG MONITORING, ED - Abnormal; Notable for the following components:   Glucose-Capillary 111 (*)    All other components within normal limits    MR BRAIN WO CONTRAST  Final Result    CT Head Wo Contrast  Final Result      Medications  cefTRIAXone  (ROCEPHIN ) 1 g in sodium chloride  0.9 % 100 mL IVPB (has no administration in time range)     Procedures  /  Critical Care Procedures  ED Course and Medical Decision Making  I have reviewed the triage vital signs, the nursing notes, and pertinent available records from the EMR.  Social Determinants Affecting Complexity of Care: Patient has no clinically significant social determinants affecting this chief complaint..   ED Course:    Medical Decision Making Patient here with generalized weakness.  She had a fall today in which she was walking with her walker and dropped down to her knees.  She states that she felt like her legs would not support her.  She does report history of stroke and has some concerns about having recurrent stroke.  CT head ordered in triage was reassuring.  However, given her symptoms, will check MRI.  MRI shows no acute stroke.  Patient is noted to have been seen recently and was diagnosed with AKI and UTI.  She was discharged home on antibiotics, but has not been  improving.  She still has a mildly elevated creatinine, but this is improved from her most recent visit.  Her urinalysis is still consistent with infection.  Given that she is having falls and generalized weakness, I suspect that this is attributed to her UTI, and that she would be benefited from coming into the hospital for treatment with IV antibiotics as well as some fluids for her AKI.  Will consult hospitalist for admission.  Amount and/or Complexity of Data Reviewed Labs: ordered. Radiology: ordered.  Risk Decision regarding hospitalization.         Consultants: I consulted with Hospitalist, Dr. Verdene, who is appreciated for admitting.   Treatment and Plan: Patient's exam and diagnostic results are concerning for UTI and AKI and fall.  Feel that patient will need admission to the hospital for further treatment and evaluation.    Final Clinical Impressions(s) / ED Diagnoses     ICD-10-CM   1. Acute cystitis without hematuria  N30.00     2. Fall, initial encounter  [email or web address]       ED Discharge Orders     None         Discharge Instructions Discussed with and Provided to Patient:   Discharge Instructions   None      Vicky Charleston, PA-C 03/20/24 9547    Jerral Meth, MD 03/20/24 [id number]

## 2024-03-20 NOTE — Progress Notes (Signed)
 Patient given phone during admission to call

## 2024-03-20 NOTE — ED Notes (Signed)
 Patient transported to MRI

## 2024-03-20 NOTE — H&P (Signed)
 [facility] History and Physical  Laurie Hunter FMW:[id number] DOB: September 07, 1945 DOA: 03/19/2024   PCP: Ransom Other, MD  Specialists: None  Chief Complaint: Fall at home, generalized weakness  HPI: Laurie Hunter is a 78 y.o. female with a past medical history of stroke in 2013 with left-sided deficits, essential hypertension, diabetes mellitus type 2, gout who lives with her sister.  Patient presented to the emergency department on 9/3 with complaints of weakness.  She was diagnosed as having UTI and was discharged on Bactrim .  During that visit she underwent CT scan of the abdomen pelvis which did not show any acute finding except for fluid levels in the colon suggesting a diarrheal illness.  Patient has had diarrhea but said chronic issue for her as it has been ongoing for several weeks.  Today patient does mention dysuria.  Patient came back to the emergency department due to persistent weakness.  She has had falls at home.  She felt like she was dragging her left foot.  Denies any chest pain shortness of breath.  No nausea or vomiting.  Diarrhea appears to have slowed down in the last week or so.  In the emergency department she underwent blood work which showed hypokalemia.  During previous visit her creatinine was also elevated at 2.6 which has improved to 1.9 today.  Noted to be hypoglycemic.  UA was again abnormal.  MRI brain did not show any acute findings.  She was thought to have generalized weakness possibly from her urinary tract infection.  She has had multiple falls at home.  She would benefit from IV antibiotics along with physical therapy evaluation.  Home Medications: This list has not been reconciled yet Prior to Admission medications   Medication Sig Start Date End Date Taking? Authorizing Provider  acetaminophen  (TYLENOL ) 500 MG tablet Take 500-1,000 mg by mouth every 6 (six) hours as needed for mild pain (pain score 1-3) or moderate pain (pain score 4-6).    [provider]  allopurinol  (ZYLOPRIM ) 100 MG tablet Take 100 mg by mouth in the morning.    [provider]  amLODipine  (NORVASC ) 10 MG tablet Take 10 mg by mouth at bedtime.    [provider]  aspirin  81 MG tablet Take 81 mg by mouth in the morning.    [provider]  atenolol (TENORMIN) 50 MG tablet Take 50 mg by mouth in the morning.    [provider]  Bismuth  Subsalicylate (KAOPECTATE PO) Take 1-2 tablets by mouth as needed.    [provider]  bismuth  subsalicylate (PEPTO BISMOL) 262 MG chewable tablet Chew 524 mg by mouth as needed for indigestion or diarrhea or loose stools.    [provider]  carbamide peroxide (DEBROX) 6.5 % OTIC solution Place 5 drops into the right ear 2 (two) times daily. 11/01/23   Cindy Garnette POUR, MD  clopidogrel  (PLAVIX ) 75 MG tablet Take 75 mg by mouth in the morning.    [provider]  colchicine  0.6 MG tablet Take 0.6 mg by mouth 2 (two) times daily.    [provider]  diphenhydrAMINE  (BENADRYL ) 25 MG tablet Take 50 mg by mouth at bedtime. 10/17/12   [provider]  famotidine  (PEPCID ) 20 MG tablet Take 1 tablet (20 mg total) by mouth 2 (two) times daily. 10/17/12   Cleotilde Rogue, MD  FEROSUL 325 (65 Fe) MG tablet Take 325 mg by mouth in the morning. 03/08/20   [provider]  Loperamide   HCl (IMODIUM  PO) Take 1 tablet by mouth at bedtime as needed.    [provider]  melatonin 5 MG TABS Take 5 mg by mouth at bedtime as needed.    [provider]  metFORMIN (GLUCOPHAGE) 500 MG tablet Take 500 mg by mouth in the morning.    [provider]  sulfamethoxazole -trimethoprim  (BACTRIM  DS) 800-160 MG tablet Take 1 tablet by mouth 2 (two) times daily for 7 days. 03/18/24 03/25/24  Kammerer, Megan L, DO    Allergies:  Allergies  Allergen Reactions   Erythromycin Swelling   Lisinopril Swelling   Penicillins Swelling    Past Medical History: Past  Medical History:  Diagnosis Date   Diabetes mellitus without complication (HCC)    Hypertension    Stroke [facility])     Past Surgical History:  Procedure Laterality Date   ABDOMINAL HYSTERECTOMY     GALLBLADDER SURGERY      Social History: Lives with her sister.  No smoking alcohol use.  Family History:  Denies any health problems in the family  Review of Systems - History obtained from the patient General ROS: positive for  - fatigue Psychological ROS: negative Ophthalmic ROS: negative ENT ROS: negative Allergy and Immunology ROS: negative Hematological and Lymphatic ROS: negative Endocrine ROS: negative Respiratory ROS: no cough, shortness of breath, or wheezing Cardiovascular ROS: no chest pain or dyspnea on exertion Gastrointestinal ROS: As in HPI Genito-Urinary ROS: As in HPI Musculoskeletal ROS: negative Neurological ROS: no TIA or stroke symptoms Dermatological ROS: negative  Physical Examination  Vitals:   03/20/24 0100 03/20/24 0215 03/20/24 0218 03/20/24 0230  BP: (!) 141/44 (!) 126/58  (!) 110/43  Pulse:  65  67  Resp:  16  15  Temp:   98.2 F (36.8 C)   TempSrc:   Oral   SpO2:  100%  96%    BP (!) 110/43   Pulse 67   Temp 98.2 F (36.8 C) (Oral)   Resp 15   SpO2 96%   General appearance: alert, cooperative, appears stated age, and no distress Head: Normocephalic, without obvious abnormality, atraumatic Eyes: conjunctivae/corneas clear. PERRL, EOM's intact. Throat: lips, mucosa, and tongue normal; teeth and gums normal Neck: no adenopathy, no carotid bruit, no JVD, supple, symmetrical, trachea midline, and thyroid  not enlarged, symmetric, no tenderness/mass/nodules Back: symmetric, no curvature. ROM normal. No CVA tenderness. Resp: clear to auscultation bilaterally Cardio: regular rate and rhythm, S1, S2 normal, no murmur, click, rub or gallop GI: soft, non-tender; bowel sounds normal; no masses,  no organomegaly Extremities: extremities normal,  atraumatic, no cyanosis or edema Pulses: 2+ and symmetric Skin: Skin color, texture, turgor normal. No rashes or lesions Lymph nodes: Cervical, supraclavicular, and axillary nodes normal. Neurologic: Generalized weakness is noted.  No obvious focal neurological deficits are noted   Labs on Admission: I have personally reviewed following labs and imaging studies  CBC: Recent Labs  Lab 03/18/24 1300 03/18/24 1309 03/19/24 2151  WBC 6.0  --  6.5  NEUTROABS 4.2  --  4.5  HGB 10.7* 10.9* 11.5*  HCT 33.1* 32.0* 35.0*  MCV 79.0*  --  77.6*  PLT 171  --  173   Basic Metabolic Panel: Recent Labs  Lab 03/18/24 1309 03/19/24 2151  NA 139 135  K 3.1* 3.1*  CL 104 100  CO2  --  22  GLUCOSE 70 68*  BUN 22 18  CREATININE 2.60* 1.91*  CALCIUM   --  7.8*   GFR: CrCl cannot be  calculated (Unknown ideal weight.). Liver Function Tests: Recent Labs  Lab 03/18/24 1300  AST 64*  ALT 38  ALKPHOS 107  BILITOT 0.9  PROT 6.4*  ALBUMIN 3.1*   Recent Labs  Lab 03/18/24 1300  LIPASE 21    CBG: Recent Labs  Lab 03/20/24 0336 03/20/24 0425  GLUCAP 57* 111*     Radiological Exams on Admission: MR BRAIN WO CONTRAST Result Date: 03/20/2024 EXAM: MRI BRAIN WITHOUT CONTRAST 03/20/2024 01:40:00 AM TECHNIQUE: Multiplanar multisequence MRI of the head/brain was performed without the administration of intravenous contrast. COMPARISON: 06/27/2010 CLINICAL HISTORY: Transient ischemic attack (TIA). FINDINGS: BRAIN AND VENTRICLES: No acute infarct. No intracranial hemorrhage. No mass. No midline shift. No hydrocephalus. The sella is unremarkable. Normal flow voids. Multifocal hyperintense T2-weighted signal within the cerebral white matter, most commonly due to chronic small vessel disease. ORBITS: No acute abnormality. SINUSES AND MASTOIDS: No acute abnormality. BONES AND SOFT TISSUES: Normal marrow signal. No acute soft tissue abnormality. IMPRESSION: 1. No acute intracranial abnormality. 2.  Multifocal hyperintense T2-weighted signal within the cerebral white matter, most commonly due to chronic small vessel disease. Electronically signed by: Franky Stanford MD 03/20/2024 01:57 AM EDT RP Workstation: [id number]   CT Head Wo Contrast Result Date: 03/19/2024 CLINICAL DATA:  Fall injury, neuro deficit, stroke suspected. EXAM: CT HEAD WITHOUT CONTRAST TECHNIQUE: Contiguous axial images were obtained from the base of the skull through the vertex without intravenous contrast. RADIATION DOSE REDUCTION: This exam was performed according to the departmental dose-optimization program which includes automated exposure control, adjustment of the mA and/or kV according to patient size and/or use of iterative reconstruction technique. COMPARISON:  CT scan head 06/28/2010 FINDINGS: Brain: Positioning is nonstandard due to the being tilted obliquely to the left. There is mild global atrophy and mild small-vessel disease of the cerebral white matter, progressed from 2011. The ventricles remain normal in size and position. Basal cisterns are clear. No cortical based acute infarct, hemorrhage, mass or mass effect is seen. There is a chronic right pontine lacunar infarct. Vascular: There are moderate calcifications in the carotid siphons. No hyperdense central vessel is seen. Skull: Negative for fractures or focal lesions. Sinuses/Orbits: Clear visualized sinuses, right mastoid air cells. There is fluid in the left mastoid tip. Above this the left mastoids are otherwise clear. Other: None. IMPRESSION: 1. No acute intracranial CT findings or depressed skull fractures. 2. Mild atrophy and small-vessel disease. 3. Chronic right pontine lacunar infarct. 4. Left mastoid tip effusion. Electronically Signed   By: Francis Quam M.D.   On: 03/19/2024 21:46   CT ABDOMEN PELVIS WO CONTRAST Result Date: 03/18/2024 CLINICAL DATA:  Left lower quadrant abdominal pain. EXAM: CT ABDOMEN AND PELVIS WITHOUT CONTRAST TECHNIQUE:  Multidetector CT imaging of the abdomen and pelvis was performed following the standard protocol without IV contrast. RADIATION DOSE REDUCTION: This exam was performed according to the departmental dose-optimization program which includes automated exposure control, adjustment of the mA and/or kV according to patient size and/or use of iterative reconstruction technique. COMPARISON:  None Available. FINDINGS: Lower chest: No acute abnormality. Hepatobiliary: No suspicious focal hepatic lesion identified within the limits of an unenhanced exam. Status post cholecystectomy. No biliary dilatation. Pancreas: Unremarkable Spleen: Unremarkable Adrenals/Urinary Tract: Adrenal glands are unremarkable. 1.8 cm left renal cyst, for which no follow-up imaging is recommended. No renal or ureteral calculi. No hydronephrosis. Bladder is unremarkable. Stomach/Bowel: The stomach and small bowel are grossly unremarkable. No obstruction. Appendix is not clearly identified. No pericecal inflammatory changes. Air-fluid  levels throughout the colon, which can be seen in the setting of diarrheal illness. Vascular/Lymphatic: Aortic atherosclerosis. Nonaneurysmal abdominal aorta. No enlarged abdominal or pelvic lymph nodes. Reproductive: Status post hysterectomy. No adnexal masses. Other: Fat containing umbilical hernia with the hernia neck measuring approximately 3.8 cm transverse. Musculoskeletal: No acute osseous abnormality. No suspicious osseous lesion. Degenerative changes of the thoracolumbar spine. IMPRESSION: 1. Air-fluid levels throughout the colon, which can be seen in the setting of diarrheal illness. 2. Otherwise, no acute localizing findings in the abdomen or pelvis. 3. Fat containing umbilical hernia. 4.  Aortic Atherosclerosis (ICD10-I70.0). Electronically Signed   By: Harrietta Sherry M.D.   On: 03/18/2024 12:16     Problem List  Principal Problem:   UTI (urinary tract infection) Active Problems:   Acute kidney  injury superimposed on chronic kidney disease (HCC)   Generalized weakness   Chronic acquired lymphedema   Non-insulin  treated type 2 diabetes mellitus (HCC)   Hypokalemia   Assessment: This is a 78 year old African-American female with past medical history as stated earlier comes in with multiple falls at home along with generalized weakness.  She was recently diagnosed with a urinary tract infection and was discharged from the ED on Bactrim .  It is quite possible that her generalized weakness could be from urinary tract infection along with dehydration.  She did have AKI during her visit to the emergency department.  No new strokes identified on MRI.  Plan: Acute urinary tract infection: Follow-up on urine cultures.  Continue with ceftriaxone .  WBC is noted to be normal.  She is afebrile.  Acute kidney injury with hypokalemia: Continue IV hydration.  Replace potassium.  Monitor urine output.  Avoid nephrotoxic agents.  Microcytic anemia: Check anemia panel.  No evidence of overt bleeding.  Diabetes mellitus type 2 with hyperglycemia: Likely due to poor oral intake.  Continue with IV fluids.  Check HbA1c.  Very sensitive SSI.  Generalized weakness: Most likely due to UTI.  She has had multiple falls.  PT and OT evaluation.  She may need rehab.  She is quite weak in the lower extremities but not much worse than usual according to the patient.  She denies any lower back pain.  No concerning findings in the musculoskeletal section on the CT scan of the abdomen pelvis done recently.  No clear indication to image her spine currently.  Medication list has not been reconciled yet.  DVT Prophylaxis: Subcutaneous heparin  Code Status: Full code Family Communication: Discussed with patient Disposition: To be determined Consults called: None Admission Status: Status is: Observation The patient remains OBS appropriate and will d/c before 2 midnights.    Severity of Illness: The appropriate  patient status for this patient is OBSERVATION. Observation status is judged to be reasonable and necessary in order to provide the required intensity of service to ensure the patient's safety. The patient's presenting symptoms, physical exam findings, and initial radiographic and laboratory data in the context of their medical condition is felt to place them at decreased risk for further clinical deterioration. Furthermore, it is anticipated that the patient will be medically stable for discharge from the hospital within 2 midnights of admission.    Further management decisions will depend on results of further testing and patient's response to treatment.   Trayvond Viets  [facility] Pager on [email or web address]  03/20/2024, 5:40 AM

## 2024-03-21 DIAGNOSIS — M170 Bilateral primary osteoarthritis of knee: Secondary | ICD-10-CM | POA: Diagnosis present

## 2024-03-21 DIAGNOSIS — K5730 Diverticulosis of large intestine without perforation or abscess without bleeding: Secondary | ICD-10-CM | POA: Diagnosis not present

## 2024-03-21 DIAGNOSIS — I69392 Facial weakness following cerebral infarction: Secondary | ICD-10-CM | POA: Diagnosis not present

## 2024-03-21 DIAGNOSIS — N342 Other urethritis: Secondary | ICD-10-CM | POA: Diagnosis not present

## 2024-03-21 DIAGNOSIS — N390 Urinary tract infection, site not specified: Secondary | ICD-10-CM | POA: Diagnosis not present

## 2024-03-21 DIAGNOSIS — Z7984 Long term (current) use of oral hypoglycemic drugs: Secondary | ICD-10-CM | POA: Diagnosis not present

## 2024-03-21 DIAGNOSIS — E11649 Type 2 diabetes mellitus with hypoglycemia without coma: Secondary | ICD-10-CM | POA: Diagnosis present

## 2024-03-21 DIAGNOSIS — N1832 Chronic kidney disease, stage 3b: Secondary | ICD-10-CM | POA: Diagnosis present

## 2024-03-21 DIAGNOSIS — R531 Weakness: Secondary | ICD-10-CM | POA: Diagnosis present

## 2024-03-21 DIAGNOSIS — K429 Umbilical hernia without obstruction or gangrene: Secondary | ICD-10-CM | POA: Diagnosis not present

## 2024-03-21 DIAGNOSIS — Z9049 Acquired absence of other specified parts of digestive tract: Secondary | ICD-10-CM | POA: Diagnosis not present

## 2024-03-21 DIAGNOSIS — Y92009 Unspecified place in unspecified non-institutional (private) residence as the place of occurrence of the external cause: Secondary | ICD-10-CM | POA: Diagnosis not present

## 2024-03-21 DIAGNOSIS — R109 Unspecified abdominal pain: Secondary | ICD-10-CM | POA: Diagnosis not present

## 2024-03-21 DIAGNOSIS — E1122 Type 2 diabetes mellitus with diabetic chronic kidney disease: Secondary | ICD-10-CM | POA: Diagnosis present

## 2024-03-21 DIAGNOSIS — N179 Acute kidney failure, unspecified: Secondary | ICD-10-CM | POA: Diagnosis present

## 2024-03-21 DIAGNOSIS — I129 Hypertensive chronic kidney disease with stage 1 through stage 4 chronic kidney disease, or unspecified chronic kidney disease: Secondary | ICD-10-CM | POA: Diagnosis present

## 2024-03-21 DIAGNOSIS — E876 Hypokalemia: Secondary | ICD-10-CM | POA: Diagnosis present

## 2024-03-21 DIAGNOSIS — Y9301 Activity, walking, marching and hiking: Secondary | ICD-10-CM | POA: Diagnosis present

## 2024-03-21 DIAGNOSIS — E538 Deficiency of other specified B group vitamins: Secondary | ICD-10-CM | POA: Diagnosis present

## 2024-03-21 DIAGNOSIS — E1165 Type 2 diabetes mellitus with hyperglycemia: Secondary | ICD-10-CM | POA: Diagnosis present

## 2024-03-21 DIAGNOSIS — W19XXXA Unspecified fall, initial encounter: Secondary | ICD-10-CM | POA: Diagnosis present

## 2024-03-21 DIAGNOSIS — N3000 Acute cystitis without hematuria: Secondary | ICD-10-CM | POA: Diagnosis not present

## 2024-03-21 DIAGNOSIS — R1311 Dysphagia, oral phase: Secondary | ICD-10-CM | POA: Diagnosis present

## 2024-03-21 DIAGNOSIS — R0602 Shortness of breath: Secondary | ICD-10-CM | POA: Diagnosis not present

## 2024-03-21 DIAGNOSIS — I890 Lymphedema, not elsewhere classified: Secondary | ICD-10-CM | POA: Diagnosis present

## 2024-03-21 DIAGNOSIS — K5900 Constipation, unspecified: Secondary | ICD-10-CM | POA: Diagnosis present

## 2024-03-21 DIAGNOSIS — I69391 Dysphagia following cerebral infarction: Secondary | ICD-10-CM | POA: Diagnosis not present

## 2024-03-21 DIAGNOSIS — D631 Anemia in chronic kidney disease: Secondary | ICD-10-CM | POA: Diagnosis present

## 2024-03-21 DIAGNOSIS — M109 Gout, unspecified: Secondary | ICD-10-CM | POA: Diagnosis present

## 2024-03-21 DIAGNOSIS — R140 Abdominal distension (gaseous): Secondary | ICD-10-CM | POA: Diagnosis not present

## 2024-03-21 DIAGNOSIS — E66813 Obesity, class 3: Secondary | ICD-10-CM | POA: Diagnosis present

## 2024-03-21 DIAGNOSIS — Z6841 Body Mass Index (BMI) 40.0 and over, adult: Secondary | ICD-10-CM | POA: Diagnosis not present

## 2024-03-21 DIAGNOSIS — D539 Nutritional anemia, unspecified: Secondary | ICD-10-CM | POA: Diagnosis present

## 2024-03-21 LAB — COMPREHENSIVE METABOLIC PANEL WITH GFR
ALT: 32 U/L (ref 0–44)
AST: 43 U/L — ABNORMAL HIGH (ref 15–41)
Albumin: 2.6 g/dL — ABNORMAL LOW (ref 3.5–5.0)
Alkaline Phosphatase: 105 U/L (ref 38–126)
Anion gap: 10 (ref 5–15)
BUN: 13 mg/dL (ref 8–23)
CO2: 24 mmol/L (ref 22–32)
Calcium: 7.5 mg/dL — ABNORMAL LOW (ref 8.9–10.3)
Chloride: 103 mmol/L (ref 98–111)
Creatinine, Ser: 1.49 mg/dL — ABNORMAL HIGH (ref 0.44–1.00)
GFR, Estimated: 36 mL/min — ABNORMAL LOW (ref >60)
Glucose, Bld: 75 mg/dL (ref 70–99)
Potassium: 3 mmol/L — ABNORMAL LOW (ref 3.5–5.1)
Sodium: 137 mmol/L (ref 135–145)
Total Bilirubin: 1 mg/dL (ref 0.0–1.2)
Total Protein: 5.3 g/dL — ABNORMAL LOW (ref 6.5–8.1)

## 2024-03-21 LAB — CBC
HCT: 28.3 % — ABNORMAL LOW (ref 36.0–46.0)
Hemoglobin: 9.5 g/dL — ABNORMAL LOW (ref 12.0–15.0)
MCH: 25.6 pg — ABNORMAL LOW (ref 26.0–34.0)
MCHC: 33.6 g/dL (ref 30.0–36.0)
MCV: 76.3 fL — ABNORMAL LOW (ref 80.0–100.0)
Platelets: 146 K/uL — ABNORMAL LOW (ref 150–400)
RBC: 3.71 MIL/uL — ABNORMAL LOW (ref 3.87–5.11)
RDW: 21.4 % — ABNORMAL HIGH (ref 11.5–15.5)
WBC: 4.8 K/uL (ref 4.0–10.5)
nRBC: 0.8 % — ABNORMAL HIGH (ref 0.0–0.2)

## 2024-03-21 LAB — RETICULOCYTES
Immature Retic Fract: 33 % — ABNORMAL HIGH (ref 2.3–15.9)
RBC.: 3.66 MIL/uL — ABNORMAL LOW (ref 3.87–5.11)
Retic Count, Absolute: 76.9 K/uL (ref 19.0–186.0)
Retic Ct Pct: 2.1 % (ref 0.4–3.1)

## 2024-03-21 LAB — FOLATE: Folate: 5.6 ng/mL — ABNORMAL LOW (ref >5.9)

## 2024-03-21 LAB — IRON AND TIBC
Iron: 48 ug/dL (ref 28–170)
Saturation Ratios: 20 % (ref 10.4–31.8)
TIBC: 242 ug/dL — ABNORMAL LOW (ref 250–450)
UIBC: 194 ug/dL

## 2024-03-21 LAB — MAGNESIUM: Magnesium: 0.8 mg/dL — CL (ref 1.7–2.4)

## 2024-03-21 LAB — GLUCOSE, CAPILLARY
Glucose-Capillary: 69 mg/dL — ABNORMAL LOW (ref 70–99)
Glucose-Capillary: 140 mg/dL — ABNORMAL HIGH (ref 70–99)
Glucose-Capillary: 96 mg/dL (ref 70–99)
Glucose-Capillary: 97 mg/dL (ref 70–99)
Glucose-Capillary: 128 mg/dL — ABNORMAL HIGH (ref 70–99)

## 2024-03-21 LAB — HEMOGLOBIN A1C
Hgb A1c MFr Bld: 4.4 % — ABNORMAL LOW (ref 4.8–5.6)
Mean Plasma Glucose: 79.58 mg/dL

## 2024-03-21 LAB — FERRITIN: Ferritin: 71 ng/mL (ref 11–307)

## 2024-03-21 LAB — VITAMIN B12: Vitamin B-12: 1427 pg/mL — ABNORMAL HIGH (ref 180–914)

## 2024-03-21 MED ORDER — ENSURE PLUS HIGH PROTEIN PO LIQD
237.0000 mL | Freq: Three times a day (TID) | ORAL | Status: DC
  Administered 2024-03-21 – 2024-03-23 (×5): 237 mL via ORAL

## 2024-03-21 MED ORDER — MAGNESIUM SULFATE 4 GM/100ML IV SOLN
4.0000 g | Freq: Once | INTRAVENOUS | Status: AC
  Administered 2024-03-21: 4 g via INTRAVENOUS
  Filled 2024-03-21: qty 100

## 2024-03-21 MED ORDER — POTASSIUM CHLORIDE CRYS ER 20 MEQ PO TBCR
40.0000 meq | EXTENDED_RELEASE_TABLET | ORAL | Status: AC
  Administered 2024-03-21 (×2): 40 meq via ORAL
  Filled 2024-03-21 (×2): qty 2

## 2024-03-21 MED ORDER — FOLIC ACID 1 MG PO TABS
1.0000 mg | ORAL_TABLET | Freq: Every day | ORAL | Status: DC
  Administered 2024-03-21 – 2024-03-27 (×7): 1 mg via ORAL
  Filled 2024-03-21 (×7): qty 1

## 2024-03-21 MED ORDER — CALCIUM GLUCONATE-NACL 1-0.675 GM/50ML-% IV SOLN
1.0000 g | Freq: Once | INTRAVENOUS | Status: AC
  Administered 2024-03-21: 1000 mg via INTRAVENOUS
  Filled 2024-03-21: qty 50

## 2024-03-21 MED ORDER — DICLOFENAC SODIUM 1 % EX GEL
2.0000 g | Freq: Four times a day (QID) | CUTANEOUS | Status: DC
  Administered 2024-03-21 – 2024-03-27 (×19): 2 g via TOPICAL
  Filled 2024-03-21 (×2): qty 100

## 2024-03-21 NOTE — Evaluation (Signed)
 Physical Therapy Evaluation Patient Details Name: Laurie Hunter MRN: [id number] DOB: 14-Dec-1945 Today's Date: 03/21/2024  History of Present Illness  Laurie Hunter is a 78 y.o. female who presented 9/3 for UTI, was treated and discharged. Pt came back 9/4 with persistent weakness and falls. MRI brain did not show any acute findings. PMHx: stroke in 2013 with left-sided deficits, essential hypertension, diabetes mellitus type 2, gout  Clinical Impression  Pt is presenting slightly below baseline level of functioning. Currently pt is Mod A for bed mobility, Mod A for sit to stand and Max A for transfers; once in standing pt Min A for stability with high fear of falling. Pt is very motivated to get home. Due to pt current functional status, home set up and available assistance at home recommending skilled physical therapy services < 3 hours/day in order to address strength, balance and functional mobility to decrease risk for falls, injury, immobility, skin break down and re-hospitalization.          If plan is discharge home, recommend the following: A lot of help with walking and/or transfers;Assist for transportation;Assistance with cooking/housework;Help with stairs or ramp for entrance   Can travel by private vehicle   No    Equipment Recommendations Hospital bed;Hoyer lift     Functional Status Assessment Patient has had a recent decline in their functional status and demonstrates the ability to make significant improvements in function in a reasonable and predictable amount of time.     Precautions / Restrictions Precautions Precautions: Fall Recall of Precautions/Restrictions: Intact Required Braces or Orthoses: Other Brace Other Brace: pt has AFO to wear in standing for stability it is new and pt does not like it. It is too large and too heavy for pt to manipulate her LLE Restrictions Weight Bearing Restrictions Per Provider Order: No      Mobility  Bed Mobility Overal bed  mobility: Needs Assistance Bed Mobility: Supine to Sit     Supine to sit: Mod assist, HOB elevated     General bed mobility comments: Mod A with verbal cues for sequencing and body mechanics.    Transfers Overall transfer level: Needs assistance Equipment used: Rolling walker (2 wheels) Transfers: Sit to/from Stand, Bed to chair/wheelchair/BSC Sit to Stand: Mod assist   Step pivot transfers: Max assist       General transfer comment: Pt with RW (needs youth RW) standing 4x during session with increased time; very kyphotic posture pt taking steps from EOB to recliner with increased tim and Min A for stability. Pt with difficulty managing the LLE requiring Min A for wgt shifting to progress the LLE. Heavy multi modal cues for safety and anxiety with high fear of falling. Pt very motivated. Mod A for sit to stand and Max A for transfers with Min A overall for steps from EOB to recliner. Verbal cues throughout for sequencing, body mechanics and coordination    Ambulation/Gait     General Gait Details: pt is non-ambulatory at baseline      Balance Overall balance assessment: Needs assistance Sitting-balance support: Feet supported Sitting balance-Leahy Scale: Good     Standing balance support: Bilateral upper extremity supported, During functional activity, Reliant on assistive device for balance Standing balance-Leahy Scale: Poor Standing balance comment: Min A external support         Pertinent Vitals/Pain Pain Assessment Pain Assessment: Faces Faces Pain Scale: Hurts even more Pain Location: R distal LE Pain Descriptors / Indicators: Discomfort, Grimacing, Guarding Pain Intervention(s):  Monitored during session, Limited activity within patient's tolerance    Home Living Family/patient expects to be discharged to:: Private residence Living Arrangements: Other relatives Available Help at Discharge: Family;Available 24 hours/day Type of Home: House Home Access:  Ramped entrance       Home Layout: Two level;Able to live on main level with bedroom/bathroom;Full bath on main level Home Equipment: Rollator (4 wheels);Shower seat;Wheelchair - manual      Prior Function Prior Level of Function : Needs assist             Mobility Comments: Sister helps with transfers, pt states she was walking short distances with rollator ADLs Comments: Sister helps with ADLs, sister dons her AFO shoe on at bed level     Extremity/Trunk Assessment   Upper Extremity Assessment Upper Extremity Assessment: Defer to OT evaluation;Generalized weakness;LUE deficits/detail LUE Deficits / Details: weakness in the L hand from previous stroke    Lower Extremity Assessment Lower Extremity Assessment: Generalized weakness;RLE deficits/detail;LLE deficits/detail RLE Deficits / Details: thigh lymphedema large lobule LLE Deficits / Details: thigh lymphedema large lobule, weakness at the ankle and knee due to previous stroke LLE Sensation: decreased light touch LLE Coordination: decreased gross motor       Communication   Communication Communication: No apparent difficulties    Cognition Arousal: Alert Behavior During Therapy: WFL for tasks assessed/performed, Lability   PT - Cognitive impairments: No apparent impairments       PT - Cognition Comments: Pt down due to concerns that she won't be able to make it home to spend time with sister who has a cancer diagnosis. Following commands: Intact       Cueing Cueing Techniques: Verbal cues     General Comments General comments (skin integrity, edema, etc.): Pt short of breathe after transfer; very fatigued.        Assessment/Plan    PT Assessment Patient needs continued PT services  PT Problem List Decreased strength;Decreased activity tolerance;Decreased balance;Decreased mobility;Decreased range of motion       PT Treatment Interventions DME instruction;Balance training;Functional mobility  training;Therapeutic activities;Therapeutic exercise;Patient/family education;Wheelchair mobility training    PT Goals (Current goals can be found in the Care Plan section)  Acute Rehab PT Goals Patient Stated Goal: to get home as soon as possible PT Goal Formulation: With patient Time For Goal Achievement: 04/04/24 Potential to Achieve Goals: Fair    Frequency Min 2X/week        AM-PAC PT 6 Clicks Mobility  Outcome Measure Help needed turning from your back to your side while in a flat bed without using bedrails?: A Lot Help needed moving from lying on your back to sitting on the side of a flat bed without using bedrails?: A Lot Help needed moving to and from a bed to a chair (including a wheelchair)?: A Lot Help needed standing up from a chair using your arms (e.g., wheelchair or bedside chair)?: A Lot Help needed to walk in hospital room?: Total Help needed climbing 3-5 steps with a railing? : Total 6 Click Score: 10    End of Session Equipment Utilized During Treatment: Gait belt Activity Tolerance: Patient tolerated treatment well;Patient limited by fatigue Patient left: in chair;with call bell/phone within reach Nurse Communication: Mobility status PT Visit Diagnosis: Unsteadiness on feet (R26.81);Other abnormalities of gait and mobility (R26.89);Muscle weakness (generalized) (M62.81)    Time: 8760-8675 PT Time Calculation (min) (ACUTE ONLY): 45 min   Charges:   PT Evaluation $PT Eval Low Complexity: 1 Low  PT Treatments $Therapeutic Activity: 23-37 mins PT General Charges $$ ACUTE PT VISIT: 1 Visit         Dorothyann Maier, DPT, CLT  Acute Rehabilitation Services Office: [phone] (Secure chat preferred)   Dorothyann VEAR Maier 03/21/2024, 1:39 PM

## 2024-03-21 NOTE — Progress Notes (Signed)
 9/6 Patient tried to sign, but had a hard time doing so. Patient gave verbal consent due to inability to sign.

## 2024-03-21 NOTE — Hospital Course (Addendum)
 PMH of CVA with left-sided weakness and left facial droop, HTN, type II DM, gout presents to the hospital with complaints of generalized weakness and fatigue. Currently being treated for UTI and electrolyte deficiency.  Assessment and Plan: Possible UTI. Presents with generalized weakness.  UA had some pyuria. Was started on IV antibiotic. No cultures were sent.  Completed 3 days of IV antibiotic.   AKI on CKD stage IIIb. With hypokalemia hypomagnesemia and hypocalcemia. Baseline serum creatinine 1.3.  On admission serum creatinine 2.6. improving with IV hydration.  Replaced potassium replacing magnesium  and calcium .  Folate deficiency. Replacing orally. Folic acid  5.6. B12 1427. Iron  48.  Nutritional deficiency anemia. Hemoglobin stable around 10 at baseline. Monitor.  Prior CVA.  With residual weakness and facial droop on left On aspirin  and Plavix  at home. Currently continuing. CT of the head as well as MRI brain negative for any acute stroke.  HTN. Blood pressure soft. On Norvasc , atenolol.  HCTZ currently on hold.  Gout. On colchicine  as well as allopurinol  at home. Will continue.  Chronic bilateral knee arthritis. Voltaren  gel 4 times daily.  Type 2 diabetes mellitus, un-controlled with hyperglycemia, without long-term insulin  use with CKD. Holding oral hypoglycemic agent. Currently sliding scale insulin .  Diarrhea. C. difficile ruled out.  Obesity Class 3 Body mass index is 48.73 kg/m.  Placing the pt at higher risk of poor outcomes.   Dysphagia. SLP seeing the patient. Currently on dysphagia 2 diet.

## 2024-03-21 NOTE — Evaluation (Signed)
 Speech Language Pathology Evaluation Patient Details Name: CARIA TRANSUE MRN: [id number] DOB: 30-Jan-1946 Today's Date: 03/21/2024 Time: 1401-1430 SLP Time Calculation (min) (ACUTE ONLY): 29 min  Problem List:  Patient Active Problem List   Diagnosis Date Noted   UTI (urinary tract infection) 03/20/2024   Hypokalemia 03/20/2024   Diarrhea 10/28/2023   Acute kidney injury superimposed on chronic kidney disease (HCC) 10/28/2023   CVA (cerebral vascular accident) (HCC) 10/28/2023   Non-insulin  treated type 2 diabetes mellitus (HCC) 10/28/2023   Generalized weakness 10/28/2023   Hyperlipidemia 10/28/2023   Nonhealing pressure ulcer of the left lower extremity 10/28/2023   Anemia of chronic disease 10/28/2023   Gout 10/28/2023   AKI (acute kidney injury) (HCC) 10/28/2023   Morbid obesity with BMI of 70 and over, adult (HCC) 10/28/2023   Chronic acquired lymphedema 12/17/2013   Difficulty walking 12/17/2013   Past Medical History:  Past Medical History:  Diagnosis Date   Diabetes mellitus without complication (HCC)    Hypertension    Stroke [facility])    Past Surgical History:  Past Surgical History:  Procedure Laterality Date   ABDOMINAL HYSTERECTOMY     GALLBLADDER SURGERY     HPI:  JAQUASIA DOSCHER is a 78 y.o. female with a past medical history of stroke in 2013 with left-sided deficits, essential hypertension, diabetes mellitus type 2, gout who lives with her sister.  Patient presented to the emergency department on 9/3 with complaints of weakness.  She was diagnosed as having UTI and was discharged on Bactrim .  During that visit she underwent CT scan of the abdomen pelvis which did not show any acute finding except for fluid levels in the colon suggesting a diarrheal illness. MRI negative for acute findings.  ST consulted for speech/language cognitive assessment.   Assessment / Plan / Recommendation Clinical Impression  Pt seen for speech/language cognitive assessment via portions  of SL[facility] ([employer] Mental Status Examination, informal observations/assessment, chart review, nursing report and pt report.  Pt's speech was judged to be intelligible within complex conversation, although she was noted to be verbose during interactions.  Pt appreciative of services provided to her within hospital setting this admission.  Pt demonstrated adequate problem solving skills with functional tasks, could answer personal information questions and provide detailed medical history.  She exhibited accurate safety awareness regarding limitations/deficits.  She stated needs at home regarding ADL's and support she receives from family members to A prn.  Pt's auditory comprehension and verbal expression were all adequate during various tasks.  Pt able to sustain attention for digit reversal, paragraph retention and complex conversation exchange.  No further ST needs required for speech/language and cognition.  Thank you for this consult.    SLP Assessment  SLP Recommendation/Assessment: Patient does not need any further Speech Language Pathology Services SLP Visit Diagnosis: Cognitive communication deficit (R41.841)     Assistance Recommended at Discharge  PRN  Functional Status Assessment Patient has had a recent decline in their functional status and demonstrates the ability to make significant improvements in function in a reasonable and predictable amount of time.  Frequency and Duration  (evaluation only)         SLP Evaluation Cognition  Overall Cognitive Status: Within Functional Limits for tasks assessed Arousal/Alertness: Awake/alert Orientation Level: Oriented X4 Attention: Sustained Sustained Attention: Appears intact Awareness: Appears intact Problem Solving: Appears intact Behaviors: Other (comment) (verbose) Safety/Judgment: Appears intact       Comprehension  Auditory Comprehension Overall Auditory Comprehension: Appears within  functional limits for tasks  assessed Conversation: Complex Interfering Components: Other (comment) (Pt stated things are a little slower) Visual Recognition/Discrimination Discrimination: Not tested Reading Comprehension Reading Status: Not tested    Expression Expression Primary Mode of Expression: Verbal Verbal Expression Overall Verbal Expression: Appears within functional limits for tasks assessed Level of Generative/Spontaneous Verbalization: Conversation Naming: No impairment Pragmatics: No impairment Non-Verbal Means of Communication: Not applicable Written Expression Dominant Hand: Right Written Expression: Not tested   Oral / Motor  Oral Motor/Sensory Function Overall Oral Motor/Sensory Function: Within functional limits Motor Speech Overall Motor Speech: Appears within functional limits for tasks assessed Respiration: Within functional limits Phonation: Normal Resonance: Within functional limits Articulation: Within functional limitis Intelligibility: Intelligible Motor Planning: Within functional limits Motor Speech Errors: Not applicable Interfering Components: Inadequate dentition            Pat Ladasha Schnackenberg,M.S.,CCC-SLP 03/21/2024, 2:40 PM

## 2024-03-21 NOTE — Care Management Obs Status (Signed)
 MEDICARE OBSERVATION STATUS NOTIFICATION   Patient Details  Name: Laurie Hunter MRN: [id number] Date of Birth: 1946/03/24   Medicare Observation Status Notification Given:  Yes    Jon Cruel 03/21/2024, 12:44 PM

## 2024-03-21 NOTE — Progress Notes (Signed)
   03/21/24 1400  Spiritual Encounters  Type of Visit Initial  Care provided to: Patient  Conversation partners present during encounter Other (comment) Futures trader)  Referral source Nurse (RN/NT/LPN)  Reason for visit Routine spiritual support  OnCall Visit No  Spiritual Framework  Presenting Themes Meaning/purpose/sources of inspiration;Goals in life/care;Values and beliefs;Significant life change;Caregiving needs;Coping tools;Impactful experiences and emotions;Courage hope and growth  Patient Stress Factors Lack of knowledge  Family Stress Factors None identified  Interventions  Spiritual Care Interventions Made Established relationship of care and support;Compassionate presence;Reflective listening;Normalization of emotions;Narrative/life review;Meaning making;Explored values/beliefs/practices/strengths;Prayer  Intervention Outcomes  Outcomes Connection to spiritual care;Awareness around self/spiritual resourses;Awareness of health;Awareness of support;Reduced anxiety;Reduced isolation   Chaplain met with Laurie Hunter at bedside - she was very talkative and spoke of her feelings regarding her pesent state of health.  She discussed at length her family and their sporitual testimony to her over th e years.  Laurie Hunter expressed her feelings of gratitude for the staff of [facility].

## 2024-03-21 NOTE — Progress Notes (Signed)
 [facility] Progress Note Patient: Laurie Hunter FMW:[id number] DOB: September 26, 1945  DOA: 03/19/2024 DOS: the patient was seen and examined on 03/21/2024  Brief Hospital Course: PMH of CVA with left-sided weakness and left facial droop, HTN, type II DM, gout presents to the hospital with complaints of generalized weakness and fatigue. Currently being treated for UTI and electrolyte deficiency.  Assessment and Plan: Possible UTI. Currently receiving IV antibiotic. Will continue the same. Follow-up on cultures.  AKI on CKD stage IIIb. With hypokalemia hypomagnesemia and hypocalcemia. Baseline serum creatinine 1.3.  On admission serum creatinine 2.6. Currently improving with IV hydration. Replacing potassium replacing magnesium  and calcium . Corrected calcium  for albumin is 8.6.  Folate deficiency. Replacing orally. Folic acid  5.6. B12 1427. Iron  48.  Nutritional deficiency anemia. Hemoglobin stable around 10 at baseline. Currently 9.5 likely due to dilutional. Monitor.  Prior CVA. On aspirin  and Plavix  at home. Currently continuing. CT of the head as well as MRI brain negative for any acute stroke.  HTN. Blood pressure soft. On Norvasc , atenolol.  HCTZ currently on hold.  Gout. On colchicine  as well as allopurinol  at home. Will continue.  Bilateral knee arthritis. Chronic. Voltaren  gel 4 times daily.  Type 2 diabetes mellitus, un-controlled without long-term insulin  use with CKD. Holding oral hypoglycemic agent. Currently sliding scale insulin . Has hypoglycemia.  Add Ensure.  Diarrhea. C. difficile ruled out. Monitor for GI pathogen panel.  Obesity Class 3 Body mass index is 48.73 kg/m.  Placing the pt at higher risk of poor outcomes.    Subjective: No nausea no vomiting.  Minimal oral intake.  No fever no chills.  Physical Exam: Clear to auscultation. S1-S2 present but Left-sided weakness as well as left facial droop. Bowel sound present.   Nontender.  Data Reviewed: I have Reviewed nursing notes, Vitals, and Lab results. Since last encounter, pertinent lab results CBC and BMP   . I have ordered test including CBC and BMP  .   Disposition: Status is: Inpatient Remains inpatient appropriate because: Monitor for improvement in oral intake  heparin  injection 5,000 Units Start: 03/20/24 0600   Family Communication: No one at bedside Level of care: Med-Surg   Vitals:   03/20/24 1915 03/21/24 0505 03/21/24 1003 03/21/24 1620  BP: 132/64 138/63 (!) 130/53 (!) 130/57  Pulse: 69 73 76 76  Resp:  19 18 18   Temp: 98.5 F (36.9 C) 98.7 F (37.1 C) 98.9 F (37.2 C) 98.8 F (37.1 C)  TempSrc: Oral Oral Oral Oral  SpO2:  100% 100% 100%  Weight:      Height:         Author: Yetta Blanch, MD 03/21/2024 6:23 PM  Please look on [email or web address] to find out who is on call.

## 2024-03-21 NOTE — Evaluation (Signed)
 Clinical/Bedside Swallow Evaluation Patient Details  Name: Laurie Hunter MRN: [id number] Date of Birth: [birth year]  Today's Date: 03/21/2024 Time: SLP Start Time (ACUTE ONLY): 1401 SLP Stop Time (ACUTE ONLY): 1430 SLP Time Calculation (min) (ACUTE ONLY): 29 min  Past Medical History:  Past Medical History:  Diagnosis Date   Diabetes mellitus without complication (HCC)    Hypertension    Stroke [facility])    Past Surgical History:  Past Surgical History:  Procedure Laterality Date   ABDOMINAL HYSTERECTOMY     GALLBLADDER SURGERY     HPI:  Laurie Hunter is a 78 y.o. female with a past medical history of stroke in 2013 with left-sided deficits, essential hypertension, diabetes mellitus type 2, gout who lives with her sister.  Patient presented to the emergency department on 9/3 with complaints of weakness.  She was diagnosed as having UTI and was discharged on Bactrim .  During that visit she underwent CT scan of the abdomen pelvis which did not show any acute finding except for fluid levels in the colon suggesting a diarrheal illness. MRI negative for acute findings.  Nursing concerned with pocketing food during meals.  ST consulted for clinical swallow evaluation.    Assessment / Plan / Recommendation  Clinical Impression  Pt seen for clinical swallow evaluation with mild oral dysphagia present per pt/nursing report, observation and evidence of L lateral retention with solids.  Impaired mastication noted also d/t limited dentition, but adequate for propulsion when consistency was moist in nature.  Pt noted food pocketing/being retained on L lateral sulcus intermittently during meal with pt's awareness decreased.  Pt consumed thin, puree and soft solids during evaluation and min oral retention noted.  Pt stated when food is sticky and/or dry, this happens more frequently.  No overt s/s of aspiration noted throughout po trial.  Recommend downgrading diet to Dysphagia 2(minced)/thin liquids for ease  of transition/pt preference and to reduce dysphagia symptoms.  Pt in agreement.  ST will f/u briefly for diet tolerance/education re: dysphagia.  Thank you for this consult. SLP Visit Diagnosis: Dysphagia, unspecified (R13.10)    Aspiration Risk  Mild aspiration risk    Diet Recommendation   Thin;Dysphagia 2 (chopped)  Medication Administration: Whole meds with liquid (single pills administered for safety)    Other  Recommendations Oral Care Recommendations: Oral care BID     Assistance Recommended at Discharge PRN  Functional Status Assessment Patient has had a recent decline in their functional status and demonstrates the ability to make significant improvements in function in a reasonable and predictable amount of time.  Frequency and Duration min 1 x/week  1 week       Prognosis Prognosis for improved oropharyngeal function: Good      Swallow Study   General Date of Onset: 03/20/24 HPI: Laurie Hunter is a 78 y.o. female with a past medical history of stroke in 2013 with left-sided deficits, essential hypertension, diabetes mellitus type 2, gout who lives with her sister.  Patient presented to the emergency department on 9/3 with complaints of weakness.  She was diagnosed as having UTI and was discharged on Bactrim .  During that visit she underwent CT scan of the abdomen pelvis which did not show any acute finding except for fluid levels in the colon suggesting a diarrheal illness. MRI negative for acute findings.  Nursing concerned with pocketing food during meals.  ST consulted for clinical swallow evaluation. Type of Study: Bedside Swallow Evaluation Previous Swallow Assessment: n/a Diet Prior to  this Study: Regular;Thin liquids (Level 0) Temperature Spikes Noted: No Respiratory Status: Room air History of Recent Intubation: No Behavior/Cognition: Alert;Cooperative Oral Cavity Assessment: Within Functional Limits Oral Care Completed by SLP: No Oral Cavity - Dentition:  Missing dentition Vision: Functional for self-feeding Self-Feeding Abilities: Able to feed self Patient Positioning: Upright in chair Baseline Vocal Quality: Normal Volitional Cough: Strong Volitional Swallow: Able to elicit    Oral/Motor/Sensory Function Overall Oral Motor/Sensory Function: Within functional limits   Ice Chips Ice chips: Not tested   Thin Liquid Thin Liquid: Within functional limits Presentation: Straw    Nectar Thick Nectar Thick Liquid: Not tested   Honey Thick Honey Thick Liquid: Not tested   Puree Puree: Within functional limits Presentation: Self Fed   Solid     Solid: Impaired Presentation: Self Fed Oral Phase Impairments: Impaired mastication Oral Phase Functional Implications: Impaired mastication;Prolonged oral transit Other Comments: Pt noted retention of food in L lateral sulcus      Pat Keyan Folson,M.S.,CCC-SLP 03/21/2024,2:55 PM

## 2024-03-22 DIAGNOSIS — N3000 Acute cystitis without hematuria: Secondary | ICD-10-CM | POA: Diagnosis not present

## 2024-03-22 LAB — CBC
HCT: 29 % — ABNORMAL LOW (ref 36.0–46.0)
Hemoglobin: 9.7 g/dL — ABNORMAL LOW (ref 12.0–15.0)
MCH: 25.5 pg — ABNORMAL LOW (ref 26.0–34.0)
MCHC: 33.4 g/dL (ref 30.0–36.0)
MCV: 76.3 fL — ABNORMAL LOW (ref 80.0–100.0)
Platelets: 154 K/uL (ref 150–400)
RBC: 3.8 MIL/uL — ABNORMAL LOW (ref 3.87–5.11)
RDW: 21.9 % — ABNORMAL HIGH (ref 11.5–15.5)
WBC: 5.6 K/uL (ref 4.0–10.5)
nRBC: 0.5 % — ABNORMAL HIGH (ref 0.0–0.2)

## 2024-03-22 LAB — RENAL FUNCTION PANEL
Albumin: 2.5 g/dL — ABNORMAL LOW (ref 3.5–5.0)
Anion gap: 13 (ref 5–15)
BUN: 11 mg/dL (ref 8–23)
CO2: 22 mmol/L (ref 22–32)
Calcium: 8 mg/dL — ABNORMAL LOW (ref 8.9–10.3)
Chloride: 102 mmol/L (ref 98–111)
Creatinine, Ser: 1.16 mg/dL — ABNORMAL HIGH (ref 0.44–1.00)
GFR, Estimated: 48 mL/min — ABNORMAL LOW (ref >60)
Glucose, Bld: 121 mg/dL — ABNORMAL HIGH (ref 70–99)
Phosphorus: 2.4 mg/dL — ABNORMAL LOW (ref 2.5–4.6)
Potassium: 3.6 mmol/L (ref 3.5–5.1)
Sodium: 137 mmol/L (ref 135–145)

## 2024-03-22 LAB — GLUCOSE, CAPILLARY
Glucose-Capillary: 88 mg/dL (ref 70–99)
Glucose-Capillary: 90 mg/dL (ref 70–99)
Glucose-Capillary: 120 mg/dL — ABNORMAL HIGH (ref 70–99)
Glucose-Capillary: 117 mg/dL — ABNORMAL HIGH (ref 70–99)
Glucose-Capillary: 133 mg/dL — ABNORMAL HIGH (ref 70–99)

## 2024-03-22 LAB — MAGNESIUM: Magnesium: 1.5 mg/dL — ABNORMAL LOW (ref 1.7–2.4)

## 2024-03-22 MED ORDER — HEPARIN SODIUM (PORCINE) 5000 UNIT/ML IJ SOLN
5000.0000 [IU] | Freq: Two times a day (BID) | INTRAMUSCULAR | Status: DC
  Administered 2024-03-22 – 2024-03-27 (×10): 5000 [IU] via SUBCUTANEOUS
  Filled 2024-03-22 (×10): qty 1

## 2024-03-22 MED ORDER — POTASSIUM CHLORIDE CRYS ER 20 MEQ PO TBCR
40.0000 meq | EXTENDED_RELEASE_TABLET | Freq: Once | ORAL | Status: AC
  Administered 2024-03-22: 40 meq via ORAL
  Filled 2024-03-22: qty 2

## 2024-03-22 MED ORDER — MAGNESIUM SULFATE 4 GM/100ML IV SOLN
4.0000 g | Freq: Once | INTRAVENOUS | Status: AC
  Administered 2024-03-22: 4 g via INTRAVENOUS
  Filled 2024-03-22: qty 100

## 2024-03-22 MED ORDER — HYDROXYZINE HCL 25 MG PO TABS
25.0000 mg | ORAL_TABLET | Freq: Three times a day (TID) | ORAL | Status: DC | PRN
  Administered 2024-03-24: 25 mg via ORAL
  Filled 2024-03-22: qty 1

## 2024-03-22 NOTE — Progress Notes (Signed)
 [facility] Progress Note Patient: Laurie Hunter FMW:[id number] DOB: 02-23-1946  DOA: 03/19/2024 DOS: the patient was seen and examined on 03/22/2024  Brief Hospital Course: PMH of CVA with left-sided weakness and left facial droop, HTN, type II DM, gout presents to the hospital with complaints of generalized weakness and fatigue. Currently being treated for UTI and electrolyte deficiency.  Assessment and Plan: Possible UTI. Presents with generalized weakness.  UA had some pyuria. Was started on IV antibiotic. No cultures were sent.  Completed 3 days of IV antibiotic.  Will discontinue.  AKI on CKD stage IIIb. With hypokalemia hypomagnesemia and hypocalcemia. Baseline serum creatinine 1.3.  On admission serum creatinine 2.6. improving with IV hydration.  Will hold for now for Replaced potassium replacing magnesium  and calcium .  Folate deficiency. Replacing orally. Folic acid  5.6. B12 1427. Iron  48.  Nutritional deficiency anemia. Hemoglobin stable around 10 at baseline. Currently 9.5 likely due to dilutional. Monitor.  Prior CVA.  With residual weakness and facial droop on left On aspirin  and Plavix  at home. Currently continuing. CT of the head as well as MRI brain negative for any acute stroke.  HTN. Blood pressure soft. On Norvasc , atenolol.  HCTZ currently on hold.  Gout. On colchicine  as well as allopurinol  at home. Will continue.  Chronic bilateral knee arthritis. Voltaren  gel 4 times daily.  Type 2 diabetes mellitus, un-controlled with hyperglycemia, without long-term insulin  use with CKD. Holding oral hypoglycemic agent. Currently sliding scale insulin .  Diarrhea. C. difficile ruled out.  Obesity Class 3 Body mass index is 48.73 kg/m.  Placing the pt at higher risk of poor outcomes.   Dysphagia. SLP seeing the patient. Currently on dysphagia 2 diet.   Subjective: No nausea no vomiting no fever no chills.  No chest pain.  Physical Exam: Clear  to auscultation. Bowel sound present No edema. Some oozing from the heparin  shot this morning.  Data Reviewed: I have Reviewed nursing notes, Vitals, and Lab results. Since last encounter, pertinent lab results CBC and BMP   . I have ordered test including CBC and BMP and magnesium   .   Disposition: Status is: Inpatient Remains inpatient appropriate because: Monitor for improvement in electrolytes and or intake  heparin  injection 5,000 Units Start: 03/22/24 2200 Place and maintain sequential compression device Start: 03/22/24 0921  Family Communication: No one at bedside Level of care: Med-Surg   Vitals:   03/22/24 0415 03/22/24 0728 03/22/24 0902 03/22/24 1451  BP: (!) 111/48 (S) (!) 109/50  (!) 143/78  Pulse: 77 85  89  Resp: 17 17    Temp: 98.2 F (36.8 C) 98.8 F (37.1 C)  98.6 F (37 C)  TempSrc: Oral Oral  Oral  SpO2: 98% 100%  96%  Weight:   101.9 kg   Height:   4' 8 (1.422 m)      Author: Yetta Blanch, MD 03/22/2024 6:01 PM  Please look on [email or web address] to find out who is on call.

## 2024-03-22 NOTE — NC FL2 (Signed)
 [place]  MEDICAID FL2 LEVEL OF CARE FORM     IDENTIFICATION  Patient Name: Laurie Hunter Birthdate: 06-26-1946 Sex: female Admission Date (Current Location): 03/19/2024  [facility] and [place] Number:  Producer, television/film/video and Address:  [facility] [place]. [facility], [address], [place], [place] [address]      Provider Number: [id number]  Attending Physician Name and Address:  Tobie Yetta HERO, MD  Relative Name and Phone Number:  Laurie Hunter (Sister)  [phone]    Current Level of Care: Hospital Recommended Level of Care: Skilled Nursing Facility Prior Approval Number:    Date Approved/Denied:   PASRR Number: [id number]  Discharge Plan: SNF    Current Diagnoses: Patient Active Problem List   Diagnosis Date Noted   UTI (urinary tract infection) 03/20/2024   Hypokalemia 03/20/2024   Diarrhea 10/28/2023   Acute kidney injury superimposed on chronic kidney disease (HCC) 10/28/2023   CVA (cerebral vascular accident) (HCC) 10/28/2023   Non-insulin  treated type 2 diabetes mellitus (HCC) 10/28/2023   Generalized weakness 10/28/2023   Hyperlipidemia 10/28/2023   Nonhealing pressure ulcer of the left lower extremity 10/28/2023   Anemia of chronic disease 10/28/2023   Gout 10/28/2023   AKI (acute kidney injury) (HCC) 10/28/2023   Morbid obesity with BMI of 70 and over, adult (HCC) 10/28/2023   Chronic acquired lymphedema 12/17/2013   Difficulty walking 12/17/2013    Orientation RESPIRATION BLADDER Height & Weight     Self, Time, Situation, Place  Normal Continent, External catheter Weight: 224 lb 10.4 oz (101.9 kg) Height:  4' 8 (142.2 cm)  BEHAVIORAL SYMPTOMS/MOOD NEUROLOGICAL BOWEL NUTRITION STATUS      Continent Diet (see DC summary)  AMBULATORY STATUS COMMUNICATION OF NEEDS Skin   Extensive Assist Verbally Normal                       Personal Care Assistance Level of Assistance  Bathing, Feeding, Dressing Bathing Assistance: Maximum  assistance Feeding assistance: Limited assistance Dressing Assistance: Maximum assistance     Functional Limitations Info  Sight, Hearing, Speech Sight Info: Adequate Hearing Info: Adequate Speech Info: Adequate    SPECIAL CARE FACTORS FREQUENCY  PT (By licensed PT), OT (By licensed OT)     PT Frequency: 5x/week OT Frequency: 5x/week            Contractures Contractures Info: Not present    Additional Factors Info  Code Status, Allergies Code Status Info: FULL Allergies Info: Erythromycin  Lisinopril  Penicillins           Current Medications (03/22/2024):  This is the current hospital active medication list Current Facility-Administered Medications  Medication Dose Route Frequency Provider Last Rate Last Admin   acetaminophen  (TYLENOL ) tablet 650 mg  650 mg Oral Q6H PRN Krishnan, Gokul, MD   650 mg at 03/21/24 1210   Or   acetaminophen  (TYLENOL ) suppository 650 mg  650 mg Rectal Q6H PRN Krishnan, Gokul, MD       allopurinol  (ZYLOPRIM ) tablet 100 mg  100 mg Oral Daily Patel, Pranav M, MD   100 mg at 03/21/24 1012   aspirin  EC tablet 81 mg  81 mg Oral Daily Patel, Pranav M, MD   81 mg at 03/21/24 1012   atorvastatin  (LIPITOR) tablet 20 mg  20 mg Oral QHS Patel, Pranav M, MD   20 mg at 03/21/24 2253   cefTRIAXone  (ROCEPHIN ) 1 g in sodium chloride  0.9 % 100 mL IVPB  1 g Intravenous  Q24H Krishnan, Gokul, MD   Stopping previously hung infusion at 03/22/24 0857   clopidogrel  (PLAVIX ) tablet 75 mg  75 mg Oral Daily Patel, Pranav M, MD   75 mg at 03/21/24 1012   colchicine  tablet 0.6 mg  0.6 mg Oral BID Patel, Pranav M, MD   0.6 mg at 03/21/24 2253   diclofenac  Sodium (VOLTAREN ) 1 % topical gel 2 g  2 g Topical QID Patel, Pranav M, MD   2 g at 03/21/24 2253   feeding supplement (ENSURE PLUS HIGH PROTEIN) liquid 237 mL  237 mL Oral TID BM Patel, Pranav M, MD   237 mL at 03/21/24 2254   folic acid  (FOLVITE ) tablet 1 mg  1 mg Oral Daily Patel, Pranav M, MD   1 mg at 03/21/24 1012    heparin  injection 5,000 Units  5,000 Units Subcutaneous Q12H Patel, Pranav M, MD       insulin  aspart (novoLOG ) injection 0-6 Units  0-6 Units Subcutaneous TID WC Krishnan, Gokul, MD       loperamide  (IMODIUM ) capsule 2 mg  2 mg Oral PRN Patel, Pranav M, MD   2 mg at 03/21/24 1210   magnesium  sulfate IVPB 4 g 100 mL  4 g Intravenous Once Patel, Pranav M, MD       ondansetron  (ZOFRAN ) tablet 4 mg  4 mg Oral Q6H PRN Krishnan, Gokul, MD       Or   ondansetron  (ZOFRAN ) injection 4 mg  4 mg Intravenous Q6H PRN Krishnan, Gokul, MD       potassium chloride  SA (KLOR-CON  M) CR tablet 40 mEq  40 mEq Oral Once Patel, Pranav M, MD       saccharomyces boulardii (FLORASTOR) capsule 250 mg  250 mg Oral BID Patel, Pranav M, MD   250 mg at 03/21/24 2253     Discharge Medications: Please see discharge summary for a list of discharge medications.  Relevant Imaging Results:  Relevant Lab Results:   Additional Information [id number]  [name], LCSW

## 2024-03-22 NOTE — TOC Initial Note (Signed)
 Transition of Care [facility]) - Initial/Assessment Note    Patient Details  Name: Laurie Hunter MRN: [id number] Date of Birth: [birth year]  Transition of Care [facility]) CM/SW Contact:    Laurie Lennartz A [place], LCSW Phone Number: 03/22/2024, 10:29 AM  Clinical Narrative:                  CSW made attempt to speak with pt, CSW and pt disconnected by phone and line was busy when CSW reached back out. CSW spoke with pt's sister, Laurie Hunter to assist with initial assessment. Pt is from home with her sister Laurie Hunter. CSW informed her of recommendation for short term rehab at SNF at [place].   She stated that they help each Hunter at home but recently pt's decline in ambulation, sister feels she cannot assist as easily. She said that until pt can do for herself she would prefer if pt went to short term rehab to get stronger versus going home with home health services. Pt has been to [facility] in the past and that is their preference facility.   She stated she would follow up with pt and they would discuss plan and make a decision on disposition. Agreeable for CSW to fax out for bed at [facility] in the meantime, stating likely pt would be agreeable for rehab at [place]. SNF workup completed, bed offers pending. Tentative plan for STR at [place] versus Home with Home Health.   Inpatient Case Management will continue to follow.   Expected Discharge Plan: Skilled Nursing Facility Barriers to Discharge: Continued Medical Work up, English as a second language teacher, SNF Pending bed offer   Patient Goals and CMS Choice Patient states their goals for this hospitalization and ongoing recovery are:: Spoke with pt's sister, rehab is better right now until she can do for herself CMS Medicare.gov Compare Post Acute Care list provided to:: Patient Represenative (must comment) (pt's Laurie Hunter) Choice offered to / list presented to : Patient      Expected Discharge Plan and Services In-house Referral: Clinical Social Work     Living arrangements  for the past 2 months: Single Family Home                                      Prior Living Arrangements/Services Living arrangements for the past 2 months: Single Family Home Lives with:: Siblings Patient language and need for interpreter reviewed:: No        Need for Family Participation in Patient Care: Yes (Comment) Care giver support system in place?: Yes (comment) (pt's sister Laurie Hunter)   Criminal Activity/Legal Involvement Pertinent to Current Situation/Hospitalization: No - Comment as needed  Activities of Daily Living   ADL Screening (condition at time of admission) Independently performs ADLs?: No Does the patient have a NEW difficulty with bathing/dressing/toileting/self-feeding that is expected to last >3 days?: Yes (Initiates electronic notice to provider for possible OT consult) Does the patient have a NEW difficulty with getting in/out of bed, walking, or climbing stairs that is expected to last >3 days?: Yes (Initiates electronic notice to provider for possible PT consult) Does the patient have a NEW difficulty with communication that is expected to last >3 days?: Yes (Initiates electronic notice to provider for possible SLP consult) Is the patient deaf or have difficulty hearing?: No Does the patient have difficulty seeing, even when wearing glasses/contacts?: No Does the patient have difficulty concentrating, remembering, or  making decisions?: Yes  Permission Sought/Granted                  Emotional Assessment   Attitude/Demeanor/Rapport: Unable to Assess Affect (typically observed): Unable to Assess Orientation: : Oriented to Self, Oriented to Place, Oriented to  Time, Oriented to Situation Alcohol / Substance Use: Not Applicable Psych Involvement: No (comment)  Admission diagnosis:  UTI (urinary tract infection) [N39.0] Acute cystitis without hematuria [N30.00] Fall, initial encounter [W19.XXXA] Patient Active Problem List   Diagnosis Date  Noted   UTI (urinary tract infection) 03/20/2024   Hypokalemia 03/20/2024   Diarrhea 10/28/2023   Acute kidney injury superimposed on chronic kidney disease (HCC) 10/28/2023   CVA (cerebral vascular accident) (HCC) 10/28/2023   Non-insulin  treated type 2 diabetes mellitus (HCC) 10/28/2023   Generalized weakness 10/28/2023   Hyperlipidemia 10/28/2023   Nonhealing pressure ulcer of the left lower extremity 10/28/2023   Anemia of chronic disease 10/28/2023   Gout 10/28/2023   AKI (acute kidney injury) (HCC) 10/28/2023   Morbid obesity with BMI of 70 and over, adult (HCC) 10/28/2023   Chronic acquired lymphedema 12/17/2013   Difficulty walking 12/17/2013   PCP:  Laurie Other, MD Pharmacy:   [facility] [phone] [address], [place] - [address] [place] [address] Phone: [phone] Fax: [phone]  [facility] [address] [place] [address] Phone: [phone] Fax: [phone]     Social Drivers of Health (SDOH) Social History: SDOH Screenings   Food Insecurity: No Food Insecurity (03/20/2024)  Housing: Low Risk  (03/20/2024)  Transportation Needs: Unmet Transportation Needs (03/20/2024)  Utilities: Not At Risk (03/20/2024)  Social Connections: Moderately Integrated (03/20/2024)  Tobacco Use: Unknown (03/20/2024)   SDOH Interventions:     Readmission Risk Interventions     No data to display

## 2024-03-22 NOTE — Progress Notes (Signed)
 Patient still having difficulty on dysphagia 2 diet. Requests tomato soup. This nurse called dietary and ordered two bowls of tomato soup for patient at this time. Staff states will arrive near 1555.

## 2024-03-22 NOTE — Plan of Care (Signed)
   Problem: Education: Goal: Knowledge of General Education information will improve Description: Including pain rating scale, medication(s)/side effects and non-pharmacologic comfort measures Outcome: Completed/Met

## 2024-03-22 NOTE — Plan of Care (Signed)
 Laurie Hunter did well to improve her nutrition today. He has proven that she understands the need to maintain adequate drinking, eating and monitoring of her blood sugar, as she has tended to be on the lower side in blood sugar. She was able to contact her sister (that she lives with), so this boosted her spirits. She understands the imparity of gaining strength in her legs and the ability to stand, and be mobile. She still wishes to return home with her sister, therefore she is determined to get better. This nurse did notice a very small sacral wound to [place]'s coccyx area (pictured), that was not previously noted upon admission. Dr. Tobie and Doyal (charge RN) bedside when wound was noticed. Foam placed to area and patient continued to be repositioned every 2 hours.

## 2024-03-23 DIAGNOSIS — N3000 Acute cystitis without hematuria: Secondary | ICD-10-CM | POA: Diagnosis not present

## 2024-03-23 LAB — CBC
HCT: 29.4 % — ABNORMAL LOW (ref 36.0–46.0)
Hemoglobin: 9.9 g/dL — ABNORMAL LOW (ref 12.0–15.0)
MCH: 25.8 pg — ABNORMAL LOW (ref 26.0–34.0)
MCHC: 33.7 g/dL (ref 30.0–36.0)
MCV: 76.6 fL — ABNORMAL LOW (ref 80.0–100.0)
Platelets: 151 K/uL (ref 150–400)
RBC: 3.84 MIL/uL — ABNORMAL LOW (ref 3.87–5.11)
RDW: 22.3 % — ABNORMAL HIGH (ref 11.5–15.5)
WBC: 7 K/uL (ref 4.0–10.5)
nRBC: 0.3 % — ABNORMAL HIGH (ref 0.0–0.2)

## 2024-03-23 LAB — RENAL FUNCTION PANEL
Albumin: 2.5 g/dL — ABNORMAL LOW (ref 3.5–5.0)
Anion gap: 6 (ref 5–15)
BUN: 14 mg/dL (ref 8–23)
CO2: 26 mmol/L (ref 22–32)
Calcium: 8.5 mg/dL — ABNORMAL LOW (ref 8.9–10.3)
Chloride: 105 mmol/L (ref 98–111)
Creatinine, Ser: 1.01 mg/dL — ABNORMAL HIGH (ref 0.44–1.00)
GFR, Estimated: 57 mL/min — ABNORMAL LOW (ref >60)
Glucose, Bld: 102 mg/dL — ABNORMAL HIGH (ref 70–99)
Phosphorus: 2.3 mg/dL — ABNORMAL LOW (ref 2.5–4.6)
Potassium: 4.1 mmol/L (ref 3.5–5.1)
Sodium: 137 mmol/L (ref 135–145)

## 2024-03-23 LAB — MAGNESIUM: Magnesium: 2.1 mg/dL (ref 1.7–2.4)

## 2024-03-23 LAB — GLUCOSE, CAPILLARY
Glucose-Capillary: 105 mg/dL — ABNORMAL HIGH (ref 70–99)
Glucose-Capillary: 105 mg/dL — ABNORMAL HIGH (ref 70–99)
Glucose-Capillary: 100 mg/dL — ABNORMAL HIGH (ref 70–99)
Glucose-Capillary: 113 mg/dL — ABNORMAL HIGH (ref 70–99)
Glucose-Capillary: 113 mg/dL — ABNORMAL HIGH (ref 70–99)

## 2024-03-23 MED ORDER — COLCHICINE 0.6 MG PO TABS
0.6000 mg | ORAL_TABLET | Freq: Every day | ORAL | Status: DC
  Administered 2024-03-24 – 2024-03-27 (×4): 0.6 mg via ORAL
  Filled 2024-03-23 (×4): qty 1

## 2024-03-23 MED ORDER — BOOST PLUS PO LIQD
237.0000 mL | Freq: Three times a day (TID) | ORAL | Status: DC
  Administered 2024-03-23 – 2024-03-27 (×12): 237 mL via ORAL
  Filled 2024-03-23 (×15): qty 237

## 2024-03-23 NOTE — TOC Progression Note (Addendum)
 Transition of Care [facility]) - Progression Note    Patient Details  Name: Laurie Hunter MRN: [id number] Date of Birth: 10-09-1945  Transition of Care [facility]) CM/SW Contact  Lendia Dais, [place] Phone Number: 03/23/2024, 12:39 PM  Clinical Narrative:  CSW spoke to patient at bedside about PT recs.  Patient stated that she previously went to [facility] for STR and would like to return. CSW asked for permission to send referral in the hub and pt was agreeable.  CSW spoke to [facility] and stated that they would have a bed ready for the pt. CSW will start insurance auth once a new PT note is in.  1626 - Auth pending, [name] ID is [id number]    Expected Discharge Plan: Skilled Nursing Facility Barriers to Discharge: Continued Medical Work up, English as a second language teacher, SNF Pending bed offer               Expected Discharge Plan and Services In-house Referral: Clinical Social Work     Living arrangements for the past 2 months: Single Family Home                                       Social Drivers of Health (SDOH) Interventions SDOH Screenings   Food Insecurity: No Food Insecurity (03/20/2024)  Housing: Low Risk  (03/20/2024)  Transportation Needs: Unmet Transportation Needs (03/20/2024)  Utilities: Not At Risk (03/20/2024)  Social Connections: Moderately Integrated (03/20/2024)  Tobacco Use: Unknown (03/20/2024)    Readmission Risk Interventions     No data to display

## 2024-03-23 NOTE — Progress Notes (Signed)
 Physical Therapy Treatment Patient Details Name: Laurie Hunter MRN: [id number] DOB: 14-May-1946 Today's Date: 03/23/2024   History of Present Illness Laurie Hunter is a 78 y.o. female who presented 9/3 for UTI, was treated and discharged. Pt came back 9/4 with persistent weakness and falls. MRI brain did not show any acute findings. PMHx: stroke in 2013 with left-sided deficits, essential hypertension, diabetes mellitus type 2, gout    PT Comments  Pt seen for PT tx with pt received asleep, awakened & agreeable to tx. Pt requires max assist for supine<>sit, min assist for rolling L<>R. Pt tolerates sitting EOB ~15 minutes with supervision, wobbles in all directions but no true LOB. Pt engaged in balance tasks, unsafe to attempt standing without 2nd person present. Pt educated on lateral scoots along EOB but unable to complete 2/2 weakness & decreased balance. Recommend post acute rehab <3 hours therapy/day upon d/c.    If plan is discharge home, recommend the following: Assist for transportation;Assistance with cooking/housework;Help with stairs or ramp for entrance;Two people to help with walking and/or transfers;Two people to help with bathing/dressing/bathroom   Can travel by private vehicle     No  Equipment Recommendations  Hospital bed;Hoyer lift    Recommendations for Other Services       Precautions / Restrictions Precautions Precautions: Fall Required Braces or Orthoses: Other Brace Other Brace: pt has AFO to wear in standing for stability it is new and pt does not like it. It is too large and too heavy for pt to manipulate her LLE Restrictions Weight Bearing Restrictions Per Provider Order: No     Mobility  Bed Mobility Overal bed mobility: Needs Assistance Bed Mobility: Supine to Sit, Rolling Rolling: Min assist, Used rails (roll R with min assist, roll L with supervision with bed rails)   Supine to sit: Max assist, HOB elevated, Used rails Sit to supine: Max assist    General bed mobility comments: Pt able to scoot to [facility] with bed in trendelenburg position with cuing to use RUE (assistance to place it on bed rail) & push with RLE, max assist to scoot to [facility]    Transfers Overall transfer level:  (unsafe to attempt without 2nd person)                      Ambulation/Gait                   Stairs             Wheelchair Mobility     Tilt Bed    Modified Rankin (Stroke Patients Only)       Balance Overall balance assessment: Needs assistance Sitting-balance support: Feet supported, Bilateral upper extremity supported Sitting balance-Leahy Scale: Fair Sitting balance - Comments: Pt wobbles in all directions but no true LOB                                    Communication Communication Communication: Impaired Factors Affecting Communication: Difficulty expressing self;Reduced clarity of speech  Cognition Arousal: Alert Behavior During Therapy: WFL for tasks assessed/performed   PT - Cognitive impairments: No family/caregiver present to determine baseline                         Following commands: Intact      Cueing Cueing Techniques: Verbal cues  Exercises Other Exercises Other Exercises:  Pt engaged in reaching outside of BOS with either UE with focus on core strengthening, balance challenges. Pt with limited LUE ROM. Other Exercises: Pt attempts to scoot to R to [facility] with cuing re: head/hips relationship with poor return demo & inability to scoot.    General Comments        Pertinent Vitals/Pain Pain Assessment Pain Assessment: No/denies pain    Home Living                          Prior Function            PT Goals (current goals can now be found in the care plan section) Acute Rehab PT Goals Patient Stated Goal: to get home as soon as possible PT Goal Formulation: With patient Time For Goal Achievement: 04/04/24 Potential to Achieve Goals: Fair Progress  towards PT goals: Progressing toward goals    Frequency    Min 2X/week      PT Plan      Co-evaluation              AM-PAC PT 6 Clicks Mobility   Outcome Measure  Help needed turning from your back to your side while in a flat bed without using bedrails?: A Lot Help needed moving from lying on your back to sitting on the side of a flat bed without using bedrails?: Total Help needed moving to and from a bed to a chair (including a wheelchair)?: Total Help needed standing up from a chair using your arms (e.g., wheelchair or bedside chair)?: Total Help needed to walk in hospital room?: Total Help needed climbing 3-5 steps with a railing? : Total 6 Click Score: 7    End of Session   Activity Tolerance: Patient tolerated treatment well Patient left: in bed;with call bell/phone within reach;with bed alarm set   PT Visit Diagnosis: Unsteadiness on feet (R26.81);Other abnormalities of gait and mobility (R26.89);Muscle weakness (generalized) (M62.81);Difficulty in walking, not elsewhere classified (R26.2)     Time: 1425-1450 PT Time Calculation (min) (ACUTE ONLY): 25 min  Charges:    $Therapeutic Activity: 23-37 mins PT General Charges $$ ACUTE PT VISIT: 1 Visit                     Richerd Pinal, PT, DPT 03/23/24, 3:25 PM   Richerd CHRISTELLA Pinal 03/23/2024, 3:24 PM

## 2024-03-23 NOTE — Plan of Care (Signed)
  Problem: Education: Goal: Ability to describe self-care measures that may prevent or decrease complications (Diabetes Survival Skills Education) will improve Outcome: Completed/Met Goal: Individualized Educational Video(s) Outcome: Not Applicable   

## 2024-03-23 NOTE — Progress Notes (Signed)
 [facility] Progress Note Patient: Laurie Hunter FMW:[id number] DOB: Oct 07, 1945  DOA: 03/19/2024 DOS: the patient was seen and examined on 03/23/2024  Brief Hospital Course: PMH of CVA with left-sided weakness and left facial droop, HTN, type II DM, gout presents to the hospital with complaints of generalized weakness and fatigue. Currently being treated for UTI and electrolyte deficiency.  Assessment and Plan: Possible UTI. Presents with generalized weakness.  UA had some pyuria. Was started on IV antibiotic. No cultures were sent.  Completed 3 days of IV antibiotic.   AKI on CKD stage IIIb. With hypokalemia hypomagnesemia and hypocalcemia. Baseline serum creatinine 1.3.  On admission serum creatinine 2.6. improving with IV hydration.  Replaced potassium replacing magnesium  and calcium .  Folate deficiency. Replacing orally. Folic acid  5.6. B12 1427. Iron  48.  Nutritional deficiency anemia. Hemoglobin stable around 10 at baseline. Monitor.  Prior CVA.  With residual weakness and facial droop on left On aspirin  and Plavix  at home. Currently continuing. CT of the head as well as MRI brain negative for any acute stroke.  HTN. Blood pressure soft. On Norvasc , atenolol.  HCTZ currently on hold.  Gout. On colchicine  as well as allopurinol  at home. Will continue.  Chronic bilateral knee arthritis. Voltaren  gel 4 times daily.  Type 2 diabetes mellitus, un-controlled with hyperglycemia, without long-term insulin  use with CKD. Holding oral hypoglycemic agent. Currently sliding scale insulin .  Diarrhea. C. difficile ruled out.  Obesity Class 3 Body mass index is 48.73 kg/m.  Placing the pt at higher risk of poor outcomes.   Dysphagia. SLP seeing the patient. Currently on dysphagia 2 diet.   Subjective: Reports nausea and vomiting today.  Also reports 2 episodes of diarrhea today.  Reports minimal oral intake today.  Physical Exam: Clear to auscultation. S1-S2  present.  Bowel sound present. Abdominal discomfort but no tenderness. No edema  Data Reviewed: I have Reviewed nursing notes, Vitals, and Lab results. Since last encounter, pertinent lab results CBC and BMP   . I have ordered test including CBC and BMP  .   Disposition: Status is: Inpatient Remains inpatient appropriate because: Monitor for improvement in renal function and oral intake.  Changing nutritional supplements from Ensure to boost to see if the patient will tolerate it well.  heparin  injection 5,000 Units Start: 03/22/24 2200 Place and maintain sequential compression device Start: 03/22/24 0921   Family Communication: No one at bedside Level of care: Med-Surg   Vitals:   03/22/24 2024 03/23/24 0434 03/23/24 0737 03/23/24 1602  BP: 133/68 (!) 159/74 (!) 129/58 135/77  Pulse: 90 98 94 82  Resp: 18 16 18 18   Temp: 99 F (37.2 C) 99 F (37.2 C) 98.6 F (37 C) 98.2 F (36.8 C)  TempSrc: Oral Oral Oral Oral  SpO2: 100% 96% 98% 95%  Weight:      Height:         Author: Yetta Blanch, MD 03/23/2024 6:24 PM  Please look on [email or web address] to find out who is on call.

## 2024-03-24 ENCOUNTER — Inpatient Hospital Stay (HOSPITAL_COMMUNITY)

## 2024-03-24 DIAGNOSIS — N3000 Acute cystitis without hematuria: Secondary | ICD-10-CM | POA: Diagnosis not present

## 2024-03-24 LAB — CBC
HCT: 29.1 % — ABNORMAL LOW (ref 36.0–46.0)
Hemoglobin: 9.4 g/dL — ABNORMAL LOW (ref 12.0–15.0)
MCH: 25.5 pg — ABNORMAL LOW (ref 26.0–34.0)
MCHC: 32.3 g/dL (ref 30.0–36.0)
MCV: 79.1 fL — ABNORMAL LOW (ref 80.0–100.0)
Platelets: 147 K/uL — ABNORMAL LOW (ref 150–400)
RBC: 3.68 MIL/uL — ABNORMAL LOW (ref 3.87–5.11)
RDW: 22.8 % — ABNORMAL HIGH (ref 11.5–15.5)
WBC: 6.9 K/uL (ref 4.0–10.5)
nRBC: 0.3 % — ABNORMAL HIGH (ref 0.0–0.2)

## 2024-03-24 LAB — RENAL FUNCTION PANEL
Albumin: 2.5 g/dL — ABNORMAL LOW (ref 3.5–5.0)
Anion gap: 8 (ref 5–15)
BUN: 14 mg/dL (ref 8–23)
CO2: 27 mmol/L (ref 22–32)
Calcium: 9.2 mg/dL (ref 8.9–10.3)
Chloride: 104 mmol/L (ref 98–111)
Creatinine, Ser: 1.44 mg/dL — ABNORMAL HIGH (ref 0.44–1.00)
GFR, Estimated: 37 mL/min — ABNORMAL LOW (ref >60)
Glucose, Bld: 81 mg/dL (ref 70–99)
Phosphorus: 3.5 mg/dL (ref 2.5–4.6)
Potassium: 4.6 mmol/L (ref 3.5–5.1)
Sodium: 139 mmol/L (ref 135–145)

## 2024-03-24 LAB — GLUCOSE, CAPILLARY
Glucose-Capillary: 74 mg/dL (ref 70–99)
Glucose-Capillary: 108 mg/dL — ABNORMAL HIGH (ref 70–99)
Glucose-Capillary: 126 mg/dL — ABNORMAL HIGH (ref 70–99)
Glucose-Capillary: 120 mg/dL — ABNORMAL HIGH (ref 70–99)

## 2024-03-24 LAB — MAGNESIUM: Magnesium: 2 mg/dL (ref 1.7–2.4)

## 2024-03-24 MED ORDER — SIMETHICONE 80 MG PO CHEW
80.0000 mg | CHEWABLE_TABLET | Freq: Four times a day (QID) | ORAL | Status: DC
  Administered 2024-03-24 – 2024-03-27 (×10): 80 mg via ORAL
  Filled 2024-03-24 (×13): qty 1

## 2024-03-24 MED ORDER — PANTOPRAZOLE SODIUM 40 MG IV SOLR
40.0000 mg | Freq: Once | INTRAVENOUS | Status: AC
  Administered 2024-03-24: 40 mg via INTRAVENOUS
  Filled 2024-03-24: qty 10

## 2024-03-24 MED ORDER — PANTOPRAZOLE SODIUM 40 MG PO TBEC
40.0000 mg | DELAYED_RELEASE_TABLET | Freq: Every day | ORAL | Status: DC
  Administered 2024-03-25 – 2024-03-27 (×3): 40 mg via ORAL
  Filled 2024-03-24 (×3): qty 1

## 2024-03-24 MED ORDER — SODIUM CHLORIDE 0.9 % IV SOLN: INTRAVENOUS | Status: DC

## 2024-03-24 NOTE — Progress Notes (Signed)
 Mobility Specialist Progress Note:    03/24/24 1240  Mobility  Activity Pivoted/transferred from chair to bed  Level of Assistance Moderate assist, patient does 50-74% (+2)  Assistive Device Stedy  Activity Response Tolerated well  Mobility Referral Yes  Mobility visit 1 Mobility  Mobility Specialist Start Time (ACUTE ONLY) 1112  Mobility Specialist Stop Time (ACUTE ONLY) 1123  Mobility Specialist Time Calculation (min) (ACUTE ONLY) 11 min   Pt received in chair, RN requesting assistance getting patient back to bed. No c/o throughout. Required ModA +2 for STS, transferred via stedy. Returned to supine in bed w/ call bell and personal belongings in reach. All needs met.  Thersia Minder Mobility Specialist  Please contact vis Secure Chat or  Rehab Office [phone]

## 2024-03-24 NOTE — TOC Progression Note (Signed)
 Transition of Care [facility]) - Progression Note    Patient Details  Name: Laurie Hunter MRN: [id number] Date of Birth: 1945-10-22  Transition [facility] Care [facility]) CM/SW Contact  Lendia Dais, [place] Phone Number: 03/24/2024, 2:55 PM  Clinical Narrative:  [name] has been approved for [facility] 09/09-09/11 Auth ID [id number]. Patient is not yet medically stable to discharge per MD.  CSW will continue to monitor.     Expected Discharge Plan: Skilled Nursing Facility Barriers to Discharge: Continued Medical Work up, English as a second language teacher, SNF Pending bed offer               Expected Discharge Plan and Services In-house Referral: Clinical Social Work     Living arrangements for the past 2 months: Single Family Home                                       Social Drivers of Health (SDOH) Interventions SDOH Screenings   Food Insecurity: No Food Insecurity (03/20/2024)  Housing: Low Risk  (03/20/2024)  Transportation Needs: Unmet Transportation Needs (03/20/2024)  Utilities: Not At Risk (03/20/2024)  Social Connections: Moderately Integrated (03/20/2024)  Tobacco Use: Unknown (03/20/2024)    Readmission Risk Interventions     No data to display

## 2024-03-24 NOTE — Progress Notes (Signed)
 Speech Language Pathology Treatment: Dysphagia  Patient Details Name: Laurie Hunter MRN: [id number] DOB: [birth year] Today's Date: 03/24/2024 Time: 8983-8975 SLP Time Calculation (min) (ACUTE ONLY): 8 min  Assessment / Plan / Recommendation Clinical Impression  Observation was somewhat limited by pt's lethargy today. She roused briefly to state that she liked the chopped solids on her meal trays and does not wish to upgrade yet. She achieved complete oral clearance with thin liquids and bite-size solids, exhibiting no signs clinically concerning for aspiration. Given pt's preference, recommend she continue current diet without ongoing SLP f/u acutely. Feel she can advance per her preference and RN/MD discretion.    HPI HPI: Laurie Hunter is a 78 y.o. female with a past medical history of stroke in 2013 with left-sided deficits, essential hypertension, diabetes mellitus type 2, gout who lives with her sister.  Patient presented to the emergency department on 9/3 with complaints of weakness.  She was diagnosed as having UTI and was discharged on Bactrim .  During that visit she underwent CT scan of the abdomen pelvis which did not show any acute finding except for fluid levels in the colon suggesting a diarrheal illness. MRI negative for acute findings.  Nursing concerned with pocketing food during meals.  ST consulted for clinical swallow evaluation.      SLP Plan  Continue with current plan of care          Recommendations  Diet recommendations: Dysphagia 2 (fine chop);Thin liquid Liquids provided via: Cup;Straw Medication Administration: Whole meds with liquid Supervision: Staff to assist with self feeding Compensations: Slow rate;Small sips/bites;Lingual sweep for clearance of pocketing Postural Changes and/or Swallow Maneuvers: Seated upright 90 degrees                  Oral care BID   PRN Dysphagia, unspecified (R13.10)     Continue with current plan of care     Damien Blumenthal, M.A., CCC-SLP Speech Language Pathology, Acute Rehabilitation Services  Secure Chat preferred [phone]   03/24/2024, 10:33 AM

## 2024-03-24 NOTE — Care Management Important Message (Signed)
 Important Message  Patient Details  Name: Laurie Hunter MRN: [id number] Date of Birth: [birth year]   Important Message Given:  Yes - Medicare IM     Claretta Deed 03/24/2024, 3:29 PM

## 2024-03-24 NOTE — Progress Notes (Signed)
 Occupational Therapy Treatment Patient Details Name: Laurie Hunter MRN: [id number] DOB: [birth year] Today's Date: 03/24/2024   History of present illness Laurie Hunter is a 78 y.o. female who presented 9/3 for UTI, was treated and discharged. Pt came back 9/4 with persistent weakness and falls. MRI brain did not show any acute findings. PMHx: stroke in 2013 with left-sided deficits, essential hypertension, diabetes mellitus type 2, gout   OT comments  Patient received in supine and agreeable to OT treatment. Patient instructed on bed mobility and required max assist +2 to get to EOB and bed pad used to scoot towards EOB. Patient stood from EOB into [place] with max assist +2 and had BM with loose stool.  Patient was transferred to [facility] and required increased time due to patient having loose stool on every attempt to stand for cleaning.  Patient able to tolerate transfer to recliner at end of session and was positioned for comfort.  Patient will benefit from continued inpatient follow up therapy, <3 hours/day.  Acute OT to continue to follow to address established goals to facilitate DC to next venue of care.        If plan is discharge home, recommend the following:  A lot of help with walking and/or transfers;Two people to help with walking and/or transfers;A lot of help with bathing/dressing/bathroom;Two people to help with bathing/dressing/bathroom;Assistance with cooking/housework;Assistance with feeding;Assist for transportation;Help with stairs or ramp for entrance   Equipment Recommendations  None recommended by OT    Recommendations for Other Services      Precautions / Restrictions Precautions Precautions: Fall Recall of Precautions/Restrictions: Intact Required Braces or Orthoses: Other Brace Other Brace: pt has AFO to wear in standing for stability it is new and pt does not like it. It is too large and too heavy for pt to manipulate her LLE Restrictions Weight Bearing Restrictions  Per Provider Order: No       Mobility Bed Mobility Overal bed mobility: Needs Assistance Bed Mobility: Supine to Sit     Supine to sit: Max assist, +2 for physical assistance     General bed mobility comments: max assist +2 to get to EOB with use of bed pad to scoot towards EOB    Transfers Overall transfer level: Needs assistance Equipment used: Ambulation equipment used Transfers: Sit to/from Stand, Bed to chair/wheelchair/BSC Sit to Stand: Max assist, +2 physical assistance Stand pivot transfers: Max assist, +2 physical assistance, +2 safety/equipment         General transfer comment: patient required max assist +2 to stand from EOB and BSC with use of bed pad to assist from EOB Transfer via Lift Equipment: Stedy   Balance Overall balance assessment: Needs assistance Sitting-balance support: Feet supported, Bilateral upper extremity supported Sitting balance-Leahy Scale: Fair Sitting balance - Comments: initially min assist for sitting balance on EOB and increased to CGA/supervision with time   Standing balance support: Bilateral upper extremity supported, During functional activity, Reliant on assistive device for balance Standing balance-Leahy Scale: Poor Standing balance comment: reliant on Stedy for support                           ADL either performed or assessed with clinical judgement   ADL Overall ADL's : Needs assistance/impaired     Grooming: Set up;Sitting               Lower Body Dressing: Total assistance;Bed level   Toilet Transfer: Maximal assistance;+2 for  physical assistance;BSC/3in1 Laurent) Statistician Details (indicate cue type and reason): Stedy utilized for toilet transfer to [facility] with max assist +2 for sit to stands Psychiatrist and Hygiene: Total assistance;Sit to/from stand Toileting - Clothing Manipulation Details (indicate cue type and reason): patient stood in stedy for toilet hygiene      Functional mobility during ADLs: Maximal assistance;+2 for safety/equipment;+2 for physical assistance General ADL Comments: increased time on BSC due to loose stool    Extremity/Trunk Assessment              Vision       Perception     Praxis     Communication Communication Communication: Impaired Factors Affecting Communication: Difficulty expressing self;Reduced clarity of speech   Cognition Arousal: Alert Behavior During Therapy: WFL for tasks assessed/performed Cognition: No family/caregiver present to determine baseline, No apparent impairments                               Following commands: Intact        Cueing   Cueing Techniques: Verbal cues  Exercises      Shoulder Instructions       General Comments patient with loose stool whenever she stood    Pertinent Vitals/ Pain       Pain Assessment Pain Assessment: No/denies pain Pain Intervention(s): Monitored during session  Home Living                                          Prior Functioning/Environment              Frequency  Min 2X/week        Progress Toward Goals  OT Goals(current goals can now be found in the care plan section)  Progress towards OT goals: Progressing toward goals  Acute Rehab OT Goals Patient Stated Goal: to walk again OT Goal Formulation: With patient Time For Goal Achievement: 04/03/24 Potential to Achieve Goals: Good ADL Goals Pt Will Perform Upper Body Dressing: with modified independence Pt Will Perform Lower Body Dressing: with mod assist;sit to/from stand Pt Will Transfer to Toilet: with mod assist;stand pivot transfer Additional ADL Goal #1: Pt will complete bed mobility with min A as a precursor to ADLs  Plan      Co-evaluation                 AM-PAC OT 6 Clicks Daily Activity     Outcome Measure   Help from another person eating meals?: None Help from another person taking care of personal  grooming?: A Little Help from another person toileting, which includes using toliet, bedpan, or urinal?: Total Help from another person bathing (including washing, rinsing, drying)?: Total Help from another person to put on and taking off regular upper body clothing?: A Lot Help from another person to put on and taking off regular lower body clothing?: Total 6 Click Score: 12    End of Session Equipment Utilized During Treatment: Gait belt;Other (comment) Laurent)  OT Visit Diagnosis: Unsteadiness on feet (R26.81);Other abnormalities of gait and mobility (R26.89);Muscle weakness (generalized) (M62.81);Pain;History of falling (Z91.81)   Activity Tolerance Patient tolerated treatment well   Patient Left in chair;with call bell/phone within reach;with chair alarm set   Nurse Communication Mobility status;Need for lift equipment        Time: [phone]  OT Time Calculation (min): 43 min  Charges: OT General Charges $OT Visit: 1 Visit OT Treatments $Self Care/Home Management : 38-52 mins  Dick Laine, OTA Acute Rehabilitation Services  Office [phone]   Jeb LITTIE Laine 03/24/2024, 11:58 AM

## 2024-03-24 NOTE — Progress Notes (Signed)
 [facility] Progress Note Patient: Laurie Hunter FMW:[id number] DOB: 10/04/1945  DOA: 03/19/2024 DOS: the patient was seen and examined on 03/24/2024  Brief Hospital Course: PMH of CVA with left-sided weakness and left facial droop, HTN, type II DM, gout presents to the hospital with complaints of generalized weakness and fatigue. Currently being treated for UTI and electrolyte deficiency.  Assessment and Plan: Possible UTI. Presents with generalized weakness.  UA had some pyuria. Was started on IV antibiotic. No cultures were sent.  Completed 3 days of IV antibiotic.   AKI on CKD stage IIIb. With hypokalemia hypomagnesemia and hypocalcemia. Baseline serum creatinine 1.3.  On admission serum creatinine 2.6. improving with IV hydration.  Replaced potassium magnesium  and calcium .  Folate deficiency. Replacing orally. Folic acid  5.6. B12 1427. Iron  48.  Nutritional deficiency anemia. Hemoglobin stable around 10 at baseline. Monitor.  Prior CVA.  With residual weakness and facial droop on left On aspirin  and Plavix  at home. Currently continuing. CT of the head as well as MRI brain negative for any acute stroke.  HTN. Blood pressure soft. On Norvasc , atenolol.  HCTZ currently on hold.  Gout. On colchicine  as well as allopurinol  at home. Will continue.  Chronic bilateral knee arthritis. Voltaren  gel 4 times daily.  Type 2 diabetes mellitus, un-controlled with hyperglycemia, without long-term insulin  use with CKD. Holding oral hypoglycemic agent. Currently sliding scale insulin .  Diarrhea. C. difficile ruled out.  CT abdomen shows evidence of gaseous distention of colon..  Obesity Class 3 Body mass index is 48.73 kg/m.  Placing the pt at higher risk of poor outcomes.   Dysphagia. SLP seeing the patient.  Intractable nausea and vomiting. Likely secondary to gaseous distention of the colon abdomen. Simethicone  added.  PPI added. Will repeat x-ray abdomen  tomorrow.  Currently on dysphagia 2 diet.   Subjective: Appears more fatigued today.  Also some shortness of breath.  No nausea no vomiting or diarrhea today.  Minimal oral intake today.  Passing gas.  Physical Exam: Clear to auscultation. S1-S2 present. Bowel sound present.  Nontender. Chronic edema unchanged.  Data Reviewed: I have Reviewed nursing notes, Vitals, and Lab results. Since last encounter, pertinent lab results CBC and BMP   . I have ordered test including CBC and BMP  . I have ordered imaging x-ray chest and x-ray abdomen  .   Disposition: Status is: Inpatient Remains inpatient appropriate because: Monitor for improvement in oral intake and renal function  heparin  injection 5,000 Units Start: 03/22/24 2200 Place and maintain sequential compression device Start: 03/22/24 0921  Family Communication: No 1 at bedside Level of care: Med-Surg   Vitals:   03/24/24 0445 03/24/24 0752 03/24/24 1114 03/24/24 1548  BP: (!) 123/57 (!) 144/66 (!) 126/53 138/65  Pulse: 81 88 91 95  Resp: 17 18 18 18   Temp: 98.4 F (36.9 C)     TempSrc: Oral     SpO2: 100% 92% 100% 96%  Weight:      Height:         Author: Yetta Blanch, MD 03/24/2024 7:36 PM  Please look on [email or web address] to find out who is on call.

## 2024-03-25 ENCOUNTER — Inpatient Hospital Stay (HOSPITAL_COMMUNITY)

## 2024-03-25 DIAGNOSIS — N390 Urinary tract infection, site not specified: Secondary | ICD-10-CM

## 2024-03-25 LAB — GLUCOSE, CAPILLARY
Glucose-Capillary: 100 mg/dL — ABNORMAL HIGH (ref 70–99)
Glucose-Capillary: 100 mg/dL — ABNORMAL HIGH (ref 70–99)
Glucose-Capillary: 129 mg/dL — ABNORMAL HIGH (ref 70–99)
Glucose-Capillary: 134 mg/dL — ABNORMAL HIGH (ref 70–99)

## 2024-03-25 LAB — CBC
HCT: 27.5 % — ABNORMAL LOW (ref 36.0–46.0)
Hemoglobin: 9 g/dL — ABNORMAL LOW (ref 12.0–15.0)
MCH: 26 pg (ref 26.0–34.0)
MCHC: 32.7 g/dL (ref 30.0–36.0)
MCV: 79.5 fL — ABNORMAL LOW (ref 80.0–100.0)
Platelets: 134 K/uL — ABNORMAL LOW (ref 150–400)
RBC: 3.46 MIL/uL — ABNORMAL LOW (ref 3.87–5.11)
RDW: 22.1 % — ABNORMAL HIGH (ref 11.5–15.5)
WBC: 6.9 K/uL (ref 4.0–10.5)
nRBC: 0.4 % — ABNORMAL HIGH (ref 0.0–0.2)

## 2024-03-25 LAB — RENAL FUNCTION PANEL
Albumin: 2.2 g/dL — ABNORMAL LOW (ref 3.5–5.0)
Anion gap: 11 (ref 5–15)
BUN: 15 mg/dL (ref 8–23)
CO2: 25 mmol/L (ref 22–32)
Calcium: 9.3 mg/dL (ref 8.9–10.3)
Chloride: 103 mmol/L (ref 98–111)
Creatinine, Ser: 1.17 mg/dL — ABNORMAL HIGH (ref 0.44–1.00)
GFR, Estimated: 48 mL/min — ABNORMAL LOW (ref >60)
Glucose, Bld: 103 mg/dL — ABNORMAL HIGH (ref 70–99)
Phosphorus: 4.3 mg/dL (ref 2.5–4.6)
Potassium: 4.9 mmol/L (ref 3.5–5.1)
Sodium: 139 mmol/L (ref 135–145)

## 2024-03-25 LAB — MAGNESIUM: Magnesium: 1.6 mg/dL — ABNORMAL LOW (ref 1.7–2.4)

## 2024-03-25 MED ORDER — MAGNESIUM OXIDE -MG SUPPLEMENT 400 (240 MG) MG PO TABS
800.0000 mg | ORAL_TABLET | Freq: Once | ORAL | Status: AC
  Administered 2024-03-25: 800 mg via ORAL
  Filled 2024-03-25: qty 2

## 2024-03-25 MED ORDER — BISACODYL 5 MG PO TBEC
10.0000 mg | DELAYED_RELEASE_TABLET | Freq: Every day | ORAL | Status: DC
Start: 2024-03-25 — End: 2024-03-27
  Administered 2024-03-26 – 2024-03-27 (×2): 10 mg via ORAL
  Filled 2024-03-25 (×3): qty 2

## 2024-03-25 MED ORDER — MAGNESIUM SULFATE 2 GM/50ML IV SOLN
2.0000 g | Freq: Once | INTRAVENOUS | Status: AC
  Administered 2024-03-25: 2 g via INTRAVENOUS
  Filled 2024-03-25: qty 50

## 2024-03-25 MED ORDER — SODIUM CHLORIDE 0.9 % IV SOLN: INTRAVENOUS | Status: AC

## 2024-03-25 NOTE — Progress Notes (Signed)
 PROGRESS NOTE    Laurie Hunter  FMW:[id number] DOB: [birth year] DOA: 03/19/2024 PCP: Ransom Other, MD    Brief Narrative:  78 year old with history of folic acid  deficiency, prior CVA with residual left-sided weakness and facial droop, HTN, gout, DM2 presented to the hospital due to generalized weakness and fatigue.  Patient was found to have urinary tract infection and AKI.  Started receiving IV antibiotics and fluids.  Completed 3 days of IV antibiotic.  Currently waiting on p.o. intake to improve   Assessment & Plan:  Nausea and vomiting secondary gaseous distention in the stomach - Diet as tolerated.  Supportive care.  Mobilize patient, conservative management.  Repeat abdominal x-ray this morning  Possible UTI. No cultures were sent, completed 3 days of IV Rocephin  on 9/6.   AKI on CKD stage IIIb. With hypokalemia hypomagnesemia and hypocalcemia. Baseline serum creatinine 1.3.  On admission serum creatinine 2.6. Creatinine is now back to baseline of 1.2.   Folate deficiency. Supplements  Hypomagnesemia - Repletion   Anemia of chronic disease Hemoglobin is stable   Prior CVA.  With residual weakness and facial droop on left Continue aspirin , Plavix , statin CT of the head as well as MRI brain negative for any acute stroke.   HTN. Resume home medications   Gout. Continue colchicine  and allopurinol    Chronic bilateral knee arthritis. Voltaren  gel 4 times daily.   Type 2 diabetes mellitus, un-controlled with hyperglycemia, without long-term insulin  use with CKD. Sliding scale and Accu-Cheks   Diarrhea.  Resolved C. difficile ruled out.  CT abdomen shows evidence of gaseous distention of colon..   Obesity Class 3 Body mass index is 48.73 kg/m.  Placing the pt at higher risk of poor outcomes.    Dysphagia. Seen by speech, currently on dysphagia 2 diet     DVT prophylaxis: heparin  injection 5,000 Units Start: 03/22/24 2200 Place and maintain sequential  compression device Start: 03/22/24 9078      Code Status: Full Code Family Communication:   Status is: Inpatient Remains inpatient appropriate because: Ongoing management for nausea   PT Follow up Recs: Skilled Nursing-Short Term Rehab (<3 Hours/Day)03/23/2024 1523  Subjective:  Seen at bedside.  Feeling slightly better this morning after taking Zofran .  Examination:  General exam: Appears calm and comfortable  Respiratory system: Clear to auscultation. Respiratory effort normal. Cardiovascular system: S1 & S2 heard, RRR. No JVD, murmurs, rubs, gallops or clicks. No pedal edema. Gastrointestinal system: Abdomen is nondistended, soft and nontender. No organomegaly or masses felt. Normal bowel sounds heard. Central nervous system: Alert and oriented. No focal neurological deficits. Extremities: Symmetric 5 x 5 power. Skin: No rashes, lesions or ulcers Psychiatry: Judgement and insight appear normal. Mood & affect appropriate.                Diet Orders (From admission, onward)     Start     Ordered   03/21/24 1432  DIET DYS 2 Fluid consistency: Thin  Diet effective now       Question:  Fluid consistency:  Answer:  Thin   03/21/24 1432            Objective: Vitals:   03/24/24 1114 03/24/24 1548 03/24/24 2011 03/25/24 0359  BP: (!) 126/53 138/65 (!) 142/60 (!) 149/73  Pulse: 91 95 85 65  Resp: 18 18 17 17   Temp:   98.2 F (36.8 C) 98 F (36.7 C)  TempSrc:   Oral Oral  SpO2: 100% 96% 99% 97%  Weight:  Height:        Intake/Output Summary (Last 24 hours) at 03/25/2024 1228 Last data filed at 03/25/2024 1125 Gross per 24 hour  Intake 1228.63 ml  Output 1650 ml  Net -421.37 ml   Filed Weights   03/20/24 1239 03/22/24 0902  Weight: 98.6 kg 101.9 kg    Scheduled Meds:  allopurinol   100 mg Oral Daily   aspirin  EC  81 mg Oral Daily   atorvastatin   20 mg Oral QHS   clopidogrel   75 mg Oral Daily   colchicine   0.6 mg Oral Daily   diclofenac  Sodium   2 g Topical QID   folic acid   1 mg Oral Daily   heparin  injection (subcutaneous)  5,000 Units Subcutaneous Q12H   insulin  aspart  0-6 Units Subcutaneous TID WC   lactose free nutrition  237 mL Oral TID WC   pantoprazole   40 mg Oral Daily   saccharomyces boulardii  250 mg Oral BID   simethicone   80 mg Oral QID   Continuous Infusions:  sodium chloride  50 mL/hr at 03/25/24 1107    Nutritional status     Body mass index is 50.37 kg/m.  Data Reviewed:   CBC: Recent Labs  Lab 03/18/24 1300 03/18/24 1309 03/19/24 2151 03/21/24 0455 03/22/24 0850 03/23/24 0550 03/24/24 0510 03/25/24 0317  WBC 6.0  --  6.5 4.8 5.6 7.0 6.9 6.9  NEUTROABS 4.2  --  4.5  --   --   --   --   --   HGB 10.7*   < > 11.5* 9.5* 9.7* 9.9* 9.4* 9.0*  HCT 33.1*   < > 35.0* 28.3* 29.0* 29.4* 29.1* 27.5*  MCV 79.0*  --  77.6* 76.3* 76.3* 76.6* 79.1* 79.5*  PLT 171  --  173 146* 154 151 147* 134*   < > = values in this interval not displayed.   Basic Metabolic Panel: Recent Labs  Lab 03/21/24 0455 03/22/24 0850 03/23/24 0550 03/24/24 0510 03/25/24 0317  NA 137 137 137 139 139  K 3.0* 3.6 4.1 4.6 4.9  CL 103 102 105 104 103  CO2 24 22 26 27 25   GLUCOSE 75 121* 102* 81 103*  BUN 13 11 14 14 15   CREATININE 1.49* 1.16* 1.01* 1.44* 1.17*  CALCIUM  7.5* 8.0* 8.5* 9.2 9.3  MG 0.8* 1.5* 2.1 2.0 1.6*  PHOS  --  2.4* 2.3* 3.5 4.3   GFR: Estimated Creatinine Clearance: 39.1 mL/min (A) (by C-G formula based on SCr of 1.17 mg/dL (H)). Liver Function Tests: Recent Labs  Lab 03/18/24 1300 03/21/24 0455 03/22/24 0850 03/23/24 0550 03/24/24 0510 03/25/24 0317  AST 64* 43*  --   --   --   --   ALT 38 32  --   --   --   --   ALKPHOS 107 105  --   --   --   --   BILITOT 0.9 1.0  --   --   --   --   PROT 6.4* 5.3*  --   --   --   --   ALBUMIN 3.1* 2.6* 2.5* 2.5* 2.5* 2.2*   Recent Labs  Lab 03/18/24 1300  LIPASE 21   No results for input(s): AMMONIA in the last 168 hours. Coagulation  Profile: No results for input(s): INR, PROTIME in the last 168 hours. Cardiac Enzymes: No results for input(s): CKTOTAL, CKMB, CKMBINDEX, TROPONINI in the last 168 hours. BNP (last 3 results) No results for input(s): PROBNP  in the last 8760 hours. HbA1C: No results for input(s): HGBA1C in the last 72 hours. CBG: Recent Labs  Lab 03/24/24 1148 03/24/24 1720 03/24/24 2010 03/25/24 0814 03/25/24 1139  GLUCAP 108* 126* 120* 100* 100*   Lipid Profile: No results for input(s): CHOL, HDL, LDLCALC, TRIG, CHOLHDL, LDLDIRECT in the last 72 hours. Thyroid  Function Tests: No results for input(s): TSH, T4TOTAL, FREET4, T3FREE, THYROIDAB in the last 72 hours. Anemia Panel: No results for input(s): VITAMINB12, FOLATE, FERRITIN, TIBC, IRON , RETICCTPCT in the last 72 hours. Sepsis Labs: Recent Labs  Lab 03/18/24 1309  LATICACIDVEN 1.7    Recent Results (from the past 240 hours)  C Difficile Quick Screen w PCR reflex     Status: None   Collection Time: 03/20/24 12:35 PM   Specimen: STOOL  Result Value Ref Range Status   C Diff antigen NEGATIVE NEGATIVE Final   C Diff toxin NEGATIVE NEGATIVE Final   C Diff interpretation No C. difficile detected.  Final    Comment: Performed at [facility], [address], [place], [place] [address]         Radiology Studies: DG CHEST PORT 1 VIEW Result Date: 03/24/2024 CLINICAL DATA:  Short of breath EXAM: PORTABLE CHEST 1 VIEW COMPARISON:  03/25/2006 FINDINGS: Single frontal view of the chest demonstrates an unremarkable cardiac silhouette. No acute airspace disease, effusion, or pneumothorax. No acute bony abnormalities. Moderate gaseous distention of the stomach. IMPRESSION: 1. No acute intrathoracic process. 2. Gaseous distension of the stomach. Electronically Signed   By: Ozell Daring M.D.   On: 03/24/2024 17:22           LOS: 4 days   Time spent= 35 mins    Burgess JAYSON Dare,  MD [facility]  If 7PM-7AM, please contact night-coverage  03/25/2024, 12:28 PM

## 2024-03-25 NOTE — Progress Notes (Signed)
 PT Cancellation Note  Patient Details Name: Laurie Hunter MRN: [id number] DOB: 05/17/1946   Cancelled Treatment:    Reason Eval/Treat Not Completed: Patient at procedure or test/unavailable. Pt down for xray.   Rodgers ORN [facility] 03/25/2024, 9:55 AM Rodgers Opal PT Acute [employer] [phone]

## 2024-03-26 ENCOUNTER — Inpatient Hospital Stay (HOSPITAL_COMMUNITY)

## 2024-03-26 DIAGNOSIS — N3000 Acute cystitis without hematuria: Secondary | ICD-10-CM | POA: Diagnosis not present

## 2024-03-26 LAB — CBC
HCT: 28.8 % — ABNORMAL LOW (ref 36.0–46.0)
Hemoglobin: 9.6 g/dL — ABNORMAL LOW (ref 12.0–15.0)
MCH: 25.8 pg — ABNORMAL LOW (ref 26.0–34.0)
MCHC: 33.3 g/dL (ref 30.0–36.0)
MCV: 77.4 fL — ABNORMAL LOW (ref 80.0–100.0)
Platelets: 146 K/uL — ABNORMAL LOW (ref 150–400)
RBC: 3.72 MIL/uL — ABNORMAL LOW (ref 3.87–5.11)
RDW: 22.8 % — ABNORMAL HIGH (ref 11.5–15.5)
WBC: 6.5 K/uL (ref 4.0–10.5)
nRBC: 0 % (ref 0.0–0.2)

## 2024-03-26 LAB — RENAL FUNCTION PANEL
Albumin: 2.4 g/dL — ABNORMAL LOW (ref 3.5–5.0)
Anion gap: 9 (ref 5–15)
BUN: 12 mg/dL (ref 8–23)
CO2: 25 mmol/L (ref 22–32)
Calcium: 9.4 mg/dL (ref 8.9–10.3)
Chloride: 102 mmol/L (ref 98–111)
Creatinine, Ser: 0.92 mg/dL (ref 0.44–1.00)
GFR, Estimated: >60 mL/min (ref >60)
Glucose, Bld: 110 mg/dL — ABNORMAL HIGH (ref 70–99)
Phosphorus: 3.9 mg/dL (ref 2.5–4.6)
Potassium: 4.7 mmol/L (ref 3.5–5.1)
Sodium: 136 mmol/L (ref 135–145)

## 2024-03-26 LAB — GLUCOSE, CAPILLARY
Glucose-Capillary: 120 mg/dL — ABNORMAL HIGH (ref 70–99)
Glucose-Capillary: 139 mg/dL — ABNORMAL HIGH (ref 70–99)
Glucose-Capillary: 144 mg/dL — ABNORMAL HIGH (ref 70–99)
Glucose-Capillary: 117 mg/dL — ABNORMAL HIGH (ref 70–99)

## 2024-03-26 LAB — MAGNESIUM: Magnesium: 1.6 mg/dL — ABNORMAL LOW (ref 1.7–2.4)

## 2024-03-26 MED ORDER — IRON SUCROSE 400 MG IVPB - SIMPLE MED
400.0000 mg | Freq: Once | Status: DC
  Filled 2024-03-26: qty 270

## 2024-03-26 MED ORDER — MAGNESIUM SULFATE 2 GM/50ML IV SOLN
2.0000 g | Freq: Once | INTRAVENOUS | Status: AC
  Administered 2024-03-26: 2 g via INTRAVENOUS
  Filled 2024-03-26: qty 50

## 2024-03-26 MED ORDER — SODIUM CHLORIDE 0.9 % IV SOLN
400.0000 mg | Freq: Once | INTRAVENOUS | Status: AC
  Administered 2024-03-26: 400 mg via INTRAVENOUS
  Filled 2024-03-26: qty 20

## 2024-03-26 NOTE — Progress Notes (Addendum)
 Physical Therapy Treatment Patient Details Name: Laurie Hunter MRN: [id number] DOB: 1945/12/17 Today's Date: 03/26/2024   History of Present Illness Laurie Hunter is a 78 y.o. female who presented 9/3 for UTI, was treated and discharged. Pt came back 9/4 with persistent weakness and falls. MRI brain did not show any acute findings. PMHx: stroke in 2013 with left-sided deficits, essential hypertension, diabetes mellitus type 2, gout    PT Comments  Pt reports nausea and diarrhea; received Zofran  this AM and is agreeable to participate with PT. Pt is very pleasant and cooperative. PT donned shoes and L AFO in supine position. Requiring +2 assist for bed mobility. Worked on Contractor. Pt partially stood with lift equipment x 4 trials for functional LE strengthening and power. Fair activity tolerance throughout. Patient will benefit from continued inpatient follow up therapy, <3 hours/day.   If plan is discharge home, recommend the following: Assist for transportation;Assistance with cooking/housework;Help with stairs or ramp for entrance;Two people to help with walking and/or transfers;Two people to help with bathing/dressing/bathroom   Can travel by private vehicle     No  Equipment Recommendations  Hospital bed;Hoyer lift    Recommendations for Other Services       Precautions / Restrictions Precautions Precautions: Fall Recall of Precautions/Restrictions: Intact Other Brace: L AFO Restrictions Weight Bearing Restrictions Per Provider Order: No     Mobility  Bed Mobility Overal bed mobility: Needs Assistance Bed Mobility: Supine to Sit, Sit to Supine     Supine to sit: Max assist, +2 for physical assistance Sit to supine: Total assist, +2 for physical assistance   General bed mobility comments: Pt making effort to move BLE's to edge of bed, use of bed pad to pivot hips to edge of bed. Increased assist to return to supine as pt was sliding     Transfers Overall transfer level: Needs assistance Equipment used: Ambulation equipment used Transfers: Sit to/from Stand Sit to Stand: Mod assist, +2 physical assistance           General transfer comment: ModA + 2 to pull to stand from Stedy x 4, pt with good effort and lifting buttocks off bed but unable to achieve full upright standing position    Ambulation/Gait                   Stairs             Wheelchair Mobility     Tilt Bed    Modified Rankin (Stroke Patients Only)       Balance Overall balance assessment: Needs assistance Sitting-balance support: Feet supported Sitting balance-Leahy Scale: Fair                                      Hotel manager: Impaired Factors Affecting Communication: Difficulty expressing self;Reduced clarity of speech  Cognition Arousal: Alert Behavior During Therapy: WFL for tasks assessed/performed   PT - Cognitive impairments: No family/caregiver present to determine baseline                       PT - Cognition Comments: Very pleasant Following commands: Intact      Cueing Cueing Techniques: Verbal cues  Exercises      General Comments        Pertinent Vitals/Pain Pain Assessment Pain Assessment: No/denies pain    Home Living  Prior Function            PT Goals (current goals can now be found in the care plan section) Acute Rehab PT Goals Patient Stated Goal: to get home as soon as possible Potential to Achieve Goals: Fair Progress towards PT goals: Progressing toward goals    Frequency    Min 2X/week      PT Plan      Co-evaluation              AM-PAC PT 6 Clicks Mobility   Outcome Measure  Help needed turning from your back to your side while in a flat bed without using bedrails?: A Lot Help needed moving from lying on your back to sitting on the side of a flat bed without using  bedrails?: Total Help needed moving to and from a bed to a chair (including a wheelchair)?: Total Help needed standing up from a chair using your arms (e.g., wheelchair or bedside chair)?: Total Help needed to walk in hospital room?: Total Help needed climbing 3-5 steps with a railing? : Total 6 Click Score: 7    End of Session Equipment Utilized During Treatment: Gait belt Activity Tolerance: Patient tolerated treatment well Patient left: in bed;with call bell/phone within reach Nurse Communication: Mobility status;Other (comment) (IV occluded) PT Visit Diagnosis: Unsteadiness on feet (R26.81);Other abnormalities of gait and mobility (R26.89);Muscle weakness (generalized) (M62.81);Difficulty in walking, not elsewhere classified (R26.2)     Time: 8994-8971 PT Time Calculation (min) (ACUTE ONLY): 23 min  Charges:    $Therapeutic Activity: 23-37 mins PT General Charges $$ ACUTE PT VISIT: 1 Visit                     Aleck Daring, PT, DPT Acute Rehabilitation Services Office [phone]    Aleck ONEIDA Daring 03/26/2024, 11:25 AM

## 2024-03-26 NOTE — Progress Notes (Addendum)
 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  Patient was found to have urinary tract infection and AKI.  Started receiving IV antibiotics and fluids.  Completed 3 days of IV antibiotic.  Currently waiting on p.o. intake to improve   Assessment & Plan:  Nausea and vomiting secondary gaseous distention in the stomach - Diet as tolerated.  Supportive care.  Mobilize patient, conservative management.  Repeat x-ray shows moderate stool burden.  Started on schedule bowel regimen. -Diarrhea is intermittent.  But nausea is persistent.  Will order CT abdomen pelvis with contrast  Possible UTI. No cultures were sent, completed 3 days of IV Rocephin  on 9/6.   AKI on CKD stage IIIb. Baseline serum creatinine 1.0.  On admission serum creatinine 2.6. Renal function now at baseline   Folate deficiency. Supplements  Hypomagnesemia - Repletion   Anemia of chronic disease with microcytosis Hemoglobin is stable Low ferritin, will order IV iron  due to intolerance to PO   Prior CVA.  With residual weakness and facial droop on left Continue aspirin , Plavix , statin CT of the head as well as MRI brain negative for any acute stroke.   HTN. Resume home medications   Gout. Continue colchicine  and allopurinol    Chronic bilateral knee arthritis. Voltaren  gel 4 times daily.   Type 2 diabetes mellitus, un-controlled with hyperglycemia, without long-term insulin  use with CKD. Sliding scale and Accu-Cheks      Obesity Class 3 Body mass index is 48.73 kg/m.  Placing the pt at higher risk of poor outcomes.    Dysphagia. Seen by speech, currently on dysphagia 2 diet     DVT prophylaxis: heparin  injection 5,000 Units  Start: 03/22/24 2200 Place and maintain sequential compression device Start: 03/22/24 9078      Code Status: Full Code Family Communication:  Called Sister [name] Status is: Inpatient Remains inpatient appropriate because: Ongoing management for nausea   PT Follow up Recs: Skilled Nursing-Short Term Rehab (<3 Hours/Day)03/23/2024 1523  Subjective: Persistent nausea with intermittent vomiting.  Also having diarrhea  Examination:  General exam: Appears calm and comfortable  Respiratory system: Clear to auscultation. Respiratory effort normal. Cardiovascular system: S1 & S2 heard, RRR. No JVD, murmurs, rubs, gallops or clicks. No pedal edema. Gastrointestinal system: Abdomen is nondistended, soft and nontender. No organomegaly or masses felt. Normal bowel sounds heard. Central nervous system: Alert and oriented. No focal neurological deficits. Extremities: Symmetric 5 x 5 power. Skin: No rashes, lesions or ulcers Psychiatry: Judgement and insight appear normal. Mood & affect appropriate.                Diet Orders (From admission, onward)     Start     Ordered   03/21/24 1432  DIET DYS 2 Fluid consistency: Thin  Diet effective now       Question:  Fluid consistency:  Answer:  Thin   03/21/24 1432            Objective: Vitals:   03/25/24 1807 03/25/24 2053 03/26/24 0410 03/26/24 0726  BP: (!) 160/73 135/87 (!) 119/59 (!) 166/61  Pulse: 90 99 91 91  Resp: 18 16 18 18   Temp: 97.6 F (36.4 C) 99.2 F (37.3 C) 99.4 F (37.4 C) 98.6  F (37 C)  TempSrc:  Oral Oral Oral  SpO2: 96% 100% 99% 100%  Weight:      Height:        Intake/Output Summary (Last 24 hours) at 03/26/2024 0956 Last data filed at 03/26/2024 0720 Gross per 24 hour  Intake 1740.41 ml  Output 1700 ml  Net 40.41 ml   Filed Weights   03/20/24 1239 03/22/24 0902  Weight: 98.6 kg 101.9 kg    Scheduled Meds:  allopurinol   100 mg Oral Daily   aspirin  EC  81 mg Oral Daily   atorvastatin   20  mg Oral QHS   bisacodyl   10 mg Oral Q1500   clopidogrel   75 mg Oral Daily   colchicine   0.6 mg Oral Daily   diclofenac  Sodium  2 g Topical QID   folic acid   1 mg Oral Daily   heparin  injection (subcutaneous)  5,000 Units Subcutaneous Q12H   insulin  aspart  0-6 Units Subcutaneous TID WC   lactose free nutrition  237 mL Oral TID WC   pantoprazole   40 mg Oral Daily   saccharomyces boulardii  250 mg Oral BID   simethicone   80 mg Oral QID   Continuous Infusions:  iron  sucrose     magnesium  sulfate bolus IVPB 2 g (03/26/24 0925)    Nutritional status     Body mass index is 50.37 kg/m.  Data Reviewed:   CBC: Recent Labs  Lab 03/19/24 2151 03/21/24 0455 03/22/24 0850 03/23/24 0550 03/24/24 0510 03/25/24 0317 03/26/24 0444  WBC 6.5   < > 5.6 7.0 6.9 6.9 6.5  NEUTROABS 4.5  --   --   --   --   --   --   HGB 11.5*   < > 9.7* 9.9* 9.4* 9.0* 9.6*  HCT 35.0*   < > 29.0* 29.4* 29.1* 27.5* 28.8*  MCV 77.6*   < > 76.3* 76.6* 79.1* 79.5* 77.4*  PLT 173   < > 154 151 147* 134* 146*   < > = values in this interval not displayed.   Basic Metabolic Panel: Recent Labs  Lab 03/22/24 0850 03/23/24 0550 03/24/24 0510 03/25/24 0317 03/26/24 0444  NA 137 137 139 139 136  K 3.6 4.1 4.6 4.9 4.7  CL 102 105 104 103 102  CO2 22 26 27 25 25   GLUCOSE 121* 102* 81 103* 110*  BUN 11 14 14 15 12   CREATININE 1.16* 1.01* 1.44* 1.17* 0.92  CALCIUM  8.0* 8.5* 9.2 9.3 9.4  MG 1.5* 2.1 2.0 1.6* 1.6*  PHOS 2.4* 2.3* 3.5 4.3 3.9   GFR: Estimated Creatinine Clearance: 49.7 mL/min (by C-G formula based on SCr of 0.92 mg/dL). Liver Function Tests: Recent Labs  Lab 03/21/24 0455 03/22/24 0850 03/23/24 0550 03/24/24 0510 03/25/24 0317 03/26/24 0444  AST 43*  --   --   --   --   --   ALT 32  --   --   --   --   --   ALKPHOS 105  --   --   --   --   --   BILITOT 1.0  --   --   --   --   --   PROT 5.3*  --   --   --   --   --   ALBUMIN 2.6* 2.5* 2.5* 2.5* 2.2* 2.4*   No results for  input(s): LIPASE, AMYLASE in the last 168 hours. No results for input(s): AMMONIA in the last 168  hours. Coagulation Profile: No results for input(s): INR, PROTIME in the last 168 hours. Cardiac Enzymes: No results for input(s): CKTOTAL, CKMB, CKMBINDEX, TROPONINI in the last 168 hours. BNP (last 3 results) No results for input(s): PROBNP in the last 8760 hours. HbA1C: No results for input(s): HGBA1C in the last 72 hours. CBG: Recent Labs  Lab 03/25/24 0814 03/25/24 1139 03/25/24 1809 03/25/24 2056 03/26/24 0720  GLUCAP 100* 100* 129* 134* 120*   Lipid Profile: No results for input(s): CHOL, HDL, LDLCALC, TRIG, CHOLHDL, LDLDIRECT in the last 72 hours. Thyroid  Function Tests: No results for input(s): TSH, T4TOTAL, FREET4, T3FREE, THYROIDAB in the last 72 hours. Anemia Panel: No results for input(s): VITAMINB12, FOLATE, FERRITIN, TIBC, IRON , RETICCTPCT in the last 72 hours. Sepsis Labs: No results for input(s): PROCALCITON, LATICACIDVEN in the last 168 hours.  Recent Results (from the past 240 hours)  C Difficile Quick Screen w PCR reflex     Status: None   Collection Time: 03/20/24 12:35 PM   Specimen: STOOL  Result Value Ref Range Status   C Diff antigen NEGATIVE NEGATIVE Final   C Diff toxin NEGATIVE NEGATIVE Final   C Diff interpretation No C. difficile detected.  Final    Comment: Performed at [facility], [address], [place], [place] [address]         Radiology Studies: DG Abd 2 Views Result Date: 03/25/2024 CLINICAL DATA:  Gastric distention. EXAM: ABDOMEN - 2 VIEW COMPARISON:  March 18, 2024. FINDINGS: No abnormal bowel dilatation. Moderate amount of stool seen throughout the colon. Status post cholecystectomy. IMPRESSION: Moderate stool burden. No abnormal bowel dilatation. Electronically Signed   By: Lynwood Landy Raddle M.D.   On: 03/25/2024 13:38   DG CHEST PORT 1 VIEW Result Date:  03/24/2024 CLINICAL DATA:  Short of breath EXAM: PORTABLE CHEST 1 VIEW COMPARISON:  03/25/2006 FINDINGS: Single frontal view of the chest demonstrates an unremarkable cardiac silhouette. No acute airspace disease, effusion, or pneumothorax. No acute bony abnormalities. Moderate gaseous distention of the stomach. IMPRESSION: 1. No acute intrathoracic process. 2. Gaseous distension of the stomach. Electronically Signed   By: Ozell Daring M.D.   On: 03/24/2024 17:22           LOS: 5 days   Time spent= 35 mins    Burgess JAYSON Dare, MD [facility]  If 7PM-7AM, please contact night-coverage  03/26/2024, 9:56 AM

## 2024-03-27 DIAGNOSIS — R799 Abnormal finding of blood chemistry, unspecified: Secondary | ICD-10-CM | POA: Diagnosis not present

## 2024-03-27 DIAGNOSIS — M1A9XX0 Chronic gout, unspecified, without tophus (tophi): Secondary | ICD-10-CM | POA: Diagnosis not present

## 2024-03-27 DIAGNOSIS — I890 Lymphedema, not elsewhere classified: Secondary | ICD-10-CM | POA: Diagnosis not present

## 2024-03-27 DIAGNOSIS — I639 Cerebral infarction, unspecified: Secondary | ICD-10-CM | POA: Diagnosis not present

## 2024-03-27 DIAGNOSIS — R404 Transient alteration of awareness: Secondary | ICD-10-CM | POA: Diagnosis not present

## 2024-03-27 DIAGNOSIS — E876 Hypokalemia: Secondary | ICD-10-CM | POA: Diagnosis not present

## 2024-03-27 DIAGNOSIS — E1122 Type 2 diabetes mellitus with diabetic chronic kidney disease: Secondary | ICD-10-CM | POA: Diagnosis not present

## 2024-03-27 DIAGNOSIS — I213 ST elevation (STEMI) myocardial infarction of unspecified site: Secondary | ICD-10-CM | POA: Diagnosis not present

## 2024-03-27 DIAGNOSIS — M170 Bilateral primary osteoarthritis of knee: Secondary | ICD-10-CM | POA: Diagnosis not present

## 2024-03-27 DIAGNOSIS — G894 Chronic pain syndrome: Secondary | ICD-10-CM | POA: Diagnosis not present

## 2024-03-27 DIAGNOSIS — R4182 Altered mental status, unspecified: Secondary | ICD-10-CM | POA: Diagnosis not present

## 2024-03-27 DIAGNOSIS — F32A Depression, unspecified: Secondary | ICD-10-CM | POA: Diagnosis not present

## 2024-03-27 DIAGNOSIS — Z87440 Personal history of urinary (tract) infections: Secondary | ICD-10-CM | POA: Diagnosis not present

## 2024-03-27 DIAGNOSIS — I69391 Dysphagia following cerebral infarction: Secondary | ICD-10-CM | POA: Diagnosis not present

## 2024-03-27 DIAGNOSIS — N1831 Chronic kidney disease, stage 3a: Secondary | ICD-10-CM | POA: Diagnosis not present

## 2024-03-27 DIAGNOSIS — E611 Iron deficiency: Secondary | ICD-10-CM | POA: Diagnosis not present

## 2024-03-27 DIAGNOSIS — F0390 Unspecified dementia without behavioral disturbance: Secondary | ICD-10-CM | POA: Diagnosis not present

## 2024-03-27 DIAGNOSIS — K219 Gastro-esophageal reflux disease without esophagitis: Secondary | ICD-10-CM | POA: Diagnosis not present

## 2024-03-27 DIAGNOSIS — R531 Weakness: Secondary | ICD-10-CM | POA: Diagnosis not present

## 2024-03-27 DIAGNOSIS — Z7984 Long term (current) use of oral hypoglycemic drugs: Secondary | ICD-10-CM | POA: Diagnosis not present

## 2024-03-27 DIAGNOSIS — R0600 Dyspnea, unspecified: Secondary | ICD-10-CM | POA: Diagnosis not present

## 2024-03-27 DIAGNOSIS — I6930 Unspecified sequelae of cerebral infarction: Secondary | ICD-10-CM | POA: Diagnosis not present

## 2024-03-27 DIAGNOSIS — E119 Type 2 diabetes mellitus without complications: Secondary | ICD-10-CM | POA: Diagnosis not present

## 2024-03-27 DIAGNOSIS — Z7982 Long term (current) use of aspirin: Secondary | ICD-10-CM | POA: Diagnosis not present

## 2024-03-27 DIAGNOSIS — N179 Acute kidney failure, unspecified: Secondary | ICD-10-CM | POA: Diagnosis not present

## 2024-03-27 DIAGNOSIS — D529 Folate deficiency anemia, unspecified: Secondary | ICD-10-CM | POA: Diagnosis not present

## 2024-03-27 DIAGNOSIS — R001 Bradycardia, unspecified: Secondary | ICD-10-CM | POA: Diagnosis not present

## 2024-03-27 DIAGNOSIS — R1311 Dysphagia, oral phase: Secondary | ICD-10-CM | POA: Diagnosis not present

## 2024-03-27 DIAGNOSIS — R140 Abdominal distension (gaseous): Secondary | ICD-10-CM | POA: Diagnosis not present

## 2024-03-27 DIAGNOSIS — L22 Diaper dermatitis: Secondary | ICD-10-CM | POA: Diagnosis not present

## 2024-03-27 DIAGNOSIS — D638 Anemia in other chronic diseases classified elsewhere: Secondary | ICD-10-CM | POA: Diagnosis not present

## 2024-03-27 DIAGNOSIS — E782 Mixed hyperlipidemia: Secondary | ICD-10-CM | POA: Diagnosis not present

## 2024-03-27 DIAGNOSIS — G459 Transient cerebral ischemic attack, unspecified: Secondary | ICD-10-CM | POA: Diagnosis not present

## 2024-03-27 DIAGNOSIS — D631 Anemia in chronic kidney disease: Secondary | ICD-10-CM | POA: Diagnosis not present

## 2024-03-27 DIAGNOSIS — R296 Repeated falls: Secondary | ICD-10-CM | POA: Diagnosis not present

## 2024-03-27 DIAGNOSIS — E8809 Other disorders of plasma-protein metabolism, not elsewhere classified: Secondary | ICD-10-CM | POA: Diagnosis not present

## 2024-03-27 DIAGNOSIS — I69354 Hemiplegia and hemiparesis following cerebral infarction affecting left non-dominant side: Secondary | ICD-10-CM | POA: Diagnosis not present

## 2024-03-27 DIAGNOSIS — Z79899 Other long term (current) drug therapy: Secondary | ICD-10-CM | POA: Diagnosis not present

## 2024-03-27 DIAGNOSIS — K429 Umbilical hernia without obstruction or gangrene: Secondary | ICD-10-CM | POA: Diagnosis not present

## 2024-03-27 DIAGNOSIS — M810 Age-related osteoporosis without current pathological fracture: Secondary | ICD-10-CM | POA: Diagnosis not present

## 2024-03-27 DIAGNOSIS — I10 Essential (primary) hypertension: Secondary | ICD-10-CM | POA: Diagnosis not present

## 2024-03-27 DIAGNOSIS — R7889 Finding of other specified substances, not normally found in blood: Secondary | ICD-10-CM | POA: Diagnosis not present

## 2024-03-27 DIAGNOSIS — Z743 Need for continuous supervision: Secondary | ICD-10-CM | POA: Diagnosis not present

## 2024-03-27 DIAGNOSIS — F419 Anxiety disorder, unspecified: Secondary | ICD-10-CM | POA: Diagnosis not present

## 2024-03-27 DIAGNOSIS — R899 Unspecified abnormal finding in specimens from other organs, systems and tissues: Secondary | ICD-10-CM | POA: Diagnosis present

## 2024-03-27 DIAGNOSIS — R5383 Other fatigue: Secondary | ICD-10-CM | POA: Diagnosis not present

## 2024-03-27 DIAGNOSIS — U071 COVID-19: Secondary | ICD-10-CM | POA: Diagnosis not present

## 2024-03-27 DIAGNOSIS — N189 Chronic kidney disease, unspecified: Secondary | ICD-10-CM | POA: Diagnosis not present

## 2024-03-27 DIAGNOSIS — N182 Chronic kidney disease, stage 2 (mild): Secondary | ICD-10-CM | POA: Diagnosis not present

## 2024-03-27 DIAGNOSIS — L89152 Pressure ulcer of sacral region, stage 2: Secondary | ICD-10-CM | POA: Diagnosis not present

## 2024-03-27 DIAGNOSIS — N1832 Chronic kidney disease, stage 3b: Secondary | ICD-10-CM | POA: Diagnosis not present

## 2024-03-27 DIAGNOSIS — D509 Iron deficiency anemia, unspecified: Secondary | ICD-10-CM | POA: Diagnosis not present

## 2024-03-27 DIAGNOSIS — I129 Hypertensive chronic kidney disease with stage 1 through stage 4 chronic kidney disease, or unspecified chronic kidney disease: Secondary | ICD-10-CM | POA: Diagnosis not present

## 2024-03-27 DIAGNOSIS — M6281 Muscle weakness (generalized): Secondary | ICD-10-CM | POA: Diagnosis not present

## 2024-03-27 DIAGNOSIS — R41841 Cognitive communication deficit: Secondary | ICD-10-CM | POA: Diagnosis not present

## 2024-03-27 DIAGNOSIS — Z6838 Body mass index (BMI) 38.0-38.9, adult: Secondary | ICD-10-CM | POA: Diagnosis not present

## 2024-03-27 DIAGNOSIS — N342 Other urethritis: Secondary | ICD-10-CM | POA: Diagnosis not present

## 2024-03-27 DIAGNOSIS — Z8616 Personal history of COVID-19: Secondary | ICD-10-CM | POA: Diagnosis not present

## 2024-03-27 DIAGNOSIS — E1140 Type 2 diabetes mellitus with diabetic neuropathy, unspecified: Secondary | ICD-10-CM | POA: Diagnosis not present

## 2024-03-27 DIAGNOSIS — Z7409 Other reduced mobility: Secondary | ICD-10-CM | POA: Diagnosis not present

## 2024-03-27 DIAGNOSIS — N390 Urinary tract infection, site not specified: Secondary | ICD-10-CM | POA: Diagnosis not present

## 2024-03-27 DIAGNOSIS — G4700 Insomnia, unspecified: Secondary | ICD-10-CM | POA: Diagnosis not present

## 2024-03-27 LAB — CBC
HCT: 27.5 % — ABNORMAL LOW (ref 36.0–46.0)
Hemoglobin: 9 g/dL — ABNORMAL LOW (ref 12.0–15.0)
MCH: 25.4 pg — ABNORMAL LOW (ref 26.0–34.0)
MCHC: 32.7 g/dL (ref 30.0–36.0)
MCV: 77.7 fL — ABNORMAL LOW (ref 80.0–100.0)
Platelets: 143 K/uL — ABNORMAL LOW (ref 150–400)
RBC: 3.54 MIL/uL — ABNORMAL LOW (ref 3.87–5.11)
RDW: 22.6 % — ABNORMAL HIGH (ref 11.5–15.5)
WBC: 6.2 K/uL (ref 4.0–10.5)
nRBC: 0.3 % — ABNORMAL HIGH (ref 0.0–0.2)

## 2024-03-27 LAB — RENAL FUNCTION PANEL
Albumin: 2.3 g/dL — ABNORMAL LOW (ref 3.5–5.0)
Anion gap: 7 (ref 5–15)
BUN: 12 mg/dL (ref 8–23)
CO2: 27 mmol/L (ref 22–32)
Calcium: 9 mg/dL (ref 8.9–10.3)
Chloride: 101 mmol/L (ref 98–111)
Creatinine, Ser: 1.05 mg/dL — ABNORMAL HIGH (ref 0.44–1.00)
GFR, Estimated: 54 mL/min — ABNORMAL LOW (ref >60)
Glucose, Bld: 109 mg/dL — ABNORMAL HIGH (ref 70–99)
Phosphorus: 4.5 mg/dL (ref 2.5–4.6)
Potassium: 4.7 mmol/L (ref 3.5–5.1)
Sodium: 135 mmol/L (ref 135–145)

## 2024-03-27 LAB — GLUCOSE, CAPILLARY
Glucose-Capillary: 101 mg/dL — ABNORMAL HIGH (ref 70–99)
Glucose-Capillary: 123 mg/dL — ABNORMAL HIGH (ref 70–99)
Glucose-Capillary: 114 mg/dL — ABNORMAL HIGH (ref 70–99)

## 2024-03-27 MED ORDER — METOCLOPRAMIDE HCL 5 MG PO TABS
5.0000 mg | ORAL_TABLET | Freq: Three times a day (TID) | ORAL | Status: DC
  Administered 2024-03-27 (×3): 5 mg via ORAL
  Filled 2024-03-27 (×3): qty 1

## 2024-03-27 MED ORDER — SIMETHICONE 80 MG PO CHEW: 80.0000 mg | CHEWABLE_TABLET | Freq: Four times a day (QID) | ORAL | Status: AC | PRN

## 2024-03-27 MED ORDER — METOCLOPRAMIDE HCL 5 MG PO TABS: 5.0000 mg | ORAL_TABLET | Freq: Three times a day (TID) | ORAL | Status: AC

## 2024-03-27 MED ORDER — SACCHAROMYCES BOULARDII 250 MG PO CAPS: 250.0000 mg | ORAL_CAPSULE | Freq: Two times a day (BID) | ORAL | Status: AC

## 2024-03-27 MED ORDER — FOLIC ACID 1 MG PO TABS: 1.0000 mg | ORAL_TABLET | Freq: Every day | ORAL | Status: AC

## 2024-03-27 MED ORDER — PANTOPRAZOLE SODIUM 40 MG PO TBEC: 40.0000 mg | DELAYED_RELEASE_TABLET | Freq: Every day | ORAL | Status: AC

## 2024-03-27 MED ORDER — BISACODYL 5 MG PO TBEC: 10.0000 mg | DELAYED_RELEASE_TABLET | Freq: Every day | ORAL | Status: AC

## 2024-03-27 NOTE — Plan of Care (Signed)
  Problem: Metabolic: Goal: Ability to maintain appropriate glucose levels will improve Outcome: Progressing   Problem: Nutritional: Goal: Maintenance of adequate nutrition will improve Outcome: Progressing Goal: Progress toward achieving an optimal weight will improve Outcome: Progressing   Problem: Skin Integrity: Goal: Risk for impaired skin integrity will decrease Outcome: Progressing   Problem: Tissue Perfusion: Goal: Adequacy of tissue perfusion will improve Outcome: Progressing   Problem: Health Behavior/Discharge Planning: Goal: Ability to manage health-related needs will improve Outcome: Progressing   Problem: Clinical Measurements: Goal: Ability to maintain clinical measurements within normal limits will improve Outcome: Progressing Goal: Will remain free from infection Outcome: Progressing Goal: Diagnostic test results will improve Outcome: Progressing Goal: Respiratory complications will improve Outcome: Progressing Goal: Cardiovascular complication will be avoided Outcome: Progressing   Problem: Activity: Goal: Risk for activity intolerance will decrease Outcome: Progressing   Problem: Nutrition: Goal: Adequate nutrition will be maintained Outcome: Progressing   Problem: Coping: Goal: Level of anxiety will decrease Outcome: Progressing   Problem: Elimination: Goal: Will not experience complications related to bowel motility Outcome: Progressing Goal: Will not experience complications related to urinary retention Outcome: Progressing   Problem: Pain Managment: Goal: General experience of comfort will improve and/or be controlled Outcome: Progressing   Problem: Safety: Goal: Ability to remain free from injury will improve Outcome: Progressing   Problem: Skin Integrity: Goal: Risk for impaired skin integrity will decrease Outcome: Progressing

## 2024-03-27 NOTE — Progress Notes (Signed)
 Attempted to call report to [facility] without success  Laurie Hunter

## 2024-03-27 NOTE — Progress Notes (Signed)
 DISCHARGE NOTE HOME Laurie Hunter Sar to be discharged Skilled nursing facility per MD order. Discussed prescriptions and follow up appointments with the patient. medication list explained in detail. Patient verbalized understanding.  Skin clean, dry and intact without evidence of skin break down, no evidence of skin tears noted. IV catheter discontinued intact. Site without signs and symptoms of complications. Dressing and pressure applied. Pt denies pain at the site currently. No complaints noted.  Patient free of lines, drains, and wounds.   An After Visit Summary (AVS) was printed and given PTAR. Report called to [facility] Patient escorted via wheelchair, and discharged home via ambulance.  Doyal Sias, RN

## 2024-03-27 NOTE — Progress Notes (Signed)
 Mobility Specialist Progress Note:    03/27/24 1308  Mobility  Activity Pivoted/transferred from chair to bed;Pivoted/transferred to/from [facility]  Level of Assistance Moderate assist, patient does 50-74% (+2)  Assistive Device Stedy  Activity Response Tolerated well  Mobility Referral Yes  Mobility visit 1 Mobility  Mobility Specialist Start Time (ACUTE ONLY) 1038  Mobility Specialist Stop Time (ACUTE ONLY) 1057  Mobility Specialist Time Calculation (min) (ACUTE ONLY) 19 min   Pt received in chair agreeable to mobility. No c/o throughout. Requested to use BSC before getting back in bed. Pt required ModA +2 to stand from chair and BSC level; contact guard from stedy level. Returned to bed w/o fault. Left in bed w/ call bell and personal belongings in reach. All needs met.   Thersia Minder Mobility Specialist  Please contact vis Secure Chat or  Rehab Office [phone]

## 2024-03-27 NOTE — Progress Notes (Signed)
 Occupational Therapy Treatment Patient Details Name: Laurie Hunter MRN: [id number] DOB: 1946-02-23 Today's Date: 03/27/2024   History of present illness Laurie Hunter is a 78 y.o. female who presented 9/3 for UTI, was treated and discharged. Pt came back 9/4 with persistent weakness and falls. MRI brain did not show any acute findings. PMHx: stroke in 2013 with left-sided deficits, essential hypertension, diabetes mellitus type 2, gout   OT comments  Patient demonstrating gains with bed mobility and sit to stands. Patient participated in transfers to [facility] and to recliner with less assistance to stand from [place] pads.  Patient will benefit from continued inpatient follow up therapy, <3 hours/day. Acute OT to continue to follow to address established goals to facilitate DC to next venue of care.        If plan is discharge home, recommend the following:  A lot of help with walking and/or transfers;Two people to help with walking and/or transfers;A lot of help with bathing/dressing/bathroom;Two people to help with bathing/dressing/bathroom;Assistance with cooking/housework;Assistance with feeding;Assist for transportation;Help with stairs or ramp for entrance   Equipment Recommendations  None recommended by OT    Recommendations for Other Services      Precautions / Restrictions Precautions Precautions: Fall Recall of Precautions/Restrictions: Intact Required Braces or Orthoses: Other Brace Other Brace: L AFO Restrictions Weight Bearing Restrictions Per Provider Order: No       Mobility Bed Mobility Overal bed mobility: Needs Assistance Bed Mobility: Supine to Sit     Supine to sit: Mod assist, +2 for physical assistance     General bed mobility comments: bedpad utilized to assist with scooting to EOB    Transfers Overall transfer level: Needs assistance Equipment used: Ambulation equipment used Transfers: Sit to/from Stand, Bed to chair/wheelchair/BSC Sit to Stand: Mod  assist, +2 physical assistance           General transfer comment: mod assist +2 to stand fromEOB and BSC but min assist +2 from [employer] pads Transfer via Lift Equipment: Stedy   Balance Overall balance assessment: Needs assistance Sitting-balance support: Feet supported Sitting balance-Leahy Scale: Fair     Standing balance support: Bilateral upper extremity supported, During functional activity, Reliant on assistive device for balance Standing balance-Leahy Scale: Poor Standing balance comment: reliant on Stedy for support                           ADL either performed or assessed with clinical judgement   ADL Overall ADL's : Needs assistance/impaired     Grooming: Set up;Sitting       Lower Body Bathing: Total assistance       Lower Body Dressing: Total assistance;Sitting/lateral leans Lower Body Dressing Details (indicate cue type and reason): to donn shoes Toilet Transfer: Moderate assistance;+2 for physical assistance;BSC/3in1 (with Stedy)             General ADL Comments: transfer to [facility] with Stedy but patient did not have BM.    Extremity/Trunk Assessment              Diplomatic Services operational officer Communication Communication: Impaired Factors Affecting Communication: Difficulty expressing self;Reduced clarity of speech   Cognition Arousal: Alert Behavior During Therapy: WFL for tasks assessed/performed Cognition: No family/caregiver present to determine baseline, No apparent impairments  Following commands: Intact        Cueing   Cueing Techniques: Verbal cues  Exercises      Shoulder Instructions       General Comments      Pertinent Vitals/ Pain       Pain Assessment Pain Assessment: Faces Faces Pain Scale: Hurts little more Pain Location: R LE Pain Descriptors / Indicators: Discomfort, Grimacing Pain Intervention(s): Limited activity within  patient's tolerance, Monitored during session, Repositioned  Home Living                                          Prior Functioning/Environment              Frequency  Min 2X/week        Progress Toward Goals  OT Goals(current goals can now be found in the care plan section)  Progress towards OT goals: Progressing toward goals  Acute Rehab OT Goals Patient Stated Goal: to walk again OT Goal Formulation: With patient Time For Goal Achievement: 04/03/24 Potential to Achieve Goals: Good ADL Goals Pt Will Perform Upper Body Dressing: with modified independence Pt Will Perform Lower Body Dressing: with mod assist;sit to/from stand Pt Will Transfer to Toilet: with mod assist;stand pivot transfer Additional ADL Goal #1: Pt will complete bed mobility with min A as a precursor to ADLs  Plan      Co-evaluation                 AM-PAC OT 6 Clicks Daily Activity     Outcome Measure   Help from another person eating meals?: None Help from another person taking care of personal grooming?: A Little Help from another person toileting, which includes using toliet, bedpan, or urinal?: Total Help from another person bathing (including washing, rinsing, drying)?: Total Help from another person to put on and taking off regular upper body clothing?: A Lot Help from another person to put on and taking off regular lower body clothing?: Total 6 Click Score: 12    End of Session Equipment Utilized During Treatment: Gait belt;Other (comment) Laurent)  OT Visit Diagnosis: Unsteadiness on feet (R26.81);Other abnormalities of gait and mobility (R26.89);Muscle weakness (generalized) (M62.81);Pain;History of falling (Z91.81) Pain - Right/Left: Right Pain - part of body: Leg   Activity Tolerance Patient tolerated treatment well   Patient Left in chair;with call bell/phone within reach;with chair alarm set   Nurse Communication Mobility status;Need for lift  equipment        Time: 9096-9063 OT Time Calculation (min): 33 min  Charges: OT General Charges $OT Visit: 1 Visit OT Treatments $Self Care/Home Management : 23-37 mins  Dick Laine, OTA Acute Rehabilitation Services  Office [phone]   Jeb LITTIE Laine 03/27/2024, 12:50 PM

## 2024-03-27 NOTE — Progress Notes (Signed)
 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  Patient was found to have urinary tract infection and AKI.  Started receiving IV antibiotics and fluids.  Completed 3 days of IV antibiotic.  Currently waiting on p.o. intake to improve   Assessment & Plan:  Nausea and vomiting secondary gaseous distention in the stomach - Diet as tolerated.  Supportive care.  Mobilize patient, conservative management.  Repeat x-ray shows moderate stool burden.  Started on schedule bowel regimen. - CT abdomen pelvis does not show any acute pathology besides notable volume in the stomach and gas.  Will add scheduled Reglan   Possible UTI. No cultures were sent, completed 3 days of IV Rocephin  on 9/6.   AKI on CKD stage IIIb. Baseline serum creatinine 1.0.  On admission serum creatinine 2.6. Renal function now at baseline   Folate deficiency. Supplements  Hypomagnesemia - Repletion   Anemia of chronic disease with microcytosis Hemoglobin is stable Low ferritin, will order IV iron  due to intolerance to PO   Prior CVA.  With residual weakness and facial droop on left Continue aspirin , Plavix , statin CT of the head as well as MRI brain negative for any acute stroke.   HTN. Resume home medications   Gout. Continue colchicine  and allopurinol    Chronic bilateral knee arthritis. Voltaren  gel 4 times daily.   Type 2 diabetes mellitus, un-controlled with hyperglycemia, without long-term insulin  use with CKD. Sliding scale and Accu-Cheks      Obesity Class 3 Body mass index is 48.73 kg/m.  Placing the pt at higher risk of poor outcomes.    Dysphagia. Seen by speech, currently on dysphagia 2 diet     DVT prophylaxis:  heparin  injection 5,000 Units Start: 03/22/24 2200 Place and maintain sequential compression device Start: 03/22/24 9078      Code Status: Full Code Family Communication:  Called Sister [name] Status is: Inpatient Remains inpatient appropriate because: Ongoing management for nausea   PT Follow up Recs: Skilled Nursing-Short Term Rehab (<3 Hours/Day)03/23/2024 1523  Subjective: Nausea is better. Feeling ok.   Examination:  General exam: Appears calm and comfortable  Respiratory system: Clear to auscultation. Respiratory effort normal. Cardiovascular system: S1 & S2 heard, RRR. No JVD, murmurs, rubs, gallops or clicks. No pedal edema. Gastrointestinal system: Abdomen is nondistended, soft and nontender. No organomegaly or masses felt. Normal bowel sounds heard. Central nervous system: Alert and oriented. No focal neurological deficits. Extremities: Symmetric 5 x 5 power. Skin: No rashes, lesions or ulcers Psychiatry: Judgement and insight appear normal. Mood & affect appropriate.                Diet Orders (From admission, onward)     Start     Ordered   03/21/24 1432  DIET DYS 2 Fluid consistency: Thin  Diet effective now       Question:  Fluid consistency:  Answer:  Thin   03/21/24 1432            Objective: Vitals:   03/26/24 1603 03/26/24 2033 03/27/24 0504 03/27/24 0803  BP: (!) 152/76 (!) 153/68 (!) 138/58 123/65  Pulse: 86 85 89 89  Resp: 18 16 17 18   Temp: 98.7 F (37.1 C) 98.5 F (36.9 C) 97.7  F (36.5 C) 98.7 F (37.1 C)  TempSrc: Oral Oral Oral   SpO2: 100% 97% 94% 94%  Weight:      Height:        Intake/Output Summary (Last 24 hours) at 03/27/2024 1104 Last data filed at 03/27/2024 0800 Gross per 24 hour  Intake 300 ml  Output 950 ml  Net -650 ml   Filed Weights   03/20/24 1239 03/22/24 0902  Weight: 98.6 kg 101.9 kg    Scheduled Meds:  allopurinol   100 mg Oral Daily   aspirin  EC  81 mg Oral Daily   atorvastatin   20 mg Oral QHS    bisacodyl   10 mg Oral Q1500   clopidogrel   75 mg Oral Daily   colchicine   0.6 mg Oral Daily   diclofenac  Sodium  2 g Topical QID   folic acid   1 mg Oral Daily   heparin  injection (subcutaneous)  5,000 Units Subcutaneous Q12H   insulin  aspart  0-6 Units Subcutaneous TID WC   lactose free nutrition  237 mL Oral TID WC   metoCLOPramide   5 mg Oral TID AC   pantoprazole   40 mg Oral Daily   saccharomyces boulardii  250 mg Oral BID   simethicone   80 mg Oral QID   Continuous Infusions:  Nutritional status     Body mass index is 50.37 kg/m.  Data Reviewed:   CBC: Recent Labs  Lab 03/23/24 0550 03/24/24 0510 03/25/24 0317 03/26/24 0444 03/27/24 0554  WBC 7.0 6.9 6.9 6.5 6.2  HGB 9.9* 9.4* 9.0* 9.6* 9.0*  HCT 29.4* 29.1* 27.5* 28.8* 27.5*  MCV 76.6* 79.1* 79.5* 77.4* 77.7*  PLT 151 147* 134* 146* 143*   Basic Metabolic Panel: Recent Labs  Lab 03/22/24 0850 03/23/24 0550 03/24/24 0510 03/25/24 0317 03/26/24 0444 03/27/24 0554  NA 137 137 139 139 136 135  K 3.6 4.1 4.6 4.9 4.7 4.7  CL 102 105 104 103 102 101  CO2 22 26 27 25 25 27   GLUCOSE 121* 102* 81 103* 110* 109*  BUN 11 14 14 15 12 12   CREATININE 1.16* 1.01* 1.44* 1.17* 0.92 1.05*  CALCIUM  8.0* 8.5* 9.2 9.3 9.4 9.0  MG 1.5* 2.1 2.0 1.6* 1.6*  --   PHOS 2.4* 2.3* 3.5 4.3 3.9 4.5   GFR: Estimated Creatinine Clearance: 43.6 mL/min (A) (by C-G formula based on SCr of 1.05 mg/dL (H)). Liver Function Tests: Recent Labs  Lab 03/21/24 0455 03/22/24 0850 03/23/24 0550 03/24/24 0510 03/25/24 0317 03/26/24 0444 03/27/24 0554  AST 43*  --   --   --   --   --   --   ALT 32  --   --   --   --   --   --   ALKPHOS 105  --   --   --   --   --   --   BILITOT 1.0  --   --   --   --   --   --   PROT 5.3*  --   --   --   --   --   --   ALBUMIN 2.6*   < > 2.5* 2.5* 2.2* 2.4* 2.3*   < > = values in this interval not displayed.   No results for input(s): LIPASE, AMYLASE in the last 168 hours. No results for  input(s): AMMONIA in the last 168 hours. Coagulation Profile: No results for input(s): INR, PROTIME in the last 168 hours. Cardiac Enzymes: No  results for input(s): CKTOTAL, CKMB, CKMBINDEX, TROPONINI in the last 168 hours. BNP (last 3 results) No results for input(s): PROBNP in the last 8760 hours. HbA1C: No results for input(s): HGBA1C in the last 72 hours. CBG: Recent Labs  Lab 03/26/24 0720 03/26/24 1130 03/26/24 1600 03/26/24 2156 03/27/24 0759  GLUCAP 120* 139* 144* 117* 101*   Lipid Profile: No results for input(s): CHOL, HDL, LDLCALC, TRIG, CHOLHDL, LDLDIRECT in the last 72 hours. Thyroid  Function Tests: No results for input(s): TSH, T4TOTAL, FREET4, T3FREE, THYROIDAB in the last 72 hours. Anemia Panel: No results for input(s): VITAMINB12, FOLATE, FERRITIN, TIBC, IRON , RETICCTPCT in the last 72 hours. Sepsis Labs: No results for input(s): PROCALCITON, LATICACIDVEN in the last 168 hours.  Recent Results (from the past 240 hours)  C Difficile Quick Screen w PCR reflex     Status: None   Collection Time: 03/20/24 12:35 PM   Specimen: STOOL  Result Value Ref Range Status   C Diff antigen NEGATIVE NEGATIVE Final   C Diff toxin NEGATIVE NEGATIVE Final   C Diff interpretation No C. difficile detected.  Final    Comment: Performed at [facility], [address] [place], [place] [address]         Radiology Studies: CT ABDOMEN PELVIS WO CONTRAST Result Date: 03/26/2024 CLINICAL DATA:  Acute abdominal pain EXAM: CT ABDOMEN AND PELVIS WITHOUT CONTRAST TECHNIQUE: Multidetector CT imaging of the abdomen and pelvis was performed following the standard protocol without IV contrast. RADIATION DOSE REDUCTION: This exam was performed according to the departmental dose-optimization program which includes automated exposure control, adjustment of the mA and/or kV according to patient size and/or use of iterative  reconstruction technique. COMPARISON:  03/18/2024 FINDINGS: Lower chest: Descending thoracic aortic and coronary atherosclerosis. Mild scarring or subsegmental atelectasis in the posterior basal segment left lower lobe. Hepatobiliary: Cholecystectomy.  Otherwise unremarkable. Pancreas: Unremarkable Spleen: Unremarkable Adrenals/Urinary Tract: Fluid density lesion in the right mid to lower kidney measuring 1.1 cm in diameter on image 27 series 3, most compatible with a cyst. Similar fluid density lesion in the left kidney lower pole posteriorly. No further imaging workup of these lesions is indicated. Stomach/Bowel: Notable volume of fluid and some gas in the stomach. Scattered diverticula of the descending and sigmoid colon. No dilated bowel. Vascular/Lymphatic: Atherosclerosis is present, including aortoiliac atherosclerotic disease. Reproductive: Uterus absent. Other: No supplemental non-categorized findings. Musculoskeletal: Umbilical hernia contains adipose tissue. Bridging spurring of the right sacroiliac joint. Anterior interbody bridging spurring in the lower thoracic spine. Grade 1 degenerative anterolisthesis at L4-5. IMPRESSION: 1. A specific cause for the patient's acute abdominal pain is not identified. 2. Notable volume of fluid and some gas in the stomach. 3. Scattered diverticula of the descending and sigmoid colon, without findings of diverticulitis. 4. Umbilical hernia contains adipose tissue. 5. Grade 1 degenerative anterolisthesis at L4-5. 6.  Aortic Atherosclerosis (ICD10-I70.0). Electronically Signed   By: Ryan Salvage M.D.   On: 03/26/2024 13:55           LOS: 6 days   Time spent= 35 mins    Burgess JAYSON Dare, MD [facility]  If 7PM-7AM, please contact night-coverage  03/27/2024, 11:04 AM

## 2024-03-27 NOTE — Discharge Summary (Signed)
 Physician Discharge Summary  Laurie Hunter FMW:[id number] DOB: April 17, 1946 DOA: 03/19/2024  PCP: Ransom Other, MD  Admit date: 03/19/2024 Discharge date: 03/27/2024  Admitted From: SNF Disposition: SNF  Recommendations for Outpatient Follow-up:  Follow up with PCP in 1-2 weeks Please obtain BMP/CBC in one week your next doctors visit.  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  Patient was found to have urinary tract infection and AKI.  Started receiving IV antibiotics and fluids.  Completed 3 days of IV antibiotic.  Eventually due to persistent nausea and vomiting CT abdomen pelvis was done which showed mild gas in her stomach with some notable fluid/volume.  She was started on scheduled Reglan .  Also required bowel regimen due to constipation. Today tolerating p.o.  Will discharge her in stable condition.   Assessment & Plan:  Nausea and vomiting secondary gaseous distention in the stomach - Diet as tolerated.  Supportive care.  Mobilize patient, conservative management.  Repeat x-ray shows moderate stool burden.  Started on schedule bowel regimen. - CT abdomen pelvis does not show any acute pathology besides notable volume in the stomach and gas.  Will add scheduled Reglan   Possible UTI. No cultures were sent, completed 3 days of IV Rocephin  on 9/6.   AKI on CKD stage IIIb. Baseline serum creatinine 1.0.  On admission serum creatinine 2.6. Renal function now at baseline   Folate deficiency. Supplements  Hypomagnesemia - Repletion   Anemia of chronic disease with  microcytosis Received IV iron  during the hospitalization due to low ferritin   Prior CVA.  With residual weakness and facial droop on left Continue aspirin , Plavix , statin CT of the head as well as MRI brain negative for any acute stroke.   HTN. Resume home medications   Gout. Continue colchicine  and allopurinol    Chronic bilateral knee arthritis. Voltaren  gel 4 times daily.   Type 2 diabetes mellitus, un-controlled with hyperglycemia, without long-term insulin  use with CKD. Sliding scale and Accu-Cheks Resume home regimen    Obesity Class 3 Body mass index is 48.73 kg/m.  Placing the pt at higher risk of poor outcomes.    Dysphagia. Seen by speech, currently on dysphagia 2 diet     DVT prophylaxis: heparin  injection 5,000 Units Start: 03/22/24 2200 Place and maintain sequential compression device Start: 03/22/24 9078      Code Status: Full Code Family Communication:  Called Sister [name] Status is: Inpatient Remains inpatient appropriate because: SNF placement   PT Follow up Recs: Skilled Nursing-Short Term Rehab (<3 Hours/Day)03/23/2024 1523  Subjective: Nausea has resolved, tolerating p.o.  Had a large bowel movement this morning X2  Examination:  General exam: Appears calm and comfortable  Respiratory system: Clear to auscultation. Respiratory effort normal. Cardiovascular system: S1 & S2 heard, RRR. No JVD, murmurs, rubs, gallops or clicks. No pedal edema. Gastrointestinal system: Abdomen is nondistended, soft and nontender. No organomegaly or masses felt. Normal bowel sounds heard. Central nervous system: Alert and oriented. No focal neurological deficits. Extremities: Symmetric 5 x 5 power. Skin: No rashes, lesions or ulcers Psychiatry: Judgement and insight appear normal. Mood & affect appropriate.    Discharge Diagnoses:  Principal  Problem:   UTI (urinary tract infection) Active Problems:   Acute kidney injury superimposed on chronic kidney  disease (HCC)   Generalized weakness   Chronic acquired lymphedema   Non-insulin  treated type 2 diabetes mellitus (HCC)   Hypokalemia      Discharge Exam: Vitals:   03/27/24 0504 03/27/24 0803  BP: (!) 138/58 123/65  Pulse: 89 89  Resp: 17 18  Temp: 97.7 F (36.5 C) 98.7 F (37.1 C)  SpO2: 94% 94%   Vitals:   03/26/24 1603 03/26/24 2033 03/27/24 0504 03/27/24 0803  BP: (!) 152/76 (!) 153/68 (!) 138/58 123/65  Pulse: 86 85 89 89  Resp: 18 16 17 18   Temp: 98.7 F (37.1 C) 98.5 F (36.9 C) 97.7 F (36.5 C) 98.7 F (37.1 C)  TempSrc: Oral Oral Oral   SpO2: 100% 97% 94% 94%  Weight:      Height:          Discharge Instructions   Allergies as of 03/27/2024       Reactions   Erythromycin Swelling   Lisinopril Swelling   Penicillins Swelling        Medication List     STOP taking these medications    IMODIUM  PO   sulfamethoxazole -trimethoprim  800-160 MG tablet Commonly known as: BACTRIM  DS       TAKE these medications    acetaminophen  500 MG tablet Commonly known as: TYLENOL  Take 500-1,000 mg by mouth every 6 (six) hours as needed for mild pain (pain score 1-3) or moderate pain (pain score 4-6).   allopurinol  100 MG tablet Commonly known as: ZYLOPRIM  Take 100 mg by mouth in the morning.   amLODipine  10 MG tablet Commonly known as: NORVASC  Take 10 mg by mouth at bedtime.   aspirin  81 MG tablet Take 81 mg by mouth in the morning.   atenolol 50 MG tablet Commonly known as: TENORMIN Take 50 mg by mouth in the morning.   atorvastatin  20 MG tablet Commonly known as: LIPITOR Take 20 mg by mouth at bedtime.   bisacodyl  5 MG EC tablet Commonly known as: DULCOLAX Take 2 tablets (10 mg total) by mouth daily in the afternoon.   bismuth  subsalicylate 262 MG chewable tablet Commonly known as: PEPTO BISMOL Chew 524 mg by mouth as needed for indigestion or diarrhea or loose stools.   KAOPECTATE PO Take 1-2 tablets by mouth as needed.    carbamide peroxide 6.5 % OTIC solution Commonly known as: DEBROX Place 5 drops into the right ear 2 (two) times daily.   clopidogrel  75 MG tablet Commonly known as: PLAVIX  Take 75 mg by mouth in the morning.   colchicine  0.6 MG tablet Take 0.6 mg by mouth 2 (two) times daily.   diphenhydrAMINE  25 MG tablet Commonly known as: BENADRYL  Take 50 mg by mouth at bedtime.   famotidine  20 MG tablet Commonly known as: Pepcid  Take 1 tablet (20 mg total) by mouth 2 (two) times daily.   FeroSul 325 (65 Fe) MG tablet Generic drug: ferrous sulfate  Take 325 mg by mouth in the morning.   folic acid  1 MG tablet Commonly known as: FOLVITE  Take 1 tablet (1 mg total) by mouth daily. Start taking on: March 28, 2024   hydrochlorothiazide 25 MG tablet Commonly known as: HYDRODIURIL Take 25 mg by mouth every morning.   losartan 50 MG tablet Commonly known as: COZAAR Take 50 mg by mouth 2 (two) times daily.   melatonin 5 MG Tabs Take 5 mg by  mouth at bedtime as needed.   metFORMIN 500 MG tablet Commonly known as: GLUCOPHAGE Take 500 mg by mouth in the morning.   metoCLOPramide  5 MG tablet Commonly known as: REGLAN  Take 1 tablet (5 mg total) by mouth 3 (three) times daily before meals.   pantoprazole  40 MG tablet Commonly known as: PROTONIX  Take 1 tablet (40 mg total) by mouth daily before breakfast.   potassium chloride  10 MEQ tablet Commonly known as: KLOR-CON  Take 10 mEq by mouth every morning.   saccharomyces boulardii 250 MG capsule Commonly known as: FLORASTOR Take 1 capsule (250 mg total) by mouth 2 (two) times daily.   simethicone  80 MG chewable tablet Commonly known as: MYLICON Chew 1 tablet (80 mg total) by mouth 4 (four) times daily as needed for flatulence.        Contact information for after-discharge care     Destination     [facility] .   Service: Skilled Nursing Contact information: [address] [place]   [address] [phone]                    Allergies  Allergen Reactions   Erythromycin Swelling   Lisinopril Swelling   Penicillins Swelling    You were cared for by a hospitalist during your hospital stay. If you have any questions about your discharge medications or the care you received while you were in the hospital after you are discharged, you can call the unit and asked to speak with the hospitalist on call if the hospitalist that took care of you is not available. Once you are discharged, your primary care physician will handle any further medical issues. Please note that no refills for any discharge medications will be authorized once you are discharged, as it is imperative that you return to your primary care physician (or establish a relationship with a primary care physician if you do not have one) for your aftercare needs so that they can reassess your need for medications and monitor your lab values.  You were cared for by a hospitalist during your hospital stay. If you have any questions about your discharge medications or the care you received while you were in the hospital after you are discharged, you can call the unit and asked to speak with the hospitalist on call if the hospitalist that took care of you is not available. Once you are discharged, your primary care physician will handle any further medical issues. Please note that NO REFILLS for any discharge medications will be authorized once you are discharged, as it is imperative that you return to your primary care physician (or establish a relationship with a primary care physician if you do not have one) for your aftercare needs so that they can reassess your need for medications and monitor your lab values.  Please request your Prim.MD to go over all Hospital Tests and Procedure/Radiological results at the follow up, please get all Hospital records sent to your Prim MD by signing hospital release before you go  home.  Get CBC, CMP, 2 view Chest X ray checked  by Primary MD during your next visit or SNF MD in 5-7 days ( we routinely change or add medications that can affect your baseline labs and fluid status, therefore we recommend that you get the mentioned basic workup next visit with your PCP, your PCP may decide not to get them or add new tests based on their clinical decision)  On your next visit with  your primary care physician please Get Medicines reviewed and adjusted.  If you experience worsening of your admission symptoms, develop shortness of breath, life threatening emergency, suicidal or homicidal thoughts you must seek medical attention immediately by calling 911 or calling your MD immediately  if symptoms less severe.  You Must read complete instructions/literature along with all the possible adverse reactions/side effects for all the Medicines you take and that have been prescribed to you. Take any new Medicines after you have completely understood and accpet all the possible adverse reactions/side effects.   Do not drive, operate heavy machinery, perform activities at heights, swimming or participation in water activities or provide baby sitting services if your were admitted for syncope or siezures until you have seen by Primary MD or a Neurologist and advised to do so again.  Do not drive when taking Pain medications.   Procedures/Studies: CT ABDOMEN PELVIS WO CONTRAST Result Date: 03/26/2024 CLINICAL DATA:  Acute abdominal pain EXAM: CT ABDOMEN AND PELVIS WITHOUT CONTRAST TECHNIQUE: Multidetector CT imaging of the abdomen and pelvis was performed following the standard protocol without IV contrast. RADIATION DOSE REDUCTION: This exam was performed according to the departmental dose-optimization program which includes automated exposure control, adjustment of the mA and/or kV according to patient size and/or use of iterative reconstruction technique. COMPARISON:  03/18/2024 FINDINGS: Lower  chest: Descending thoracic aortic and coronary atherosclerosis. Mild scarring or subsegmental atelectasis in the posterior basal segment left lower lobe. Hepatobiliary: Cholecystectomy.  Otherwise unremarkable. Pancreas: Unremarkable Spleen: Unremarkable Adrenals/Urinary Tract: Fluid density lesion in the right mid to lower kidney measuring 1.1 cm in diameter on image 27 series 3, most compatible with a cyst. Similar fluid density lesion in the left kidney lower pole posteriorly. No further imaging workup of these lesions is indicated. Stomach/Bowel: Notable volume of fluid and some gas in the stomach. Scattered diverticula of the descending and sigmoid colon. No dilated bowel. Vascular/Lymphatic: Atherosclerosis is present, including aortoiliac atherosclerotic disease. Reproductive: Uterus absent. Other: No supplemental non-categorized findings. Musculoskeletal: Umbilical hernia contains adipose tissue. Bridging spurring of the right sacroiliac joint. Anterior interbody bridging spurring in the lower thoracic spine. Grade 1 degenerative anterolisthesis at L4-5. IMPRESSION: 1. A specific cause for the patient's acute abdominal pain is not identified. 2. Notable volume of fluid and some gas in the stomach. 3. Scattered diverticula of the descending and sigmoid colon, without findings of diverticulitis. 4. Umbilical hernia contains adipose tissue. 5. Grade 1 degenerative anterolisthesis at L4-5. 6.  Aortic Atherosclerosis (ICD10-I70.0). Electronically Signed   By: Ryan Salvage M.D.   On: 03/26/2024 13:55   DG Abd 2 Views Result Date: 03/25/2024 CLINICAL DATA:  Gastric distention. EXAM: ABDOMEN - 2 VIEW COMPARISON:  March 18, 2024. FINDINGS: No abnormal bowel dilatation. Moderate amount of stool seen throughout the colon. Status post cholecystectomy. IMPRESSION: Moderate stool burden. No abnormal bowel dilatation. Electronically Signed   By: Lynwood Landy Raddle M.D.   On: 03/25/2024 13:38   DG CHEST PORT 1  VIEW Result Date: 03/24/2024 CLINICAL DATA:  Short of breath EXAM: PORTABLE CHEST 1 VIEW COMPARISON:  03/25/2006 FINDINGS: Single frontal view of the chest demonstrates an unremarkable cardiac silhouette. No acute airspace disease, effusion, or pneumothorax. No acute bony abnormalities. Moderate gaseous distention of the stomach. IMPRESSION: 1. No acute intrathoracic process. 2. Gaseous distension of the stomach. Electronically Signed   By: Ozell Daring M.D.   On: 03/24/2024 17:22   MR BRAIN WO CONTRAST Result Date: 03/20/2024 EXAM: MRI BRAIN WITHOUT CONTRAST 03/20/2024  01:40:00 AM TECHNIQUE: Multiplanar multisequence MRI of the head/brain was performed without the administration of intravenous contrast. COMPARISON: 06/27/2010 CLINICAL HISTORY: Transient ischemic attack (TIA). FINDINGS: BRAIN AND VENTRICLES: No acute infarct. No intracranial hemorrhage. No mass. No midline shift. No hydrocephalus. The sella is unremarkable. Normal flow voids. Multifocal hyperintense T2-weighted signal within the cerebral white matter, most commonly due to chronic small vessel disease. ORBITS: No acute abnormality. SINUSES AND MASTOIDS: No acute abnormality. BONES AND SOFT TISSUES: Normal marrow signal. No acute soft tissue abnormality. IMPRESSION: 1. No acute intracranial abnormality. 2. Multifocal hyperintense T2-weighted signal within the cerebral white matter, most commonly due to chronic small vessel disease. Electronically signed by: Franky Stanford MD 03/20/2024 01:57 AM EDT RP Workstation: [id number]   CT Head Wo Contrast Result Date: 03/19/2024 CLINICAL DATA:  Fall injury, neuro deficit, stroke suspected. EXAM: CT HEAD WITHOUT CONTRAST TECHNIQUE: Contiguous axial images were obtained from the base of the skull through the vertex without intravenous contrast. RADIATION DOSE REDUCTION: This exam was performed according to the departmental dose-optimization program which includes automated exposure control, adjustment of the  mA and/or kV according to patient size and/or use of iterative reconstruction technique. COMPARISON:  CT scan head 06/28/2010 FINDINGS: Brain: Positioning is nonstandard due to the being tilted obliquely to the left. There is mild global atrophy and mild small-vessel disease of the cerebral white matter, progressed from 2011. The ventricles remain normal in size and position. Basal cisterns are clear. No cortical based acute infarct, hemorrhage, mass or mass effect is seen. There is a chronic right pontine lacunar infarct. Vascular: There are moderate calcifications in the carotid siphons. No hyperdense central vessel is seen. Skull: Negative for fractures or focal lesions. Sinuses/Orbits: Clear visualized sinuses, right mastoid air cells. There is fluid in the left mastoid tip. Above this the left mastoids are otherwise clear. Other: None. IMPRESSION: 1. No acute intracranial CT findings or depressed skull fractures. 2. Mild atrophy and small-vessel disease. 3. Chronic right pontine lacunar infarct. 4. Left mastoid tip effusion. Electronically Signed   By: Francis Quam M.D.   On: 03/19/2024 21:46   CT ABDOMEN PELVIS WO CONTRAST Result Date: 03/18/2024 CLINICAL DATA:  Left lower quadrant abdominal pain. EXAM: CT ABDOMEN AND PELVIS WITHOUT CONTRAST TECHNIQUE: Multidetector CT imaging of the abdomen and pelvis was performed following the standard protocol without IV contrast. RADIATION DOSE REDUCTION: This exam was performed according to the departmental dose-optimization program which includes automated exposure control, adjustment of the mA and/or kV according to patient size and/or use of iterative reconstruction technique. COMPARISON:  None Available. FINDINGS: Lower chest: No acute abnormality. Hepatobiliary: No suspicious focal hepatic lesion identified within the limits of an unenhanced exam. Status post cholecystectomy. No biliary dilatation. Pancreas: Unremarkable Spleen: Unremarkable Adrenals/Urinary  Tract: Adrenal glands are unremarkable. 1.8 cm left renal cyst, for which no follow-up imaging is recommended. No renal or ureteral calculi. No hydronephrosis. Bladder is unremarkable. Stomach/Bowel: The stomach and small bowel are grossly unremarkable. No obstruction. Appendix is not clearly identified. No pericecal inflammatory changes. Air-fluid levels throughout the colon, which can be seen in the setting of diarrheal illness. Vascular/Lymphatic: Aortic atherosclerosis. Nonaneurysmal abdominal aorta. No enlarged abdominal or pelvic lymph nodes. Reproductive: Status post hysterectomy. No adnexal masses. Other: Fat containing umbilical hernia with the hernia neck measuring approximately 3.8 cm transverse. Musculoskeletal: No acute osseous abnormality. No suspicious osseous lesion. Degenerative changes of the thoracolumbar spine. IMPRESSION: 1. Air-fluid levels throughout the colon, which can be seen in the setting of diarrheal  illness. 2. Otherwise, no acute localizing findings in the abdomen or pelvis. 3. Fat containing umbilical hernia. 4.  Aortic Atherosclerosis (ICD10-I70.0). Electronically Signed   By: Harrietta Sherry M.D.   On: 03/18/2024 12:16     The results of significant diagnostics from this hospitalization (including imaging, microbiology, ancillary and laboratory) are listed below for reference.     Microbiology: Recent Results (from the past 240 hours)  C Difficile Quick Screen w PCR reflex     Status: None   Collection Time: 03/20/24 12:35 PM   Specimen: STOOL  Result Value Ref Range Status   C Diff antigen NEGATIVE NEGATIVE Final   C Diff toxin NEGATIVE NEGATIVE Final   C Diff interpretation No C. difficile detected.  Final    Comment: Performed at [facility], [address], [place], [place] [address]     Labs: BNP (last 3 results) Recent Labs    12/17/23 1853  BNP 123.7*   Basic Metabolic Panel: Recent Labs  Lab 03/22/24 0850 03/23/24 0550 03/24/24 0510  03/25/24 0317 03/26/24 0444 03/27/24 0554  NA 137 137 139 139 136 135  K 3.6 4.1 4.6 4.9 4.7 4.7  CL 102 105 104 103 102 101  CO2 22 26 27 25 25 27   GLUCOSE 121* 102* 81 103* 110* 109*  BUN 11 14 14 15 12 12   CREATININE 1.16* 1.01* 1.44* 1.17* 0.92 1.05*  CALCIUM  8.0* 8.5* 9.2 9.3 9.4 9.0  MG 1.5* 2.1 2.0 1.6* 1.6*  --   PHOS 2.4* 2.3* 3.5 4.3 3.9 4.5   Liver Function Tests: Recent Labs  Lab 03/21/24 0455 03/22/24 0850 03/23/24 0550 03/24/24 0510 03/25/24 0317 03/26/24 0444 03/27/24 0554  AST 43*  --   --   --   --   --   --   ALT 32  --   --   --   --   --   --   ALKPHOS 105  --   --   --   --   --   --   BILITOT 1.0  --   --   --   --   --   --   PROT 5.3*  --   --   --   --   --   --   ALBUMIN 2.6*   < > 2.5* 2.5* 2.2* 2.4* 2.3*   < > = values in this interval not displayed.   No results for input(s): LIPASE, AMYLASE in the last 168 hours. No results for input(s): AMMONIA in the last 168 hours. CBC: Recent Labs  Lab 03/23/24 0550 03/24/24 0510 03/25/24 0317 03/26/24 0444 03/27/24 0554  WBC 7.0 6.9 6.9 6.5 6.2  HGB 9.9* 9.4* 9.0* 9.6* 9.0*  HCT 29.4* 29.1* 27.5* 28.8* 27.5*  MCV 76.6* 79.1* 79.5* 77.4* 77.7*  PLT 151 147* 134* 146* 143*   Cardiac Enzymes: No results for input(s): CKTOTAL, CKMB, CKMBINDEX, TROPONINI in the last 168 hours. BNP: Invalid input(s): POCBNP CBG: Recent Labs  Lab 03/26/24 1130 03/26/24 1600 03/26/24 2156 03/27/24 0759 03/27/24 1102  GLUCAP 139* 144* 117* 101* 123*   D-Dimer No results for input(s): DDIMER in the last 72 hours. Hgb A1c No results for input(s): HGBA1C in the last 72 hours. Lipid Profile No results for input(s): CHOL, HDL, LDLCALC, TRIG, CHOLHDL, LDLDIRECT in the last 72 hours. Thyroid  function studies No results for input(s): TSH, T4TOTAL, T3FREE, THYROIDAB in the last 72 hours.  Invalid input(s): FREET3 Anemia work  up No results for input(s): VITAMINB12,  FOLATE, FERRITIN, TIBC, IRON , RETICCTPCT in the last 72 hours. Urinalysis    Component Value Date/Time   COLORURINE YELLOW 03/20/2024 0300   APPEARANCEUR CLOUDY (A) 03/20/2024 0300   LABSPEC 1.010 03/20/2024 0300   PHURINE 5.0 03/20/2024 0300   GLUCOSEU NEGATIVE 03/20/2024 0300   HGBUR SMALL (A) 03/20/2024 0300   BILIRUBINUR NEGATIVE 03/20/2024 0300   KETONESUR NEGATIVE 03/20/2024 0300   PROTEINUR 30 (A) 03/20/2024 0300   UROBILINOGEN 0.2 07/24/2010 1420   NITRITE NEGATIVE 03/20/2024 0300   LEUKOCYTESUR LARGE (A) 03/20/2024 0300   Sepsis Labs Recent Labs  Lab 03/24/24 0510 03/25/24 0317 03/26/24 0444 03/27/24 0554  WBC 6.9 6.9 6.5 6.2   Microbiology Recent Results (from the past 240 hours)  C Difficile Quick Screen w PCR reflex     Status: None   Collection Time: 03/20/24 12:35 PM   Specimen: STOOL  Result Value Ref Range Status   C Diff antigen NEGATIVE NEGATIVE Final   C Diff toxin NEGATIVE NEGATIVE Final   C Diff interpretation No C. difficile detected.  Final    Comment: Performed at [facility], [address], [place], [place] [address]     Time coordinating discharge:  I have spent 35 minutes face to face with the patient and on the ward discussing the patients care, assessment, plan and disposition with other care givers. >50% of the time was devoted counseling the patient about the risks and benefits of treatment/Discharge disposition and coordinating care.   SIGNED:   Burgess JAYSON Dare, MD  [facility] 03/27/2024, 12:16 PM   If 7PM-7AM, please contact night-coverage

## 2024-03-27 NOTE — TOC Transition Note (Signed)
 Transition of Care [facility]) - Discharge Note   Patient Details  Name: Laurie Hunter MRN: [id number] Date of Birth: 10/16/1945  Transition of Care [facility]) CM/SW Contact:  Lendia Dais, LCSWA Phone Number: 03/27/2024, 12:38 PM   Clinical Narrative: Pt is discharging to [facility]. RN report to [phone]. PTAR was called at 12:30.  CSW called [name] (sister) to alert of pt's discharge. CSW left a VM.  No further TOC needs.    Final next level of care: Skilled Nursing Facility Barriers to Discharge: Barriers Resolved   Patient Goals and CMS Choice Patient states their goals for this hospitalization and ongoing recovery are:: Spoke with pt's sister, rehab is better right now until she can do for herself CMS Medicare.gov Compare Post Acute Care list provided to:: Patient Represenative (must comment) (pt's [name]) Choice offered to / list presented to : Patient      Discharge Placement              Patient chooses bed at: [facility] Patient to be transferred to facility by: PTAR Name of family member notified: [name] (sister) Patient and family notified of of transfer: 03/27/24  Discharge Plan and Services Additional resources added to the After Visit Summary for   In-house Referral: Clinical Social Work                                   Social Drivers of Health (SDOH) Interventions SDOH Screenings   Food Insecurity: No Food Insecurity (03/20/2024)  Housing: Low Risk  (03/20/2024)  Transportation Needs: Unmet Transportation Needs (03/20/2024)  Utilities: Not At Risk (03/20/2024)  Social Connections: Moderately Integrated (03/20/2024)  Tobacco Use: Unknown (03/20/2024)     Readmission Risk Interventions     No data to display

## 2024-03-28 DIAGNOSIS — I69354 Hemiplegia and hemiparesis following cerebral infarction affecting left non-dominant side: Secondary | ICD-10-CM | POA: Diagnosis not present

## 2024-03-28 DIAGNOSIS — R531 Weakness: Secondary | ICD-10-CM | POA: Diagnosis not present

## 2024-03-28 DIAGNOSIS — Z7409 Other reduced mobility: Secondary | ICD-10-CM | POA: Diagnosis not present

## 2024-03-28 DIAGNOSIS — I6930 Unspecified sequelae of cerebral infarction: Secondary | ICD-10-CM | POA: Diagnosis not present

## 2024-03-29 DIAGNOSIS — I69354 Hemiplegia and hemiparesis following cerebral infarction affecting left non-dominant side: Secondary | ICD-10-CM | POA: Diagnosis not present

## 2024-03-29 DIAGNOSIS — R531 Weakness: Secondary | ICD-10-CM | POA: Diagnosis not present

## 2024-03-29 DIAGNOSIS — Z87440 Personal history of urinary (tract) infections: Secondary | ICD-10-CM | POA: Diagnosis not present

## 2024-03-29 DIAGNOSIS — Z7409 Other reduced mobility: Secondary | ICD-10-CM | POA: Diagnosis not present

## 2024-03-30 DIAGNOSIS — R531 Weakness: Secondary | ICD-10-CM | POA: Diagnosis not present

## 2024-03-30 DIAGNOSIS — I69354 Hemiplegia and hemiparesis following cerebral infarction affecting left non-dominant side: Secondary | ICD-10-CM | POA: Diagnosis not present

## 2024-03-30 DIAGNOSIS — Z7409 Other reduced mobility: Secondary | ICD-10-CM | POA: Diagnosis not present

## 2024-03-30 DIAGNOSIS — I6930 Unspecified sequelae of cerebral infarction: Secondary | ICD-10-CM | POA: Diagnosis not present

## 2024-03-31 DIAGNOSIS — U071 COVID-19: Secondary | ICD-10-CM | POA: Diagnosis not present

## 2024-03-31 DIAGNOSIS — E611 Iron deficiency: Secondary | ICD-10-CM | POA: Diagnosis not present

## 2024-03-31 DIAGNOSIS — N189 Chronic kidney disease, unspecified: Secondary | ICD-10-CM | POA: Diagnosis not present

## 2024-03-31 DIAGNOSIS — I10 Essential (primary) hypertension: Secondary | ICD-10-CM | POA: Diagnosis not present

## 2024-03-31 DIAGNOSIS — R531 Weakness: Secondary | ICD-10-CM | POA: Diagnosis not present

## 2024-03-31 DIAGNOSIS — E119 Type 2 diabetes mellitus without complications: Secondary | ICD-10-CM | POA: Diagnosis not present

## 2024-03-31 DIAGNOSIS — I6930 Unspecified sequelae of cerebral infarction: Secondary | ICD-10-CM | POA: Diagnosis not present

## 2024-03-31 DIAGNOSIS — G894 Chronic pain syndrome: Secondary | ICD-10-CM | POA: Diagnosis not present

## 2024-04-01 DIAGNOSIS — I6930 Unspecified sequelae of cerebral infarction: Secondary | ICD-10-CM | POA: Diagnosis not present

## 2024-04-01 DIAGNOSIS — N390 Urinary tract infection, site not specified: Secondary | ICD-10-CM | POA: Diagnosis not present

## 2024-04-01 DIAGNOSIS — N179 Acute kidney failure, unspecified: Secondary | ICD-10-CM | POA: Diagnosis not present

## 2024-04-01 DIAGNOSIS — U071 COVID-19: Secondary | ICD-10-CM | POA: Diagnosis not present

## 2024-04-02 DIAGNOSIS — F32A Depression, unspecified: Secondary | ICD-10-CM | POA: Diagnosis not present

## 2024-04-02 DIAGNOSIS — R531 Weakness: Secondary | ICD-10-CM | POA: Diagnosis not present

## 2024-04-02 DIAGNOSIS — U071 COVID-19: Secondary | ICD-10-CM | POA: Diagnosis not present

## 2024-04-02 DIAGNOSIS — Z7409 Other reduced mobility: Secondary | ICD-10-CM | POA: Diagnosis not present

## 2024-04-04 DIAGNOSIS — G4700 Insomnia, unspecified: Secondary | ICD-10-CM | POA: Diagnosis not present

## 2024-04-04 DIAGNOSIS — U071 COVID-19: Secondary | ICD-10-CM | POA: Diagnosis not present

## 2024-04-04 DIAGNOSIS — R531 Weakness: Secondary | ICD-10-CM | POA: Diagnosis not present

## 2024-04-04 DIAGNOSIS — F32A Depression, unspecified: Secondary | ICD-10-CM | POA: Diagnosis not present

## 2024-04-05 DIAGNOSIS — F32A Depression, unspecified: Secondary | ICD-10-CM | POA: Diagnosis not present

## 2024-04-05 DIAGNOSIS — U071 COVID-19: Secondary | ICD-10-CM | POA: Diagnosis not present

## 2024-04-05 DIAGNOSIS — R531 Weakness: Secondary | ICD-10-CM | POA: Diagnosis not present

## 2024-04-05 DIAGNOSIS — Z7409 Other reduced mobility: Secondary | ICD-10-CM | POA: Diagnosis not present

## 2024-04-06 DIAGNOSIS — R531 Weakness: Secondary | ICD-10-CM | POA: Diagnosis not present

## 2024-04-06 DIAGNOSIS — F419 Anxiety disorder, unspecified: Secondary | ICD-10-CM | POA: Diagnosis not present

## 2024-04-06 DIAGNOSIS — U071 COVID-19: Secondary | ICD-10-CM | POA: Diagnosis not present

## 2024-04-06 DIAGNOSIS — Z7409 Other reduced mobility: Secondary | ICD-10-CM | POA: Diagnosis not present

## 2024-04-07 DIAGNOSIS — G4700 Insomnia, unspecified: Secondary | ICD-10-CM | POA: Diagnosis not present

## 2024-04-07 DIAGNOSIS — U071 COVID-19: Secondary | ICD-10-CM | POA: Diagnosis not present

## 2024-04-07 DIAGNOSIS — Z87440 Personal history of urinary (tract) infections: Secondary | ICD-10-CM | POA: Diagnosis not present

## 2024-04-07 DIAGNOSIS — I6930 Unspecified sequelae of cerebral infarction: Secondary | ICD-10-CM | POA: Diagnosis not present

## 2024-04-09 DIAGNOSIS — R531 Weakness: Secondary | ICD-10-CM | POA: Diagnosis not present

## 2024-04-09 DIAGNOSIS — U071 COVID-19: Secondary | ICD-10-CM | POA: Diagnosis not present

## 2024-04-09 DIAGNOSIS — R4182 Altered mental status, unspecified: Secondary | ICD-10-CM | POA: Diagnosis not present

## 2024-04-09 DIAGNOSIS — I6930 Unspecified sequelae of cerebral infarction: Secondary | ICD-10-CM | POA: Diagnosis not present

## 2024-04-11 DIAGNOSIS — R4182 Altered mental status, unspecified: Secondary | ICD-10-CM | POA: Diagnosis not present

## 2024-04-11 DIAGNOSIS — I69391 Dysphagia following cerebral infarction: Secondary | ICD-10-CM | POA: Diagnosis not present

## 2024-04-11 DIAGNOSIS — R531 Weakness: Secondary | ICD-10-CM | POA: Diagnosis not present

## 2024-04-11 DIAGNOSIS — U071 COVID-19: Secondary | ICD-10-CM | POA: Diagnosis not present

## 2024-04-12 ENCOUNTER — Other Ambulatory Visit: Payer: Self-pay

## 2024-04-12 ENCOUNTER — Emergency Department (HOSPITAL_COMMUNITY): Admission: EM | Admit: 2024-04-12 | Discharge: 2024-04-12 | Disposition: A | Discharge status: Skilled Nursing Facility

## 2024-04-12 ENCOUNTER — Emergency Department (HOSPITAL_COMMUNITY)

## 2024-04-12 DIAGNOSIS — E119 Type 2 diabetes mellitus without complications: Secondary | ICD-10-CM | POA: Diagnosis not present

## 2024-04-12 DIAGNOSIS — Z7982 Long term (current) use of aspirin: Secondary | ICD-10-CM | POA: Insufficient documentation

## 2024-04-12 DIAGNOSIS — R899 Unspecified abnormal finding in specimens from other organs, systems and tissues: Secondary | ICD-10-CM | POA: Insufficient documentation

## 2024-04-12 DIAGNOSIS — Z7984 Long term (current) use of oral hypoglycemic drugs: Secondary | ICD-10-CM | POA: Insufficient documentation

## 2024-04-12 DIAGNOSIS — U071 COVID-19: Secondary | ICD-10-CM | POA: Diagnosis not present

## 2024-04-12 DIAGNOSIS — Z79899 Other long term (current) drug therapy: Secondary | ICD-10-CM | POA: Diagnosis not present

## 2024-04-12 DIAGNOSIS — R4182 Altered mental status, unspecified: Secondary | ICD-10-CM | POA: Insufficient documentation

## 2024-04-12 DIAGNOSIS — R531 Weakness: Secondary | ICD-10-CM | POA: Diagnosis not present

## 2024-04-12 DIAGNOSIS — R799 Abnormal finding of blood chemistry, unspecified: Secondary | ICD-10-CM | POA: Diagnosis not present

## 2024-04-12 DIAGNOSIS — Z7409 Other reduced mobility: Secondary | ICD-10-CM | POA: Diagnosis not present

## 2024-04-12 DIAGNOSIS — I10 Essential (primary) hypertension: Secondary | ICD-10-CM | POA: Diagnosis not present

## 2024-04-12 LAB — CBC WITH DIFFERENTIAL/PLATELET
Abs Immature Granulocytes: 0.03 K/uL (ref 0.00–0.07)
Basophils Absolute: 0.1 K/uL (ref 0.0–0.1)
Basophils Relative: 1 %
Eosinophils Absolute: 0.4 K/uL (ref 0.0–0.5)
Eosinophils Relative: 5 %
HCT: 33.6 % — ABNORMAL LOW (ref 36.0–46.0)
Hemoglobin: 11.1 g/dL — ABNORMAL LOW (ref 12.0–15.0)
Immature Granulocytes: 0 %
Lymphocytes Relative: 25 %
Lymphs Abs: 1.9 K/uL (ref 0.7–4.0)
MCH: 25.4 pg — ABNORMAL LOW (ref 26.0–34.0)
MCHC: 33 g/dL (ref 30.0–36.0)
MCV: 76.9 fL — ABNORMAL LOW (ref 80.0–100.0)
Monocytes Absolute: 0.7 K/uL (ref 0.1–1.0)
Monocytes Relative: 9 %
Neutro Abs: 4.6 K/uL (ref 1.7–7.7)
Neutrophils Relative %: 60 %
Platelets: 207 K/uL (ref 150–400)
RBC: 4.37 MIL/uL (ref 3.87–5.11)
RDW: 20.9 % — ABNORMAL HIGH (ref 11.5–15.5)
WBC: 7.7 K/uL (ref 4.0–10.5)
nRBC: 0 % (ref 0.0–0.2)

## 2024-04-12 LAB — COMPREHENSIVE METABOLIC PANEL WITH GFR
ALT: 28 U/L (ref 0–44)
AST: 32 U/L (ref 15–41)
Albumin: 2.9 g/dL — ABNORMAL LOW (ref 3.5–5.0)
Alkaline Phosphatase: 99 U/L (ref 38–126)
Anion gap: 10 (ref 5–15)
BUN: 20 mg/dL (ref 8–23)
CO2: 22 mmol/L (ref 22–32)
Calcium: 9 mg/dL (ref 8.9–10.3)
Chloride: 99 mmol/L (ref 98–111)
Creatinine, Ser: 1.56 mg/dL — ABNORMAL HIGH (ref 0.44–1.00)
GFR, Estimated: 34 mL/min — ABNORMAL LOW (ref >60)
Glucose, Bld: 107 mg/dL — ABNORMAL HIGH (ref 70–99)
Potassium: 3.8 mmol/L (ref 3.5–5.1)
Sodium: 131 mmol/L — ABNORMAL LOW (ref 135–145)
Total Bilirubin: 1.1 mg/dL (ref 0.0–1.2)
Total Protein: 6.5 g/dL (ref 6.5–8.1)

## 2024-04-12 LAB — LIPASE, BLOOD: Lipase: 29 U/L (ref 11–51)

## 2024-04-12 LAB — AMMONIA: Ammonia: 32 umol/L (ref 9–35)

## 2024-04-12 NOTE — ED Triage Notes (Signed)
 Coming from facility with reported high ammonia levels.

## 2024-04-12 NOTE — ED Notes (Signed)
 Pt taken to CT.

## 2024-04-12 NOTE — Discharge Instructions (Signed)
 Your ammonia level when we checked it tonight was within normal limits.  Your CT scan did not show any concerning findings and the remainder of your labs were reassuring.  Please follow-up with your doctor.  Return to the emergency department for worsening symptoms.

## 2024-04-12 NOTE — ED Provider Notes (Signed)
 [place] EMERGENCY DEPARTMENT AT [facility] Provider Note   CSN: [id number] Arrival date & time: 04/12/24  8044     Patient presents with: Abnormal Lab   Laurie Hunter is a 78 y.o. female.   78 year old female with past medical history of diabetes, hypertension, and CVA in the past presenting to the emergency department today with concern for elevated ammonia levels.  The patient was apparently confused.  To baseline and an ammonia level was checked and was greater than 200.  The patient was sent in for further evaluation that time.  The patient is a poor historian but is alert and oriented.  She denies any complaints currently.   Abnormal Lab      Prior to Admission medications   Medication Sig Start Date End Date Taking? Authorizing Provider  acetaminophen  (TYLENOL ) 500 MG tablet Take 500-1,000 mg by mouth every 6 (six) hours as needed for mild pain (pain score 1-3) or moderate pain (pain score 4-6).    [provider]  allopurinol  (ZYLOPRIM ) 100 MG tablet Take 100 mg by mouth in the morning.    [provider]  amLODipine  (NORVASC ) 10 MG tablet Take 10 mg by mouth at bedtime.    [provider]  aspirin  81 MG tablet Take 81 mg by mouth in the morning.    [provider]  atenolol (TENORMIN) 50 MG tablet Take 50 mg by mouth in the morning.    [provider]  atorvastatin  (LIPITOR) 20 MG tablet Take 20 mg by mouth at bedtime.    [provider]  bisacodyl  (DULCOLAX) 5 MG EC tablet Take 2 tablets (10 mg total) by mouth daily in the afternoon. 03/27/24   Amin, Ankit C, MD  Bismuth  Subsalicylate (KAOPECTATE PO) Take 1-2 tablets by mouth as needed.    [provider]  bismuth  subsalicylate (PEPTO BISMOL) 262 MG chewable tablet Chew 524 mg by mouth as needed for indigestion or diarrhea or loose stools.    [provider]  carbamide peroxide (DEBROX) 6.5 % OTIC solution Place 5 drops into the right ear 2  (two) times daily. 11/01/23   Cindy Garnette POUR, MD  clopidogrel  (PLAVIX ) 75 MG tablet Take 75 mg by mouth in the morning.    [provider]  colchicine  0.6 MG tablet Take 0.6 mg by mouth 2 (two) times daily.    [provider]  diphenhydrAMINE  (BENADRYL ) 25 MG tablet Take 50 mg by mouth at bedtime. 10/17/12   [provider]  famotidine  (PEPCID ) 20 MG tablet Take 1 tablet (20 mg total) by mouth 2 (two) times daily. 10/17/12   Cleotilde Rogue, MD  FEROSUL 325 (65 Fe) MG tablet Take 325 mg by mouth in the morning. 03/08/20   [provider]  folic acid  (FOLVITE ) 1 MG tablet Take 1 tablet (1 mg total) by mouth daily. 03/28/24   Amin, Ankit C, MD  hydrochlorothiazide (HYDRODIURIL) 25 MG tablet Take 25 mg by mouth every morning.    [provider]  losartan (COZAAR) 50 MG tablet Take 50 mg by mouth 2 (two) times daily.    [provider]  melatonin 5 MG TABS Take 5 mg by mouth at bedtime as needed.    [provider]  metFORMIN (GLUCOPHAGE) 500 MG tablet Take 500 mg by mouth in the morning.    [provider]  metoCLOPramide  (REGLAN ) 5 MG tablet Take 1 tablet (5 mg total) by mouth 3 (three) times daily before meals.  03/27/24   Amin, Ankit C, MD  pantoprazole  (PROTONIX ) 40 MG tablet Take 1 tablet (40 mg total) by mouth daily before breakfast. 03/27/24   Amin, Ankit C, MD  potassium chloride  (KLOR-CON ) 10 MEQ tablet Take 10 mEq by mouth every morning.    [provider]  saccharomyces boulardii (FLORASTOR) 250 MG capsule Take 1 capsule (250 mg total) by mouth 2 (two) times daily. 03/27/24   Amin, Ankit C, MD  simethicone  (MYLICON) 80 MG chewable tablet Chew 1 tablet (80 mg total) by mouth 4 (four) times daily as needed for flatulence. 03/27/24   Caleen Burgess BROCKS, MD    Allergies: Erythromycin, Lisinopril, and Penicillins    Review of Systems  Reason unable to perform ROS: Confusion/baseline mental status.  All other systems reviewed and  are negative.   Updated Vital Signs BP 127/62   Pulse (!) 54   Temp 97.7 F (36.5 C)   Resp 16   Ht 4' 8 (1.422 m)   Wt 101 kg   SpO2 100%   BMI 49.92 kg/m   Physical Exam Vitals and nursing note reviewed.   Gen: NAD Eyes: PERRL, EOMI HEENT: no oropharyngeal swelling Neck: trachea midline Resp: clear to auscultation bilaterally Card: RRR, no murmurs, rubs, or gallops Abd: nontender, nondistended Extremities: no calf tenderness, no edema Vascular: 2+ radial pulses bilaterally, 2+ DP pulses bilaterally Neuro: Alert and oriented x 2, cranial nerves intact, equal strength and sensation throughout bilateral upper and lower extremities Skin: no rashes Psyc: acting appropriately   (all labs ordered are listed, but only abnormal results are displayed) Labs Reviewed  COMPREHENSIVE METABOLIC PANEL WITH GFR - Abnormal; Notable for the following components:      Result Value   Sodium 131 (*)    Glucose, Bld 107 (*)    Creatinine, Ser 1.56 (*)    Albumin 2.9 (*)    GFR, Estimated 34 (*)    All other components within normal limits  CBC WITH DIFFERENTIAL/PLATELET - Abnormal; Notable for the following components:   Hemoglobin 11.1 (*)    HCT 33.6 (*)    MCV 76.9 (*)    MCH 25.4 (*)    RDW 20.9 (*)    All other components within normal limits  AMMONIA  CBC WITH DIFFERENTIAL/PLATELET  LIPASE, BLOOD    EKG: EKG Interpretation Date/Time:  Sunday April 12 2024 20:06:22 EDT Ventricular Rate:  54 PR Interval:  65 QRS Duration:  81 QT Interval:  485 QTC Calculation: 460 R Axis:   25  Text Interpretation: Sinus rhythm Short PR interval Probable left ventricular hypertrophy Confirmed by Ula Barter [phone]) on 04/12/2024 8:20:55 PM  Radiology: CT Head Wo Contrast Result Date: 04/12/2024 CLINICAL DATA:  Mental status change, unknown cause EXAM: CT HEAD WITHOUT CONTRAST TECHNIQUE: Contiguous axial images were obtained from the base of the skull through the vertex without  intravenous contrast. RADIATION DOSE REDUCTION: This exam was performed according to the departmental dose-optimization program which includes automated exposure control, adjustment of the mA and/or kV according to patient size and/or use of iterative reconstruction technique. COMPARISON:  03/19/2024 FINDINGS: Brain: There is atrophy and chronic small vessel disease changes. No acute intracranial abnormality. Specifically, no hemorrhage, hydrocephalus, mass lesion, acute infarction, or significant intracranial injury. Vascular: No hyperdense vessel or unexpected calcification. Skull: No acute calvarial abnormality. Sinuses/Orbits: No acute findings Other: None IMPRESSION: No acute intracranial abnormality. Electronically Signed   By: Franky Crease M.D.   On: 04/12/2024 20:54  Procedures   Medications Ordered in the ED - No data to display                                  Medical Decision Making 78 year old female with past medical history of of diabetes, hypertension, and CVA in the past presenting to the emergency department today with apparent altered mental status compared to her baseline with elevated ammonia level at her nursing facility.  Will repeat this here as well as LFTs.  Will also obtain a CT scan of her head to evaluate for intracranial hemorrhage or mass lesion.  If ammonia level is significantly elevated will start the patient on lactulose.  I will reevaluate for ultimate disposition.  The patient's ammonia level here is within normal limits.  Her creatinine is mildly elevated compared to her baseline but looking back it has ranged from 1-1.5 so I do not think this is too far off.  The patient CT scan does not show any acute findings.  She remains relatively alert and oriented here.  I think that she is stable for discharge back to her nursing facility.  Amount and/or Complexity of Data Reviewed Labs: ordered. Radiology: ordered.        Final diagnoses:  Abnormal  laboratory test    ED Discharge Orders     None          Ula Prentice SAUNDERS, MD 04/12/24 2218

## 2024-04-13 DIAGNOSIS — I6930 Unspecified sequelae of cerebral infarction: Secondary | ICD-10-CM | POA: Diagnosis not present

## 2024-04-13 DIAGNOSIS — R531 Weakness: Secondary | ICD-10-CM | POA: Diagnosis not present

## 2024-04-13 DIAGNOSIS — Z7409 Other reduced mobility: Secondary | ICD-10-CM | POA: Diagnosis not present

## 2024-04-14 DIAGNOSIS — N1831 Chronic kidney disease, stage 3a: Secondary | ICD-10-CM | POA: Diagnosis not present

## 2024-04-14 DIAGNOSIS — E119 Type 2 diabetes mellitus without complications: Secondary | ICD-10-CM | POA: Diagnosis not present

## 2024-04-14 DIAGNOSIS — Z8616 Personal history of COVID-19: Secondary | ICD-10-CM | POA: Diagnosis not present

## 2024-04-14 DIAGNOSIS — R4182 Altered mental status, unspecified: Secondary | ICD-10-CM | POA: Diagnosis not present

## 2024-04-14 DIAGNOSIS — L22 Diaper dermatitis: Secondary | ICD-10-CM | POA: Diagnosis not present

## 2024-04-14 DIAGNOSIS — E1140 Type 2 diabetes mellitus with diabetic neuropathy, unspecified: Secondary | ICD-10-CM | POA: Diagnosis not present

## 2024-04-14 DIAGNOSIS — R531 Weakness: Secondary | ICD-10-CM | POA: Diagnosis not present

## 2024-04-14 DIAGNOSIS — I10 Essential (primary) hypertension: Secondary | ICD-10-CM | POA: Diagnosis not present

## 2024-04-14 DIAGNOSIS — E611 Iron deficiency: Secondary | ICD-10-CM | POA: Diagnosis not present

## 2024-04-14 DIAGNOSIS — G894 Chronic pain syndrome: Secondary | ICD-10-CM | POA: Diagnosis not present

## 2024-04-14 DIAGNOSIS — F0390 Unspecified dementia without behavioral disturbance: Secondary | ICD-10-CM | POA: Diagnosis not present

## 2024-04-17 DIAGNOSIS — F32A Depression, unspecified: Secondary | ICD-10-CM | POA: Diagnosis not present

## 2024-04-17 DIAGNOSIS — I69354 Hemiplegia and hemiparesis following cerebral infarction affecting left non-dominant side: Secondary | ICD-10-CM | POA: Diagnosis not present

## 2024-04-17 DIAGNOSIS — G4700 Insomnia, unspecified: Secondary | ICD-10-CM | POA: Diagnosis not present

## 2024-04-17 DIAGNOSIS — R1311 Dysphagia, oral phase: Secondary | ICD-10-CM | POA: Diagnosis not present

## 2024-04-17 DIAGNOSIS — K429 Umbilical hernia without obstruction or gangrene: Secondary | ICD-10-CM | POA: Diagnosis not present

## 2024-04-17 DIAGNOSIS — L89152 Pressure ulcer of sacral region, stage 2: Secondary | ICD-10-CM | POA: Diagnosis not present

## 2024-04-17 DIAGNOSIS — I890 Lymphedema, not elsewhere classified: Secondary | ICD-10-CM | POA: Diagnosis not present

## 2024-04-17 DIAGNOSIS — E876 Hypokalemia: Secondary | ICD-10-CM | POA: Diagnosis not present

## 2024-04-17 DIAGNOSIS — E1122 Type 2 diabetes mellitus with diabetic chronic kidney disease: Secondary | ICD-10-CM | POA: Diagnosis not present

## 2024-04-17 DIAGNOSIS — E782 Mixed hyperlipidemia: Secondary | ICD-10-CM | POA: Diagnosis not present

## 2024-04-17 DIAGNOSIS — D631 Anemia in chronic kidney disease: Secondary | ICD-10-CM | POA: Diagnosis not present

## 2024-04-17 DIAGNOSIS — N390 Urinary tract infection, site not specified: Secondary | ICD-10-CM | POA: Diagnosis not present

## 2024-04-17 DIAGNOSIS — D529 Folate deficiency anemia, unspecified: Secondary | ICD-10-CM | POA: Diagnosis not present

## 2024-04-17 DIAGNOSIS — M170 Bilateral primary osteoarthritis of knee: Secondary | ICD-10-CM | POA: Diagnosis not present

## 2024-04-17 DIAGNOSIS — U071 COVID-19: Secondary | ICD-10-CM | POA: Diagnosis not present

## 2024-04-17 DIAGNOSIS — N1832 Chronic kidney disease, stage 3b: Secondary | ICD-10-CM | POA: Diagnosis not present

## 2024-04-17 DIAGNOSIS — M1A9XX0 Chronic gout, unspecified, without tophus (tophi): Secondary | ICD-10-CM | POA: Diagnosis not present

## 2024-04-17 DIAGNOSIS — M810 Age-related osteoporosis without current pathological fracture: Secondary | ICD-10-CM | POA: Diagnosis not present

## 2024-04-17 DIAGNOSIS — K219 Gastro-esophageal reflux disease without esophagitis: Secondary | ICD-10-CM | POA: Diagnosis not present

## 2024-04-17 DIAGNOSIS — I69391 Dysphagia following cerebral infarction: Secondary | ICD-10-CM | POA: Diagnosis not present

## 2024-04-17 DIAGNOSIS — I129 Hypertensive chronic kidney disease with stage 1 through stage 4 chronic kidney disease, or unspecified chronic kidney disease: Secondary | ICD-10-CM | POA: Diagnosis not present

## 2024-04-17 DIAGNOSIS — G894 Chronic pain syndrome: Secondary | ICD-10-CM | POA: Diagnosis not present

## 2024-04-17 DIAGNOSIS — E1140 Type 2 diabetes mellitus with diabetic neuropathy, unspecified: Secondary | ICD-10-CM | POA: Diagnosis not present

## 2024-04-17 DIAGNOSIS — F419 Anxiety disorder, unspecified: Secondary | ICD-10-CM | POA: Diagnosis not present

## 2024-04-22 DIAGNOSIS — E1122 Type 2 diabetes mellitus with diabetic chronic kidney disease: Secondary | ICD-10-CM | POA: Diagnosis not present

## 2024-04-22 DIAGNOSIS — R531 Weakness: Secondary | ICD-10-CM | POA: Diagnosis not present

## 2024-04-22 DIAGNOSIS — Z23 Encounter for immunization: Secondary | ICD-10-CM | POA: Diagnosis not present

## 2024-04-22 DIAGNOSIS — E46 Unspecified protein-calorie malnutrition: Secondary | ICD-10-CM | POA: Diagnosis not present

## 2024-04-24 DIAGNOSIS — N390 Urinary tract infection, site not specified: Secondary | ICD-10-CM | POA: Diagnosis not present

## 2024-04-24 DIAGNOSIS — E1122 Type 2 diabetes mellitus with diabetic chronic kidney disease: Secondary | ICD-10-CM | POA: Diagnosis not present

## 2024-04-24 DIAGNOSIS — M810 Age-related osteoporosis without current pathological fracture: Secondary | ICD-10-CM | POA: Diagnosis not present

## 2024-04-24 DIAGNOSIS — F32A Depression, unspecified: Secondary | ICD-10-CM | POA: Diagnosis not present

## 2024-04-24 DIAGNOSIS — M170 Bilateral primary osteoarthritis of knee: Secondary | ICD-10-CM | POA: Diagnosis not present

## 2024-04-24 DIAGNOSIS — I890 Lymphedema, not elsewhere classified: Secondary | ICD-10-CM | POA: Diagnosis not present

## 2024-04-24 DIAGNOSIS — M1A9XX0 Chronic gout, unspecified, without tophus (tophi): Secondary | ICD-10-CM | POA: Diagnosis not present

## 2024-04-24 DIAGNOSIS — G894 Chronic pain syndrome: Secondary | ICD-10-CM | POA: Diagnosis not present

## 2024-04-24 DIAGNOSIS — E1140 Type 2 diabetes mellitus with diabetic neuropathy, unspecified: Secondary | ICD-10-CM | POA: Diagnosis not present

## 2024-04-24 DIAGNOSIS — G4700 Insomnia, unspecified: Secondary | ICD-10-CM | POA: Diagnosis not present

## 2024-04-24 DIAGNOSIS — E876 Hypokalemia: Secondary | ICD-10-CM | POA: Diagnosis not present

## 2024-04-24 DIAGNOSIS — D631 Anemia in chronic kidney disease: Secondary | ICD-10-CM | POA: Diagnosis not present

## 2024-04-24 DIAGNOSIS — I69391 Dysphagia following cerebral infarction: Secondary | ICD-10-CM | POA: Diagnosis not present

## 2024-04-24 DIAGNOSIS — I69354 Hemiplegia and hemiparesis following cerebral infarction affecting left non-dominant side: Secondary | ICD-10-CM | POA: Diagnosis not present

## 2024-04-24 DIAGNOSIS — L89152 Pressure ulcer of sacral region, stage 2: Secondary | ICD-10-CM | POA: Diagnosis not present

## 2024-04-24 DIAGNOSIS — U071 COVID-19: Secondary | ICD-10-CM | POA: Diagnosis not present

## 2024-04-24 DIAGNOSIS — K219 Gastro-esophageal reflux disease without esophagitis: Secondary | ICD-10-CM | POA: Diagnosis not present

## 2024-04-24 DIAGNOSIS — N1832 Chronic kidney disease, stage 3b: Secondary | ICD-10-CM | POA: Diagnosis not present

## 2024-04-24 DIAGNOSIS — E782 Mixed hyperlipidemia: Secondary | ICD-10-CM | POA: Diagnosis not present

## 2024-04-24 DIAGNOSIS — R1311 Dysphagia, oral phase: Secondary | ICD-10-CM | POA: Diagnosis not present

## 2024-04-24 DIAGNOSIS — D529 Folate deficiency anemia, unspecified: Secondary | ICD-10-CM | POA: Diagnosis not present

## 2024-04-24 DIAGNOSIS — F419 Anxiety disorder, unspecified: Secondary | ICD-10-CM | POA: Diagnosis not present

## 2024-04-24 DIAGNOSIS — I129 Hypertensive chronic kidney disease with stage 1 through stage 4 chronic kidney disease, or unspecified chronic kidney disease: Secondary | ICD-10-CM | POA: Diagnosis not present

## 2024-04-24 DIAGNOSIS — K429 Umbilical hernia without obstruction or gangrene: Secondary | ICD-10-CM | POA: Diagnosis not present

## 2024-04-27 DIAGNOSIS — R1311 Dysphagia, oral phase: Secondary | ICD-10-CM | POA: Diagnosis not present

## 2024-04-27 DIAGNOSIS — I129 Hypertensive chronic kidney disease with stage 1 through stage 4 chronic kidney disease, or unspecified chronic kidney disease: Secondary | ICD-10-CM | POA: Diagnosis not present

## 2024-04-27 DIAGNOSIS — M170 Bilateral primary osteoarthritis of knee: Secondary | ICD-10-CM | POA: Diagnosis not present

## 2024-04-27 DIAGNOSIS — D529 Folate deficiency anemia, unspecified: Secondary | ICD-10-CM | POA: Diagnosis not present

## 2024-04-27 DIAGNOSIS — I890 Lymphedema, not elsewhere classified: Secondary | ICD-10-CM | POA: Diagnosis not present

## 2024-04-27 DIAGNOSIS — E876 Hypokalemia: Secondary | ICD-10-CM | POA: Diagnosis not present

## 2024-04-27 DIAGNOSIS — M1A9XX0 Chronic gout, unspecified, without tophus (tophi): Secondary | ICD-10-CM | POA: Diagnosis not present

## 2024-04-27 DIAGNOSIS — E782 Mixed hyperlipidemia: Secondary | ICD-10-CM | POA: Diagnosis not present

## 2024-04-27 DIAGNOSIS — U071 COVID-19: Secondary | ICD-10-CM | POA: Diagnosis not present

## 2024-04-27 DIAGNOSIS — N1832 Chronic kidney disease, stage 3b: Secondary | ICD-10-CM | POA: Diagnosis not present

## 2024-04-27 DIAGNOSIS — E1140 Type 2 diabetes mellitus with diabetic neuropathy, unspecified: Secondary | ICD-10-CM | POA: Diagnosis not present

## 2024-04-27 DIAGNOSIS — M810 Age-related osteoporosis without current pathological fracture: Secondary | ICD-10-CM | POA: Diagnosis not present

## 2024-04-27 DIAGNOSIS — G4700 Insomnia, unspecified: Secondary | ICD-10-CM | POA: Diagnosis not present

## 2024-04-27 DIAGNOSIS — K219 Gastro-esophageal reflux disease without esophagitis: Secondary | ICD-10-CM | POA: Diagnosis not present

## 2024-04-27 DIAGNOSIS — E1122 Type 2 diabetes mellitus with diabetic chronic kidney disease: Secondary | ICD-10-CM | POA: Diagnosis not present

## 2024-04-27 DIAGNOSIS — D631 Anemia in chronic kidney disease: Secondary | ICD-10-CM | POA: Diagnosis not present

## 2024-04-27 DIAGNOSIS — N390 Urinary tract infection, site not specified: Secondary | ICD-10-CM | POA: Diagnosis not present

## 2024-04-27 DIAGNOSIS — F32A Depression, unspecified: Secondary | ICD-10-CM | POA: Diagnosis not present

## 2024-04-27 DIAGNOSIS — L89152 Pressure ulcer of sacral region, stage 2: Secondary | ICD-10-CM | POA: Diagnosis not present

## 2024-04-27 DIAGNOSIS — K429 Umbilical hernia without obstruction or gangrene: Secondary | ICD-10-CM | POA: Diagnosis not present

## 2024-04-27 DIAGNOSIS — I69391 Dysphagia following cerebral infarction: Secondary | ICD-10-CM | POA: Diagnosis not present

## 2024-04-27 DIAGNOSIS — G894 Chronic pain syndrome: Secondary | ICD-10-CM | POA: Diagnosis not present

## 2024-04-27 DIAGNOSIS — I69354 Hemiplegia and hemiparesis following cerebral infarction affecting left non-dominant side: Secondary | ICD-10-CM | POA: Diagnosis not present

## 2024-04-27 DIAGNOSIS — F419 Anxiety disorder, unspecified: Secondary | ICD-10-CM | POA: Diagnosis not present

## 2024-04-29 DIAGNOSIS — M810 Age-related osteoporosis without current pathological fracture: Secondary | ICD-10-CM | POA: Diagnosis not present

## 2024-04-29 DIAGNOSIS — I69354 Hemiplegia and hemiparesis following cerebral infarction affecting left non-dominant side: Secondary | ICD-10-CM | POA: Diagnosis not present

## 2024-04-29 DIAGNOSIS — F419 Anxiety disorder, unspecified: Secondary | ICD-10-CM | POA: Diagnosis not present

## 2024-04-29 DIAGNOSIS — D631 Anemia in chronic kidney disease: Secondary | ICD-10-CM | POA: Diagnosis not present

## 2024-04-29 DIAGNOSIS — E782 Mixed hyperlipidemia: Secondary | ICD-10-CM | POA: Diagnosis not present

## 2024-04-29 DIAGNOSIS — E876 Hypokalemia: Secondary | ICD-10-CM | POA: Diagnosis not present

## 2024-04-29 DIAGNOSIS — L89152 Pressure ulcer of sacral region, stage 2: Secondary | ICD-10-CM | POA: Diagnosis not present

## 2024-04-29 DIAGNOSIS — E1122 Type 2 diabetes mellitus with diabetic chronic kidney disease: Secondary | ICD-10-CM | POA: Diagnosis not present

## 2024-04-29 DIAGNOSIS — N390 Urinary tract infection, site not specified: Secondary | ICD-10-CM | POA: Diagnosis not present

## 2024-04-29 DIAGNOSIS — I129 Hypertensive chronic kidney disease with stage 1 through stage 4 chronic kidney disease, or unspecified chronic kidney disease: Secondary | ICD-10-CM | POA: Diagnosis not present

## 2024-04-29 DIAGNOSIS — E1140 Type 2 diabetes mellitus with diabetic neuropathy, unspecified: Secondary | ICD-10-CM | POA: Diagnosis not present

## 2024-04-29 DIAGNOSIS — R1311 Dysphagia, oral phase: Secondary | ICD-10-CM | POA: Diagnosis not present

## 2024-04-29 DIAGNOSIS — M170 Bilateral primary osteoarthritis of knee: Secondary | ICD-10-CM | POA: Diagnosis not present

## 2024-04-29 DIAGNOSIS — F32A Depression, unspecified: Secondary | ICD-10-CM | POA: Diagnosis not present

## 2024-04-29 DIAGNOSIS — M1A9XX0 Chronic gout, unspecified, without tophus (tophi): Secondary | ICD-10-CM | POA: Diagnosis not present

## 2024-04-29 DIAGNOSIS — G894 Chronic pain syndrome: Secondary | ICD-10-CM | POA: Diagnosis not present

## 2024-04-29 DIAGNOSIS — K219 Gastro-esophageal reflux disease without esophagitis: Secondary | ICD-10-CM | POA: Diagnosis not present

## 2024-04-29 DIAGNOSIS — I890 Lymphedema, not elsewhere classified: Secondary | ICD-10-CM | POA: Diagnosis not present

## 2024-04-29 DIAGNOSIS — U071 COVID-19: Secondary | ICD-10-CM | POA: Diagnosis not present

## 2024-04-29 DIAGNOSIS — G4700 Insomnia, unspecified: Secondary | ICD-10-CM | POA: Diagnosis not present

## 2024-04-29 DIAGNOSIS — N1832 Chronic kidney disease, stage 3b: Secondary | ICD-10-CM | POA: Diagnosis not present

## 2024-04-29 DIAGNOSIS — K429 Umbilical hernia without obstruction or gangrene: Secondary | ICD-10-CM | POA: Diagnosis not present

## 2024-04-29 DIAGNOSIS — D529 Folate deficiency anemia, unspecified: Secondary | ICD-10-CM | POA: Diagnosis not present

## 2024-04-29 DIAGNOSIS — I69391 Dysphagia following cerebral infarction: Secondary | ICD-10-CM | POA: Diagnosis not present

## 2024-04-30 DIAGNOSIS — N182 Chronic kidney disease, stage 2 (mild): Secondary | ICD-10-CM | POA: Diagnosis not present

## 2024-04-30 DIAGNOSIS — E119 Type 2 diabetes mellitus without complications: Secondary | ICD-10-CM | POA: Diagnosis not present

## 2024-04-30 DIAGNOSIS — I69354 Hemiplegia and hemiparesis following cerebral infarction affecting left non-dominant side: Secondary | ICD-10-CM | POA: Diagnosis not present

## 2024-05-01 DIAGNOSIS — M810 Age-related osteoporosis without current pathological fracture: Secondary | ICD-10-CM | POA: Diagnosis not present

## 2024-05-01 DIAGNOSIS — I129 Hypertensive chronic kidney disease with stage 1 through stage 4 chronic kidney disease, or unspecified chronic kidney disease: Secondary | ICD-10-CM | POA: Diagnosis not present

## 2024-05-01 DIAGNOSIS — I890 Lymphedema, not elsewhere classified: Secondary | ICD-10-CM | POA: Diagnosis not present

## 2024-05-01 DIAGNOSIS — F419 Anxiety disorder, unspecified: Secondary | ICD-10-CM | POA: Diagnosis not present

## 2024-05-01 DIAGNOSIS — E1122 Type 2 diabetes mellitus with diabetic chronic kidney disease: Secondary | ICD-10-CM | POA: Diagnosis not present

## 2024-05-01 DIAGNOSIS — M170 Bilateral primary osteoarthritis of knee: Secondary | ICD-10-CM | POA: Diagnosis not present

## 2024-05-01 DIAGNOSIS — U071 COVID-19: Secondary | ICD-10-CM | POA: Diagnosis not present

## 2024-05-01 DIAGNOSIS — K429 Umbilical hernia without obstruction or gangrene: Secondary | ICD-10-CM | POA: Diagnosis not present

## 2024-05-01 DIAGNOSIS — D631 Anemia in chronic kidney disease: Secondary | ICD-10-CM | POA: Diagnosis not present

## 2024-05-01 DIAGNOSIS — R1311 Dysphagia, oral phase: Secondary | ICD-10-CM | POA: Diagnosis not present

## 2024-05-01 DIAGNOSIS — L89152 Pressure ulcer of sacral region, stage 2: Secondary | ICD-10-CM | POA: Diagnosis not present

## 2024-05-01 DIAGNOSIS — F32A Depression, unspecified: Secondary | ICD-10-CM | POA: Diagnosis not present

## 2024-05-01 DIAGNOSIS — N1832 Chronic kidney disease, stage 3b: Secondary | ICD-10-CM | POA: Diagnosis not present

## 2024-05-01 DIAGNOSIS — I69391 Dysphagia following cerebral infarction: Secondary | ICD-10-CM | POA: Diagnosis not present

## 2024-05-01 DIAGNOSIS — D529 Folate deficiency anemia, unspecified: Secondary | ICD-10-CM | POA: Diagnosis not present

## 2024-05-01 DIAGNOSIS — E782 Mixed hyperlipidemia: Secondary | ICD-10-CM | POA: Diagnosis not present

## 2024-05-01 DIAGNOSIS — G894 Chronic pain syndrome: Secondary | ICD-10-CM | POA: Diagnosis not present

## 2024-05-01 DIAGNOSIS — E1140 Type 2 diabetes mellitus with diabetic neuropathy, unspecified: Secondary | ICD-10-CM | POA: Diagnosis not present

## 2024-05-01 DIAGNOSIS — M1A9XX0 Chronic gout, unspecified, without tophus (tophi): Secondary | ICD-10-CM | POA: Diagnosis not present

## 2024-05-01 DIAGNOSIS — E876 Hypokalemia: Secondary | ICD-10-CM | POA: Diagnosis not present

## 2024-05-01 DIAGNOSIS — N390 Urinary tract infection, site not specified: Secondary | ICD-10-CM | POA: Diagnosis not present

## 2024-05-01 DIAGNOSIS — I69354 Hemiplegia and hemiparesis following cerebral infarction affecting left non-dominant side: Secondary | ICD-10-CM | POA: Diagnosis not present

## 2024-05-01 DIAGNOSIS — K219 Gastro-esophageal reflux disease without esophagitis: Secondary | ICD-10-CM | POA: Diagnosis not present

## 2024-05-01 DIAGNOSIS — G4700 Insomnia, unspecified: Secondary | ICD-10-CM | POA: Diagnosis not present

## 2024-05-04 DIAGNOSIS — G894 Chronic pain syndrome: Secondary | ICD-10-CM | POA: Diagnosis not present

## 2024-05-04 DIAGNOSIS — F419 Anxiety disorder, unspecified: Secondary | ICD-10-CM | POA: Diagnosis not present

## 2024-05-04 DIAGNOSIS — D631 Anemia in chronic kidney disease: Secondary | ICD-10-CM | POA: Diagnosis not present

## 2024-05-04 DIAGNOSIS — I69391 Dysphagia following cerebral infarction: Secondary | ICD-10-CM | POA: Diagnosis not present

## 2024-05-04 DIAGNOSIS — K219 Gastro-esophageal reflux disease without esophagitis: Secondary | ICD-10-CM | POA: Diagnosis not present

## 2024-05-04 DIAGNOSIS — E782 Mixed hyperlipidemia: Secondary | ICD-10-CM | POA: Diagnosis not present

## 2024-05-04 DIAGNOSIS — F32A Depression, unspecified: Secondary | ICD-10-CM | POA: Diagnosis not present

## 2024-05-04 DIAGNOSIS — E876 Hypokalemia: Secondary | ICD-10-CM | POA: Diagnosis not present

## 2024-05-04 DIAGNOSIS — L89152 Pressure ulcer of sacral region, stage 2: Secondary | ICD-10-CM | POA: Diagnosis not present

## 2024-05-04 DIAGNOSIS — E1122 Type 2 diabetes mellitus with diabetic chronic kidney disease: Secondary | ICD-10-CM | POA: Diagnosis not present

## 2024-05-04 DIAGNOSIS — U071 COVID-19: Secondary | ICD-10-CM | POA: Diagnosis not present

## 2024-05-04 DIAGNOSIS — I69354 Hemiplegia and hemiparesis following cerebral infarction affecting left non-dominant side: Secondary | ICD-10-CM | POA: Diagnosis not present

## 2024-05-04 DIAGNOSIS — K429 Umbilical hernia without obstruction or gangrene: Secondary | ICD-10-CM | POA: Diagnosis not present

## 2024-05-04 DIAGNOSIS — E1140 Type 2 diabetes mellitus with diabetic neuropathy, unspecified: Secondary | ICD-10-CM | POA: Diagnosis not present

## 2024-05-04 DIAGNOSIS — D529 Folate deficiency anemia, unspecified: Secondary | ICD-10-CM | POA: Diagnosis not present

## 2024-05-04 DIAGNOSIS — G4700 Insomnia, unspecified: Secondary | ICD-10-CM | POA: Diagnosis not present

## 2024-05-04 DIAGNOSIS — R1311 Dysphagia, oral phase: Secondary | ICD-10-CM | POA: Diagnosis not present

## 2024-05-04 DIAGNOSIS — I129 Hypertensive chronic kidney disease with stage 1 through stage 4 chronic kidney disease, or unspecified chronic kidney disease: Secondary | ICD-10-CM | POA: Diagnosis not present

## 2024-05-04 DIAGNOSIS — N1832 Chronic kidney disease, stage 3b: Secondary | ICD-10-CM | POA: Diagnosis not present

## 2024-05-04 DIAGNOSIS — N390 Urinary tract infection, site not specified: Secondary | ICD-10-CM | POA: Diagnosis not present

## 2024-05-04 DIAGNOSIS — M170 Bilateral primary osteoarthritis of knee: Secondary | ICD-10-CM | POA: Diagnosis not present

## 2024-05-04 DIAGNOSIS — M810 Age-related osteoporosis without current pathological fracture: Secondary | ICD-10-CM | POA: Diagnosis not present

## 2024-05-04 DIAGNOSIS — I890 Lymphedema, not elsewhere classified: Secondary | ICD-10-CM | POA: Diagnosis not present

## 2024-05-04 DIAGNOSIS — M1A9XX0 Chronic gout, unspecified, without tophus (tophi): Secondary | ICD-10-CM | POA: Diagnosis not present

## 2024-05-06 DIAGNOSIS — F419 Anxiety disorder, unspecified: Secondary | ICD-10-CM | POA: Diagnosis not present

## 2024-05-06 DIAGNOSIS — D631 Anemia in chronic kidney disease: Secondary | ICD-10-CM | POA: Diagnosis not present

## 2024-05-06 DIAGNOSIS — I69391 Dysphagia following cerebral infarction: Secondary | ICD-10-CM | POA: Diagnosis not present

## 2024-05-06 DIAGNOSIS — E1140 Type 2 diabetes mellitus with diabetic neuropathy, unspecified: Secondary | ICD-10-CM | POA: Diagnosis not present

## 2024-05-06 DIAGNOSIS — L89152 Pressure ulcer of sacral region, stage 2: Secondary | ICD-10-CM | POA: Diagnosis not present

## 2024-05-06 DIAGNOSIS — I69354 Hemiplegia and hemiparesis following cerebral infarction affecting left non-dominant side: Secondary | ICD-10-CM | POA: Diagnosis not present

## 2024-05-06 DIAGNOSIS — E1122 Type 2 diabetes mellitus with diabetic chronic kidney disease: Secondary | ICD-10-CM | POA: Diagnosis not present

## 2024-05-06 DIAGNOSIS — M170 Bilateral primary osteoarthritis of knee: Secondary | ICD-10-CM | POA: Diagnosis not present

## 2024-05-06 DIAGNOSIS — M1A9XX0 Chronic gout, unspecified, without tophus (tophi): Secondary | ICD-10-CM | POA: Diagnosis not present

## 2024-05-06 DIAGNOSIS — E782 Mixed hyperlipidemia: Secondary | ICD-10-CM | POA: Diagnosis not present

## 2024-05-06 DIAGNOSIS — U071 COVID-19: Secondary | ICD-10-CM | POA: Diagnosis not present

## 2024-05-06 DIAGNOSIS — N390 Urinary tract infection, site not specified: Secondary | ICD-10-CM | POA: Diagnosis not present

## 2024-05-06 DIAGNOSIS — N1832 Chronic kidney disease, stage 3b: Secondary | ICD-10-CM | POA: Diagnosis not present

## 2024-05-06 DIAGNOSIS — R1311 Dysphagia, oral phase: Secondary | ICD-10-CM | POA: Diagnosis not present

## 2024-05-06 DIAGNOSIS — F32A Depression, unspecified: Secondary | ICD-10-CM | POA: Diagnosis not present

## 2024-05-06 DIAGNOSIS — E876 Hypokalemia: Secondary | ICD-10-CM | POA: Diagnosis not present

## 2024-05-06 DIAGNOSIS — I129 Hypertensive chronic kidney disease with stage 1 through stage 4 chronic kidney disease, or unspecified chronic kidney disease: Secondary | ICD-10-CM | POA: Diagnosis not present

## 2024-05-06 DIAGNOSIS — K219 Gastro-esophageal reflux disease without esophagitis: Secondary | ICD-10-CM | POA: Diagnosis not present

## 2024-05-06 DIAGNOSIS — G894 Chronic pain syndrome: Secondary | ICD-10-CM | POA: Diagnosis not present

## 2024-05-06 DIAGNOSIS — D529 Folate deficiency anemia, unspecified: Secondary | ICD-10-CM | POA: Diagnosis not present

## 2024-05-06 DIAGNOSIS — M810 Age-related osteoporosis without current pathological fracture: Secondary | ICD-10-CM | POA: Diagnosis not present

## 2024-05-06 DIAGNOSIS — G4700 Insomnia, unspecified: Secondary | ICD-10-CM | POA: Diagnosis not present

## 2024-05-06 DIAGNOSIS — I890 Lymphedema, not elsewhere classified: Secondary | ICD-10-CM | POA: Diagnosis not present

## 2024-05-06 DIAGNOSIS — K429 Umbilical hernia without obstruction or gangrene: Secondary | ICD-10-CM | POA: Diagnosis not present

## 2024-05-07 ENCOUNTER — Ambulatory Visit

## 2024-05-12 DIAGNOSIS — K219 Gastro-esophageal reflux disease without esophagitis: Secondary | ICD-10-CM | POA: Diagnosis not present

## 2024-05-12 DIAGNOSIS — K429 Umbilical hernia without obstruction or gangrene: Secondary | ICD-10-CM | POA: Diagnosis not present

## 2024-05-12 DIAGNOSIS — M170 Bilateral primary osteoarthritis of knee: Secondary | ICD-10-CM | POA: Diagnosis not present

## 2024-05-12 DIAGNOSIS — E782 Mixed hyperlipidemia: Secondary | ICD-10-CM | POA: Diagnosis not present

## 2024-05-12 DIAGNOSIS — G894 Chronic pain syndrome: Secondary | ICD-10-CM | POA: Diagnosis not present

## 2024-05-12 DIAGNOSIS — E1122 Type 2 diabetes mellitus with diabetic chronic kidney disease: Secondary | ICD-10-CM | POA: Diagnosis not present

## 2024-05-12 DIAGNOSIS — N1832 Chronic kidney disease, stage 3b: Secondary | ICD-10-CM | POA: Diagnosis not present

## 2024-05-12 DIAGNOSIS — D529 Folate deficiency anemia, unspecified: Secondary | ICD-10-CM | POA: Diagnosis not present

## 2024-05-12 DIAGNOSIS — I129 Hypertensive chronic kidney disease with stage 1 through stage 4 chronic kidney disease, or unspecified chronic kidney disease: Secondary | ICD-10-CM | POA: Diagnosis not present

## 2024-05-12 DIAGNOSIS — I69354 Hemiplegia and hemiparesis following cerebral infarction affecting left non-dominant side: Secondary | ICD-10-CM | POA: Diagnosis not present

## 2024-05-12 DIAGNOSIS — F419 Anxiety disorder, unspecified: Secondary | ICD-10-CM | POA: Diagnosis not present

## 2024-05-12 DIAGNOSIS — M810 Age-related osteoporosis without current pathological fracture: Secondary | ICD-10-CM | POA: Diagnosis not present

## 2024-05-12 DIAGNOSIS — D631 Anemia in chronic kidney disease: Secondary | ICD-10-CM | POA: Diagnosis not present

## 2024-05-12 DIAGNOSIS — I69391 Dysphagia following cerebral infarction: Secondary | ICD-10-CM | POA: Diagnosis not present

## 2024-05-12 DIAGNOSIS — F32A Depression, unspecified: Secondary | ICD-10-CM | POA: Diagnosis not present

## 2024-05-12 DIAGNOSIS — E1140 Type 2 diabetes mellitus with diabetic neuropathy, unspecified: Secondary | ICD-10-CM | POA: Diagnosis not present

## 2024-05-12 DIAGNOSIS — U071 COVID-19: Secondary | ICD-10-CM | POA: Diagnosis not present

## 2024-05-12 DIAGNOSIS — G4700 Insomnia, unspecified: Secondary | ICD-10-CM | POA: Diagnosis not present

## 2024-05-12 DIAGNOSIS — N390 Urinary tract infection, site not specified: Secondary | ICD-10-CM | POA: Diagnosis not present

## 2024-05-12 DIAGNOSIS — E876 Hypokalemia: Secondary | ICD-10-CM | POA: Diagnosis not present

## 2024-05-12 DIAGNOSIS — L89152 Pressure ulcer of sacral region, stage 2: Secondary | ICD-10-CM | POA: Diagnosis not present

## 2024-05-12 DIAGNOSIS — I890 Lymphedema, not elsewhere classified: Secondary | ICD-10-CM | POA: Diagnosis not present

## 2024-05-12 DIAGNOSIS — M1A9XX0 Chronic gout, unspecified, without tophus (tophi): Secondary | ICD-10-CM | POA: Diagnosis not present

## 2024-05-12 DIAGNOSIS — R1311 Dysphagia, oral phase: Secondary | ICD-10-CM | POA: Diagnosis not present

## 2024-05-13 DIAGNOSIS — R1311 Dysphagia, oral phase: Secondary | ICD-10-CM | POA: Diagnosis not present

## 2024-05-13 DIAGNOSIS — M1A9XX0 Chronic gout, unspecified, without tophus (tophi): Secondary | ICD-10-CM | POA: Diagnosis not present

## 2024-05-13 DIAGNOSIS — M810 Age-related osteoporosis without current pathological fracture: Secondary | ICD-10-CM | POA: Diagnosis not present

## 2024-05-13 DIAGNOSIS — G894 Chronic pain syndrome: Secondary | ICD-10-CM | POA: Diagnosis not present

## 2024-05-13 DIAGNOSIS — L89152 Pressure ulcer of sacral region, stage 2: Secondary | ICD-10-CM | POA: Diagnosis not present

## 2024-05-13 DIAGNOSIS — F32A Depression, unspecified: Secondary | ICD-10-CM | POA: Diagnosis not present

## 2024-05-13 DIAGNOSIS — I69354 Hemiplegia and hemiparesis following cerebral infarction affecting left non-dominant side: Secondary | ICD-10-CM | POA: Diagnosis not present

## 2024-05-13 DIAGNOSIS — I129 Hypertensive chronic kidney disease with stage 1 through stage 4 chronic kidney disease, or unspecified chronic kidney disease: Secondary | ICD-10-CM | POA: Diagnosis not present

## 2024-05-13 DIAGNOSIS — E1122 Type 2 diabetes mellitus with diabetic chronic kidney disease: Secondary | ICD-10-CM | POA: Diagnosis not present

## 2024-05-13 DIAGNOSIS — M170 Bilateral primary osteoarthritis of knee: Secondary | ICD-10-CM | POA: Diagnosis not present

## 2024-05-13 DIAGNOSIS — E876 Hypokalemia: Secondary | ICD-10-CM | POA: Diagnosis not present

## 2024-05-13 DIAGNOSIS — D529 Folate deficiency anemia, unspecified: Secondary | ICD-10-CM | POA: Diagnosis not present

## 2024-05-13 DIAGNOSIS — N1832 Chronic kidney disease, stage 3b: Secondary | ICD-10-CM | POA: Diagnosis not present

## 2024-05-13 DIAGNOSIS — E1140 Type 2 diabetes mellitus with diabetic neuropathy, unspecified: Secondary | ICD-10-CM | POA: Diagnosis not present

## 2024-05-13 DIAGNOSIS — G4700 Insomnia, unspecified: Secondary | ICD-10-CM | POA: Diagnosis not present

## 2024-05-13 DIAGNOSIS — D631 Anemia in chronic kidney disease: Secondary | ICD-10-CM | POA: Diagnosis not present

## 2024-05-13 DIAGNOSIS — I890 Lymphedema, not elsewhere classified: Secondary | ICD-10-CM | POA: Diagnosis not present

## 2024-05-13 DIAGNOSIS — N390 Urinary tract infection, site not specified: Secondary | ICD-10-CM | POA: Diagnosis not present

## 2024-05-13 DIAGNOSIS — K429 Umbilical hernia without obstruction or gangrene: Secondary | ICD-10-CM | POA: Diagnosis not present

## 2024-05-13 DIAGNOSIS — I69391 Dysphagia following cerebral infarction: Secondary | ICD-10-CM | POA: Diagnosis not present

## 2024-05-13 DIAGNOSIS — F419 Anxiety disorder, unspecified: Secondary | ICD-10-CM | POA: Diagnosis not present

## 2024-05-13 DIAGNOSIS — E782 Mixed hyperlipidemia: Secondary | ICD-10-CM | POA: Diagnosis not present

## 2024-05-13 DIAGNOSIS — U071 COVID-19: Secondary | ICD-10-CM | POA: Diagnosis not present

## 2024-05-13 DIAGNOSIS — K219 Gastro-esophageal reflux disease without esophagitis: Secondary | ICD-10-CM | POA: Diagnosis not present

## 2024-05-17 DIAGNOSIS — L89152 Pressure ulcer of sacral region, stage 2: Secondary | ICD-10-CM | POA: Diagnosis not present

## 2024-05-17 DIAGNOSIS — M1A9XX0 Chronic gout, unspecified, without tophus (tophi): Secondary | ICD-10-CM | POA: Diagnosis not present

## 2024-05-17 DIAGNOSIS — E876 Hypokalemia: Secondary | ICD-10-CM | POA: Diagnosis not present

## 2024-05-17 DIAGNOSIS — E782 Mixed hyperlipidemia: Secondary | ICD-10-CM | POA: Diagnosis not present

## 2024-05-17 DIAGNOSIS — U071 COVID-19: Secondary | ICD-10-CM | POA: Diagnosis not present

## 2024-05-17 DIAGNOSIS — I69391 Dysphagia following cerebral infarction: Secondary | ICD-10-CM | POA: Diagnosis not present

## 2024-05-17 DIAGNOSIS — G894 Chronic pain syndrome: Secondary | ICD-10-CM | POA: Diagnosis not present

## 2024-05-17 DIAGNOSIS — E1122 Type 2 diabetes mellitus with diabetic chronic kidney disease: Secondary | ICD-10-CM | POA: Diagnosis not present

## 2024-05-17 DIAGNOSIS — R1311 Dysphagia, oral phase: Secondary | ICD-10-CM | POA: Diagnosis not present

## 2024-05-17 DIAGNOSIS — F32A Depression, unspecified: Secondary | ICD-10-CM | POA: Diagnosis not present

## 2024-05-17 DIAGNOSIS — K429 Umbilical hernia without obstruction or gangrene: Secondary | ICD-10-CM | POA: Diagnosis not present

## 2024-05-17 DIAGNOSIS — D529 Folate deficiency anemia, unspecified: Secondary | ICD-10-CM | POA: Diagnosis not present

## 2024-05-17 DIAGNOSIS — N390 Urinary tract infection, site not specified: Secondary | ICD-10-CM | POA: Diagnosis not present

## 2024-05-17 DIAGNOSIS — D631 Anemia in chronic kidney disease: Secondary | ICD-10-CM | POA: Diagnosis not present

## 2024-05-17 DIAGNOSIS — M170 Bilateral primary osteoarthritis of knee: Secondary | ICD-10-CM | POA: Diagnosis not present

## 2024-05-17 DIAGNOSIS — I69354 Hemiplegia and hemiparesis following cerebral infarction affecting left non-dominant side: Secondary | ICD-10-CM | POA: Diagnosis not present

## 2024-05-17 DIAGNOSIS — G4700 Insomnia, unspecified: Secondary | ICD-10-CM | POA: Diagnosis not present

## 2024-05-17 DIAGNOSIS — F419 Anxiety disorder, unspecified: Secondary | ICD-10-CM | POA: Diagnosis not present

## 2024-05-17 DIAGNOSIS — I890 Lymphedema, not elsewhere classified: Secondary | ICD-10-CM | POA: Diagnosis not present

## 2024-05-17 DIAGNOSIS — I129 Hypertensive chronic kidney disease with stage 1 through stage 4 chronic kidney disease, or unspecified chronic kidney disease: Secondary | ICD-10-CM | POA: Diagnosis not present

## 2024-05-17 DIAGNOSIS — K219 Gastro-esophageal reflux disease without esophagitis: Secondary | ICD-10-CM | POA: Diagnosis not present

## 2024-05-17 DIAGNOSIS — M810 Age-related osteoporosis without current pathological fracture: Secondary | ICD-10-CM | POA: Diagnosis not present

## 2024-05-17 DIAGNOSIS — E1140 Type 2 diabetes mellitus with diabetic neuropathy, unspecified: Secondary | ICD-10-CM | POA: Diagnosis not present

## 2024-05-17 DIAGNOSIS — N1832 Chronic kidney disease, stage 3b: Secondary | ICD-10-CM | POA: Diagnosis not present

## 2024-05-18 DIAGNOSIS — R1311 Dysphagia, oral phase: Secondary | ICD-10-CM | POA: Diagnosis not present

## 2024-05-18 DIAGNOSIS — M170 Bilateral primary osteoarthritis of knee: Secondary | ICD-10-CM | POA: Diagnosis not present

## 2024-05-18 DIAGNOSIS — E1122 Type 2 diabetes mellitus with diabetic chronic kidney disease: Secondary | ICD-10-CM | POA: Diagnosis not present

## 2024-05-18 DIAGNOSIS — D631 Anemia in chronic kidney disease: Secondary | ICD-10-CM | POA: Diagnosis not present

## 2024-05-18 DIAGNOSIS — I69391 Dysphagia following cerebral infarction: Secondary | ICD-10-CM | POA: Diagnosis not present

## 2024-05-18 DIAGNOSIS — N1832 Chronic kidney disease, stage 3b: Secondary | ICD-10-CM | POA: Diagnosis not present

## 2024-05-18 DIAGNOSIS — I129 Hypertensive chronic kidney disease with stage 1 through stage 4 chronic kidney disease, or unspecified chronic kidney disease: Secondary | ICD-10-CM | POA: Diagnosis not present

## 2024-05-18 DIAGNOSIS — F32A Depression, unspecified: Secondary | ICD-10-CM | POA: Diagnosis not present

## 2024-05-18 DIAGNOSIS — K219 Gastro-esophageal reflux disease without esophagitis: Secondary | ICD-10-CM | POA: Diagnosis not present

## 2024-05-18 DIAGNOSIS — E876 Hypokalemia: Secondary | ICD-10-CM | POA: Diagnosis not present

## 2024-05-18 DIAGNOSIS — E1140 Type 2 diabetes mellitus with diabetic neuropathy, unspecified: Secondary | ICD-10-CM | POA: Diagnosis not present

## 2024-05-18 DIAGNOSIS — I69354 Hemiplegia and hemiparesis following cerebral infarction affecting left non-dominant side: Secondary | ICD-10-CM | POA: Diagnosis not present

## 2024-05-18 DIAGNOSIS — N390 Urinary tract infection, site not specified: Secondary | ICD-10-CM | POA: Diagnosis not present

## 2024-05-18 DIAGNOSIS — I890 Lymphedema, not elsewhere classified: Secondary | ICD-10-CM | POA: Diagnosis not present

## 2024-05-18 DIAGNOSIS — L89152 Pressure ulcer of sacral region, stage 2: Secondary | ICD-10-CM | POA: Diagnosis not present

## 2024-05-18 DIAGNOSIS — G4700 Insomnia, unspecified: Secondary | ICD-10-CM | POA: Diagnosis not present

## 2024-05-18 DIAGNOSIS — U071 COVID-19: Secondary | ICD-10-CM | POA: Diagnosis not present

## 2024-05-18 DIAGNOSIS — D529 Folate deficiency anemia, unspecified: Secondary | ICD-10-CM | POA: Diagnosis not present

## 2024-05-18 DIAGNOSIS — E782 Mixed hyperlipidemia: Secondary | ICD-10-CM | POA: Diagnosis not present

## 2024-05-18 DIAGNOSIS — K429 Umbilical hernia without obstruction or gangrene: Secondary | ICD-10-CM | POA: Diagnosis not present

## 2024-05-18 DIAGNOSIS — G894 Chronic pain syndrome: Secondary | ICD-10-CM | POA: Diagnosis not present

## 2024-05-18 DIAGNOSIS — F419 Anxiety disorder, unspecified: Secondary | ICD-10-CM | POA: Diagnosis not present

## 2024-05-18 DIAGNOSIS — M810 Age-related osteoporosis without current pathological fracture: Secondary | ICD-10-CM | POA: Diagnosis not present

## 2024-05-18 DIAGNOSIS — M1A9XX0 Chronic gout, unspecified, without tophus (tophi): Secondary | ICD-10-CM | POA: Diagnosis not present

## 2024-05-19 DIAGNOSIS — D631 Anemia in chronic kidney disease: Secondary | ICD-10-CM | POA: Diagnosis not present

## 2024-05-19 DIAGNOSIS — M170 Bilateral primary osteoarthritis of knee: Secondary | ICD-10-CM | POA: Diagnosis not present

## 2024-05-19 DIAGNOSIS — E876 Hypokalemia: Secondary | ICD-10-CM | POA: Diagnosis not present

## 2024-05-19 DIAGNOSIS — D529 Folate deficiency anemia, unspecified: Secondary | ICD-10-CM | POA: Diagnosis not present

## 2024-05-19 DIAGNOSIS — N390 Urinary tract infection, site not specified: Secondary | ICD-10-CM | POA: Diagnosis not present

## 2024-05-19 DIAGNOSIS — I129 Hypertensive chronic kidney disease with stage 1 through stage 4 chronic kidney disease, or unspecified chronic kidney disease: Secondary | ICD-10-CM | POA: Diagnosis not present

## 2024-05-19 DIAGNOSIS — G4700 Insomnia, unspecified: Secondary | ICD-10-CM | POA: Diagnosis not present

## 2024-05-19 DIAGNOSIS — R1311 Dysphagia, oral phase: Secondary | ICD-10-CM | POA: Diagnosis not present

## 2024-05-19 DIAGNOSIS — U071 COVID-19: Secondary | ICD-10-CM | POA: Diagnosis not present

## 2024-05-19 DIAGNOSIS — M810 Age-related osteoporosis without current pathological fracture: Secondary | ICD-10-CM | POA: Diagnosis not present

## 2024-05-19 DIAGNOSIS — L89152 Pressure ulcer of sacral region, stage 2: Secondary | ICD-10-CM | POA: Diagnosis not present

## 2024-05-19 DIAGNOSIS — N1832 Chronic kidney disease, stage 3b: Secondary | ICD-10-CM | POA: Diagnosis not present

## 2024-05-19 DIAGNOSIS — F419 Anxiety disorder, unspecified: Secondary | ICD-10-CM | POA: Diagnosis not present

## 2024-05-19 DIAGNOSIS — K429 Umbilical hernia without obstruction or gangrene: Secondary | ICD-10-CM | POA: Diagnosis not present

## 2024-05-19 DIAGNOSIS — I69391 Dysphagia following cerebral infarction: Secondary | ICD-10-CM | POA: Diagnosis not present

## 2024-05-19 DIAGNOSIS — G894 Chronic pain syndrome: Secondary | ICD-10-CM | POA: Diagnosis not present

## 2024-05-19 DIAGNOSIS — E782 Mixed hyperlipidemia: Secondary | ICD-10-CM | POA: Diagnosis not present

## 2024-05-19 DIAGNOSIS — M1A9XX0 Chronic gout, unspecified, without tophus (tophi): Secondary | ICD-10-CM | POA: Diagnosis not present

## 2024-05-19 DIAGNOSIS — K219 Gastro-esophageal reflux disease without esophagitis: Secondary | ICD-10-CM | POA: Diagnosis not present

## 2024-05-19 DIAGNOSIS — I890 Lymphedema, not elsewhere classified: Secondary | ICD-10-CM | POA: Diagnosis not present

## 2024-05-19 DIAGNOSIS — I69354 Hemiplegia and hemiparesis following cerebral infarction affecting left non-dominant side: Secondary | ICD-10-CM | POA: Diagnosis not present

## 2024-05-19 DIAGNOSIS — E1122 Type 2 diabetes mellitus with diabetic chronic kidney disease: Secondary | ICD-10-CM | POA: Diagnosis not present

## 2024-05-19 DIAGNOSIS — E1140 Type 2 diabetes mellitus with diabetic neuropathy, unspecified: Secondary | ICD-10-CM | POA: Diagnosis not present

## 2024-05-19 DIAGNOSIS — F32A Depression, unspecified: Secondary | ICD-10-CM | POA: Diagnosis not present

## 2024-05-20 DIAGNOSIS — K219 Gastro-esophageal reflux disease without esophagitis: Secondary | ICD-10-CM | POA: Diagnosis not present

## 2024-05-20 DIAGNOSIS — D529 Folate deficiency anemia, unspecified: Secondary | ICD-10-CM | POA: Diagnosis not present

## 2024-05-20 DIAGNOSIS — D631 Anemia in chronic kidney disease: Secondary | ICD-10-CM | POA: Diagnosis not present

## 2024-05-20 DIAGNOSIS — M170 Bilateral primary osteoarthritis of knee: Secondary | ICD-10-CM | POA: Diagnosis not present

## 2024-05-20 DIAGNOSIS — I69354 Hemiplegia and hemiparesis following cerebral infarction affecting left non-dominant side: Secondary | ICD-10-CM | POA: Diagnosis not present

## 2024-05-20 DIAGNOSIS — E1122 Type 2 diabetes mellitus with diabetic chronic kidney disease: Secondary | ICD-10-CM | POA: Diagnosis not present

## 2024-05-20 DIAGNOSIS — F419 Anxiety disorder, unspecified: Secondary | ICD-10-CM | POA: Diagnosis not present

## 2024-05-20 DIAGNOSIS — E876 Hypokalemia: Secondary | ICD-10-CM | POA: Diagnosis not present

## 2024-05-20 DIAGNOSIS — I129 Hypertensive chronic kidney disease with stage 1 through stage 4 chronic kidney disease, or unspecified chronic kidney disease: Secondary | ICD-10-CM | POA: Diagnosis not present

## 2024-05-20 DIAGNOSIS — E1140 Type 2 diabetes mellitus with diabetic neuropathy, unspecified: Secondary | ICD-10-CM | POA: Diagnosis not present

## 2024-05-20 DIAGNOSIS — M1A9XX0 Chronic gout, unspecified, without tophus (tophi): Secondary | ICD-10-CM | POA: Diagnosis not present

## 2024-05-20 DIAGNOSIS — N390 Urinary tract infection, site not specified: Secondary | ICD-10-CM | POA: Diagnosis not present

## 2024-05-20 DIAGNOSIS — I69391 Dysphagia following cerebral infarction: Secondary | ICD-10-CM | POA: Diagnosis not present

## 2024-05-20 DIAGNOSIS — K429 Umbilical hernia without obstruction or gangrene: Secondary | ICD-10-CM | POA: Diagnosis not present

## 2024-05-20 DIAGNOSIS — I890 Lymphedema, not elsewhere classified: Secondary | ICD-10-CM | POA: Diagnosis not present

## 2024-05-20 DIAGNOSIS — G894 Chronic pain syndrome: Secondary | ICD-10-CM | POA: Diagnosis not present

## 2024-05-20 DIAGNOSIS — E782 Mixed hyperlipidemia: Secondary | ICD-10-CM | POA: Diagnosis not present

## 2024-05-20 DIAGNOSIS — L89152 Pressure ulcer of sacral region, stage 2: Secondary | ICD-10-CM | POA: Diagnosis not present

## 2024-05-20 DIAGNOSIS — R1311 Dysphagia, oral phase: Secondary | ICD-10-CM | POA: Diagnosis not present

## 2024-05-20 DIAGNOSIS — F32A Depression, unspecified: Secondary | ICD-10-CM | POA: Diagnosis not present

## 2024-05-20 DIAGNOSIS — U071 COVID-19: Secondary | ICD-10-CM | POA: Diagnosis not present

## 2024-05-20 DIAGNOSIS — N1832 Chronic kidney disease, stage 3b: Secondary | ICD-10-CM | POA: Diagnosis not present

## 2024-05-20 DIAGNOSIS — M810 Age-related osteoporosis without current pathological fracture: Secondary | ICD-10-CM | POA: Diagnosis not present

## 2024-05-20 DIAGNOSIS — G4700 Insomnia, unspecified: Secondary | ICD-10-CM | POA: Diagnosis not present

## 2024-05-21 DIAGNOSIS — N1832 Chronic kidney disease, stage 3b: Secondary | ICD-10-CM | POA: Diagnosis not present

## 2024-05-21 DIAGNOSIS — L89152 Pressure ulcer of sacral region, stage 2: Secondary | ICD-10-CM | POA: Diagnosis not present

## 2024-05-21 DIAGNOSIS — I69354 Hemiplegia and hemiparesis following cerebral infarction affecting left non-dominant side: Secondary | ICD-10-CM | POA: Diagnosis not present

## 2024-05-21 DIAGNOSIS — F32A Depression, unspecified: Secondary | ICD-10-CM | POA: Diagnosis not present

## 2024-05-21 DIAGNOSIS — E1140 Type 2 diabetes mellitus with diabetic neuropathy, unspecified: Secondary | ICD-10-CM | POA: Diagnosis not present

## 2024-05-21 DIAGNOSIS — M1A9XX0 Chronic gout, unspecified, without tophus (tophi): Secondary | ICD-10-CM | POA: Diagnosis not present

## 2024-05-21 DIAGNOSIS — N390 Urinary tract infection, site not specified: Secondary | ICD-10-CM | POA: Diagnosis not present

## 2024-05-21 DIAGNOSIS — E1122 Type 2 diabetes mellitus with diabetic chronic kidney disease: Secondary | ICD-10-CM | POA: Diagnosis not present

## 2024-05-21 DIAGNOSIS — G4700 Insomnia, unspecified: Secondary | ICD-10-CM | POA: Diagnosis not present

## 2024-05-21 DIAGNOSIS — F419 Anxiety disorder, unspecified: Secondary | ICD-10-CM | POA: Diagnosis not present

## 2024-05-21 DIAGNOSIS — I69391 Dysphagia following cerebral infarction: Secondary | ICD-10-CM | POA: Diagnosis not present

## 2024-05-21 DIAGNOSIS — M810 Age-related osteoporosis without current pathological fracture: Secondary | ICD-10-CM | POA: Diagnosis not present

## 2024-05-21 DIAGNOSIS — I129 Hypertensive chronic kidney disease with stage 1 through stage 4 chronic kidney disease, or unspecified chronic kidney disease: Secondary | ICD-10-CM | POA: Diagnosis not present

## 2024-05-21 DIAGNOSIS — E782 Mixed hyperlipidemia: Secondary | ICD-10-CM | POA: Diagnosis not present

## 2024-05-21 DIAGNOSIS — K219 Gastro-esophageal reflux disease without esophagitis: Secondary | ICD-10-CM | POA: Diagnosis not present

## 2024-05-21 DIAGNOSIS — E876 Hypokalemia: Secondary | ICD-10-CM | POA: Diagnosis not present

## 2024-05-21 DIAGNOSIS — G894 Chronic pain syndrome: Secondary | ICD-10-CM | POA: Diagnosis not present

## 2024-05-21 DIAGNOSIS — R1311 Dysphagia, oral phase: Secondary | ICD-10-CM | POA: Diagnosis not present

## 2024-05-21 DIAGNOSIS — U071 COVID-19: Secondary | ICD-10-CM | POA: Diagnosis not present

## 2024-05-21 DIAGNOSIS — D529 Folate deficiency anemia, unspecified: Secondary | ICD-10-CM | POA: Diagnosis not present

## 2024-05-21 DIAGNOSIS — D631 Anemia in chronic kidney disease: Secondary | ICD-10-CM | POA: Diagnosis not present

## 2024-05-21 DIAGNOSIS — M170 Bilateral primary osteoarthritis of knee: Secondary | ICD-10-CM | POA: Diagnosis not present

## 2024-05-21 DIAGNOSIS — K429 Umbilical hernia without obstruction or gangrene: Secondary | ICD-10-CM | POA: Diagnosis not present

## 2024-05-21 DIAGNOSIS — I890 Lymphedema, not elsewhere classified: Secondary | ICD-10-CM | POA: Diagnosis not present

## 2024-05-22 DIAGNOSIS — D529 Folate deficiency anemia, unspecified: Secondary | ICD-10-CM | POA: Diagnosis not present

## 2024-05-22 DIAGNOSIS — K219 Gastro-esophageal reflux disease without esophagitis: Secondary | ICD-10-CM | POA: Diagnosis not present

## 2024-05-22 DIAGNOSIS — I890 Lymphedema, not elsewhere classified: Secondary | ICD-10-CM | POA: Diagnosis not present

## 2024-05-22 DIAGNOSIS — N1832 Chronic kidney disease, stage 3b: Secondary | ICD-10-CM | POA: Diagnosis not present

## 2024-05-22 DIAGNOSIS — M1A9XX0 Chronic gout, unspecified, without tophus (tophi): Secondary | ICD-10-CM | POA: Diagnosis not present

## 2024-05-22 DIAGNOSIS — E1122 Type 2 diabetes mellitus with diabetic chronic kidney disease: Secondary | ICD-10-CM | POA: Diagnosis not present

## 2024-05-22 DIAGNOSIS — R1311 Dysphagia, oral phase: Secondary | ICD-10-CM | POA: Diagnosis not present

## 2024-05-22 DIAGNOSIS — G4700 Insomnia, unspecified: Secondary | ICD-10-CM | POA: Diagnosis not present

## 2024-05-22 DIAGNOSIS — F419 Anxiety disorder, unspecified: Secondary | ICD-10-CM | POA: Diagnosis not present

## 2024-05-22 DIAGNOSIS — I69391 Dysphagia following cerebral infarction: Secondary | ICD-10-CM | POA: Diagnosis not present

## 2024-05-22 DIAGNOSIS — F32A Depression, unspecified: Secondary | ICD-10-CM | POA: Diagnosis not present

## 2024-05-22 DIAGNOSIS — N390 Urinary tract infection, site not specified: Secondary | ICD-10-CM | POA: Diagnosis not present

## 2024-05-22 DIAGNOSIS — L89152 Pressure ulcer of sacral region, stage 2: Secondary | ICD-10-CM | POA: Diagnosis not present

## 2024-05-22 DIAGNOSIS — D631 Anemia in chronic kidney disease: Secondary | ICD-10-CM | POA: Diagnosis not present

## 2024-05-22 DIAGNOSIS — E782 Mixed hyperlipidemia: Secondary | ICD-10-CM | POA: Diagnosis not present

## 2024-05-22 DIAGNOSIS — E876 Hypokalemia: Secondary | ICD-10-CM | POA: Diagnosis not present

## 2024-05-22 DIAGNOSIS — I69354 Hemiplegia and hemiparesis following cerebral infarction affecting left non-dominant side: Secondary | ICD-10-CM | POA: Diagnosis not present

## 2024-05-22 DIAGNOSIS — M170 Bilateral primary osteoarthritis of knee: Secondary | ICD-10-CM | POA: Diagnosis not present

## 2024-05-22 DIAGNOSIS — G894 Chronic pain syndrome: Secondary | ICD-10-CM | POA: Diagnosis not present

## 2024-05-22 DIAGNOSIS — K429 Umbilical hernia without obstruction or gangrene: Secondary | ICD-10-CM | POA: Diagnosis not present

## 2024-05-22 DIAGNOSIS — E1140 Type 2 diabetes mellitus with diabetic neuropathy, unspecified: Secondary | ICD-10-CM | POA: Diagnosis not present

## 2024-05-22 DIAGNOSIS — M810 Age-related osteoporosis without current pathological fracture: Secondary | ICD-10-CM | POA: Diagnosis not present

## 2024-05-22 DIAGNOSIS — I129 Hypertensive chronic kidney disease with stage 1 through stage 4 chronic kidney disease, or unspecified chronic kidney disease: Secondary | ICD-10-CM | POA: Diagnosis not present

## 2024-05-22 DIAGNOSIS — U071 COVID-19: Secondary | ICD-10-CM | POA: Diagnosis not present

## 2024-05-25 DIAGNOSIS — F32A Depression, unspecified: Secondary | ICD-10-CM | POA: Diagnosis not present

## 2024-05-25 DIAGNOSIS — M170 Bilateral primary osteoarthritis of knee: Secondary | ICD-10-CM | POA: Diagnosis not present

## 2024-05-25 DIAGNOSIS — M810 Age-related osteoporosis without current pathological fracture: Secondary | ICD-10-CM | POA: Diagnosis not present

## 2024-05-25 DIAGNOSIS — R1311 Dysphagia, oral phase: Secondary | ICD-10-CM | POA: Diagnosis not present

## 2024-05-25 DIAGNOSIS — I69354 Hemiplegia and hemiparesis following cerebral infarction affecting left non-dominant side: Secondary | ICD-10-CM | POA: Diagnosis not present

## 2024-05-25 DIAGNOSIS — K429 Umbilical hernia without obstruction or gangrene: Secondary | ICD-10-CM | POA: Diagnosis not present

## 2024-05-25 DIAGNOSIS — G894 Chronic pain syndrome: Secondary | ICD-10-CM | POA: Diagnosis not present

## 2024-05-25 DIAGNOSIS — I890 Lymphedema, not elsewhere classified: Secondary | ICD-10-CM | POA: Diagnosis not present

## 2024-05-25 DIAGNOSIS — E876 Hypokalemia: Secondary | ICD-10-CM | POA: Diagnosis not present

## 2024-05-25 DIAGNOSIS — L89152 Pressure ulcer of sacral region, stage 2: Secondary | ICD-10-CM | POA: Diagnosis not present

## 2024-05-25 DIAGNOSIS — D631 Anemia in chronic kidney disease: Secondary | ICD-10-CM | POA: Diagnosis not present

## 2024-05-25 DIAGNOSIS — M1A9XX0 Chronic gout, unspecified, without tophus (tophi): Secondary | ICD-10-CM | POA: Diagnosis not present

## 2024-05-25 DIAGNOSIS — E782 Mixed hyperlipidemia: Secondary | ICD-10-CM | POA: Diagnosis not present

## 2024-05-25 DIAGNOSIS — I129 Hypertensive chronic kidney disease with stage 1 through stage 4 chronic kidney disease, or unspecified chronic kidney disease: Secondary | ICD-10-CM | POA: Diagnosis not present

## 2024-05-25 DIAGNOSIS — E1122 Type 2 diabetes mellitus with diabetic chronic kidney disease: Secondary | ICD-10-CM | POA: Diagnosis not present

## 2024-05-25 DIAGNOSIS — N390 Urinary tract infection, site not specified: Secondary | ICD-10-CM | POA: Diagnosis not present

## 2024-05-25 DIAGNOSIS — U071 COVID-19: Secondary | ICD-10-CM | POA: Diagnosis not present

## 2024-05-25 DIAGNOSIS — F419 Anxiety disorder, unspecified: Secondary | ICD-10-CM | POA: Diagnosis not present

## 2024-05-25 DIAGNOSIS — I69391 Dysphagia following cerebral infarction: Secondary | ICD-10-CM | POA: Diagnosis not present

## 2024-05-25 DIAGNOSIS — K219 Gastro-esophageal reflux disease without esophagitis: Secondary | ICD-10-CM | POA: Diagnosis not present

## 2024-05-25 DIAGNOSIS — E1140 Type 2 diabetes mellitus with diabetic neuropathy, unspecified: Secondary | ICD-10-CM | POA: Diagnosis not present

## 2024-05-25 DIAGNOSIS — N1832 Chronic kidney disease, stage 3b: Secondary | ICD-10-CM | POA: Diagnosis not present

## 2024-05-25 DIAGNOSIS — D529 Folate deficiency anemia, unspecified: Secondary | ICD-10-CM | POA: Diagnosis not present

## 2024-05-25 DIAGNOSIS — G4700 Insomnia, unspecified: Secondary | ICD-10-CM | POA: Diagnosis not present

## 2024-05-26 DIAGNOSIS — M1A9XX0 Chronic gout, unspecified, without tophus (tophi): Secondary | ICD-10-CM | POA: Diagnosis not present

## 2024-05-26 DIAGNOSIS — M170 Bilateral primary osteoarthritis of knee: Secondary | ICD-10-CM | POA: Diagnosis not present

## 2024-05-26 DIAGNOSIS — I129 Hypertensive chronic kidney disease with stage 1 through stage 4 chronic kidney disease, or unspecified chronic kidney disease: Secondary | ICD-10-CM | POA: Diagnosis not present

## 2024-05-26 DIAGNOSIS — K219 Gastro-esophageal reflux disease without esophagitis: Secondary | ICD-10-CM | POA: Diagnosis not present

## 2024-05-26 DIAGNOSIS — N1832 Chronic kidney disease, stage 3b: Secondary | ICD-10-CM | POA: Diagnosis not present

## 2024-05-26 DIAGNOSIS — D529 Folate deficiency anemia, unspecified: Secondary | ICD-10-CM | POA: Diagnosis not present

## 2024-05-26 DIAGNOSIS — I890 Lymphedema, not elsewhere classified: Secondary | ICD-10-CM | POA: Diagnosis not present

## 2024-05-26 DIAGNOSIS — E782 Mixed hyperlipidemia: Secondary | ICD-10-CM | POA: Diagnosis not present

## 2024-05-26 DIAGNOSIS — U071 COVID-19: Secondary | ICD-10-CM | POA: Diagnosis not present

## 2024-05-26 DIAGNOSIS — R1311 Dysphagia, oral phase: Secondary | ICD-10-CM | POA: Diagnosis not present

## 2024-05-26 DIAGNOSIS — I69391 Dysphagia following cerebral infarction: Secondary | ICD-10-CM | POA: Diagnosis not present

## 2024-05-26 DIAGNOSIS — E876 Hypokalemia: Secondary | ICD-10-CM | POA: Diagnosis not present

## 2024-05-26 DIAGNOSIS — F32A Depression, unspecified: Secondary | ICD-10-CM | POA: Diagnosis not present

## 2024-05-26 DIAGNOSIS — M810 Age-related osteoporosis without current pathological fracture: Secondary | ICD-10-CM | POA: Diagnosis not present

## 2024-05-26 DIAGNOSIS — I69354 Hemiplegia and hemiparesis following cerebral infarction affecting left non-dominant side: Secondary | ICD-10-CM | POA: Diagnosis not present

## 2024-05-26 DIAGNOSIS — F419 Anxiety disorder, unspecified: Secondary | ICD-10-CM | POA: Diagnosis not present

## 2024-05-26 DIAGNOSIS — G894 Chronic pain syndrome: Secondary | ICD-10-CM | POA: Diagnosis not present

## 2024-05-26 DIAGNOSIS — K429 Umbilical hernia without obstruction or gangrene: Secondary | ICD-10-CM | POA: Diagnosis not present

## 2024-05-26 DIAGNOSIS — E1140 Type 2 diabetes mellitus with diabetic neuropathy, unspecified: Secondary | ICD-10-CM | POA: Diagnosis not present

## 2024-05-26 DIAGNOSIS — D631 Anemia in chronic kidney disease: Secondary | ICD-10-CM | POA: Diagnosis not present

## 2024-05-26 DIAGNOSIS — G4700 Insomnia, unspecified: Secondary | ICD-10-CM | POA: Diagnosis not present

## 2024-05-26 DIAGNOSIS — E1122 Type 2 diabetes mellitus with diabetic chronic kidney disease: Secondary | ICD-10-CM | POA: Diagnosis not present

## 2024-05-26 DIAGNOSIS — N390 Urinary tract infection, site not specified: Secondary | ICD-10-CM | POA: Diagnosis not present

## 2024-05-26 DIAGNOSIS — L89152 Pressure ulcer of sacral region, stage 2: Secondary | ICD-10-CM | POA: Diagnosis not present

## 2024-05-28 DIAGNOSIS — E1140 Type 2 diabetes mellitus with diabetic neuropathy, unspecified: Secondary | ICD-10-CM | POA: Diagnosis not present

## 2024-05-28 DIAGNOSIS — M170 Bilateral primary osteoarthritis of knee: Secondary | ICD-10-CM | POA: Diagnosis not present

## 2024-05-28 DIAGNOSIS — E876 Hypokalemia: Secondary | ICD-10-CM | POA: Diagnosis not present

## 2024-05-28 DIAGNOSIS — F32A Depression, unspecified: Secondary | ICD-10-CM | POA: Diagnosis not present

## 2024-05-28 DIAGNOSIS — R1311 Dysphagia, oral phase: Secondary | ICD-10-CM | POA: Diagnosis not present

## 2024-05-28 DIAGNOSIS — E1122 Type 2 diabetes mellitus with diabetic chronic kidney disease: Secondary | ICD-10-CM | POA: Diagnosis not present

## 2024-05-28 DIAGNOSIS — I69391 Dysphagia following cerebral infarction: Secondary | ICD-10-CM | POA: Diagnosis not present

## 2024-05-28 DIAGNOSIS — G4700 Insomnia, unspecified: Secondary | ICD-10-CM | POA: Diagnosis not present

## 2024-05-28 DIAGNOSIS — N1832 Chronic kidney disease, stage 3b: Secondary | ICD-10-CM | POA: Diagnosis not present

## 2024-05-28 DIAGNOSIS — K429 Umbilical hernia without obstruction or gangrene: Secondary | ICD-10-CM | POA: Diagnosis not present

## 2024-05-28 DIAGNOSIS — L89152 Pressure ulcer of sacral region, stage 2: Secondary | ICD-10-CM | POA: Diagnosis not present

## 2024-05-28 DIAGNOSIS — M810 Age-related osteoporosis without current pathological fracture: Secondary | ICD-10-CM | POA: Diagnosis not present

## 2024-05-28 DIAGNOSIS — D529 Folate deficiency anemia, unspecified: Secondary | ICD-10-CM | POA: Diagnosis not present

## 2024-05-28 DIAGNOSIS — M1A9XX0 Chronic gout, unspecified, without tophus (tophi): Secondary | ICD-10-CM | POA: Diagnosis not present

## 2024-05-28 DIAGNOSIS — G894 Chronic pain syndrome: Secondary | ICD-10-CM | POA: Diagnosis not present

## 2024-05-28 DIAGNOSIS — I69354 Hemiplegia and hemiparesis following cerebral infarction affecting left non-dominant side: Secondary | ICD-10-CM | POA: Diagnosis not present

## 2024-05-28 DIAGNOSIS — I890 Lymphedema, not elsewhere classified: Secondary | ICD-10-CM | POA: Diagnosis not present

## 2024-05-28 DIAGNOSIS — K219 Gastro-esophageal reflux disease without esophagitis: Secondary | ICD-10-CM | POA: Diagnosis not present

## 2024-05-28 DIAGNOSIS — E782 Mixed hyperlipidemia: Secondary | ICD-10-CM | POA: Diagnosis not present

## 2024-05-28 DIAGNOSIS — F419 Anxiety disorder, unspecified: Secondary | ICD-10-CM | POA: Diagnosis not present

## 2024-05-28 DIAGNOSIS — I129 Hypertensive chronic kidney disease with stage 1 through stage 4 chronic kidney disease, or unspecified chronic kidney disease: Secondary | ICD-10-CM | POA: Diagnosis not present

## 2024-05-28 DIAGNOSIS — N390 Urinary tract infection, site not specified: Secondary | ICD-10-CM | POA: Diagnosis not present

## 2024-05-28 DIAGNOSIS — U071 COVID-19: Secondary | ICD-10-CM | POA: Diagnosis not present

## 2024-05-28 DIAGNOSIS — D631 Anemia in chronic kidney disease: Secondary | ICD-10-CM | POA: Diagnosis not present

## 2024-05-29 DIAGNOSIS — K429 Umbilical hernia without obstruction or gangrene: Secondary | ICD-10-CM | POA: Diagnosis not present

## 2024-05-29 DIAGNOSIS — M1A9XX0 Chronic gout, unspecified, without tophus (tophi): Secondary | ICD-10-CM | POA: Diagnosis not present

## 2024-05-29 DIAGNOSIS — G4700 Insomnia, unspecified: Secondary | ICD-10-CM | POA: Diagnosis not present

## 2024-05-29 DIAGNOSIS — M170 Bilateral primary osteoarthritis of knee: Secondary | ICD-10-CM | POA: Diagnosis not present

## 2024-05-29 DIAGNOSIS — L89152 Pressure ulcer of sacral region, stage 2: Secondary | ICD-10-CM | POA: Diagnosis not present

## 2024-05-29 DIAGNOSIS — F32A Depression, unspecified: Secondary | ICD-10-CM | POA: Diagnosis not present

## 2024-05-29 DIAGNOSIS — I69354 Hemiplegia and hemiparesis following cerebral infarction affecting left non-dominant side: Secondary | ICD-10-CM | POA: Diagnosis not present

## 2024-05-29 DIAGNOSIS — I69391 Dysphagia following cerebral infarction: Secondary | ICD-10-CM | POA: Diagnosis not present

## 2024-05-29 DIAGNOSIS — D529 Folate deficiency anemia, unspecified: Secondary | ICD-10-CM | POA: Diagnosis not present

## 2024-05-29 DIAGNOSIS — K219 Gastro-esophageal reflux disease without esophagitis: Secondary | ICD-10-CM | POA: Diagnosis not present

## 2024-05-29 DIAGNOSIS — D631 Anemia in chronic kidney disease: Secondary | ICD-10-CM | POA: Diagnosis not present

## 2024-05-29 DIAGNOSIS — E1122 Type 2 diabetes mellitus with diabetic chronic kidney disease: Secondary | ICD-10-CM | POA: Diagnosis not present

## 2024-05-29 DIAGNOSIS — N390 Urinary tract infection, site not specified: Secondary | ICD-10-CM | POA: Diagnosis not present

## 2024-05-29 DIAGNOSIS — G894 Chronic pain syndrome: Secondary | ICD-10-CM | POA: Diagnosis not present

## 2024-05-29 DIAGNOSIS — N1832 Chronic kidney disease, stage 3b: Secondary | ICD-10-CM | POA: Diagnosis not present

## 2024-05-29 DIAGNOSIS — E1140 Type 2 diabetes mellitus with diabetic neuropathy, unspecified: Secondary | ICD-10-CM | POA: Diagnosis not present

## 2024-05-29 DIAGNOSIS — E876 Hypokalemia: Secondary | ICD-10-CM | POA: Diagnosis not present

## 2024-05-29 DIAGNOSIS — I129 Hypertensive chronic kidney disease with stage 1 through stage 4 chronic kidney disease, or unspecified chronic kidney disease: Secondary | ICD-10-CM | POA: Diagnosis not present

## 2024-05-29 DIAGNOSIS — F419 Anxiety disorder, unspecified: Secondary | ICD-10-CM | POA: Diagnosis not present

## 2024-05-29 DIAGNOSIS — R1311 Dysphagia, oral phase: Secondary | ICD-10-CM | POA: Diagnosis not present

## 2024-05-29 DIAGNOSIS — E782 Mixed hyperlipidemia: Secondary | ICD-10-CM | POA: Diagnosis not present

## 2024-05-29 DIAGNOSIS — I890 Lymphedema, not elsewhere classified: Secondary | ICD-10-CM | POA: Diagnosis not present

## 2024-05-29 DIAGNOSIS — U071 COVID-19: Secondary | ICD-10-CM | POA: Diagnosis not present

## 2024-05-29 DIAGNOSIS — M810 Age-related osteoporosis without current pathological fracture: Secondary | ICD-10-CM | POA: Diagnosis not present

## 2024-06-01 DIAGNOSIS — M1A9XX0 Chronic gout, unspecified, without tophus (tophi): Secondary | ICD-10-CM | POA: Diagnosis not present

## 2024-06-01 DIAGNOSIS — G894 Chronic pain syndrome: Secondary | ICD-10-CM | POA: Diagnosis not present

## 2024-06-01 DIAGNOSIS — L89152 Pressure ulcer of sacral region, stage 2: Secondary | ICD-10-CM | POA: Diagnosis not present

## 2024-06-01 DIAGNOSIS — M810 Age-related osteoporosis without current pathological fracture: Secondary | ICD-10-CM | POA: Diagnosis not present

## 2024-06-01 DIAGNOSIS — E876 Hypokalemia: Secondary | ICD-10-CM | POA: Diagnosis not present

## 2024-06-01 DIAGNOSIS — G4700 Insomnia, unspecified: Secondary | ICD-10-CM | POA: Diagnosis not present

## 2024-06-01 DIAGNOSIS — R1311 Dysphagia, oral phase: Secondary | ICD-10-CM | POA: Diagnosis not present

## 2024-06-01 DIAGNOSIS — E1122 Type 2 diabetes mellitus with diabetic chronic kidney disease: Secondary | ICD-10-CM | POA: Diagnosis not present

## 2024-06-01 DIAGNOSIS — M170 Bilateral primary osteoarthritis of knee: Secondary | ICD-10-CM | POA: Diagnosis not present

## 2024-06-01 DIAGNOSIS — E1140 Type 2 diabetes mellitus with diabetic neuropathy, unspecified: Secondary | ICD-10-CM | POA: Diagnosis not present

## 2024-06-01 DIAGNOSIS — I129 Hypertensive chronic kidney disease with stage 1 through stage 4 chronic kidney disease, or unspecified chronic kidney disease: Secondary | ICD-10-CM | POA: Diagnosis not present

## 2024-06-01 DIAGNOSIS — K429 Umbilical hernia without obstruction or gangrene: Secondary | ICD-10-CM | POA: Diagnosis not present

## 2024-06-01 DIAGNOSIS — U071 COVID-19: Secondary | ICD-10-CM | POA: Diagnosis not present

## 2024-06-01 DIAGNOSIS — F32A Depression, unspecified: Secondary | ICD-10-CM | POA: Diagnosis not present

## 2024-06-01 DIAGNOSIS — I69391 Dysphagia following cerebral infarction: Secondary | ICD-10-CM | POA: Diagnosis not present

## 2024-06-01 DIAGNOSIS — D631 Anemia in chronic kidney disease: Secondary | ICD-10-CM | POA: Diagnosis not present

## 2024-06-01 DIAGNOSIS — I69354 Hemiplegia and hemiparesis following cerebral infarction affecting left non-dominant side: Secondary | ICD-10-CM | POA: Diagnosis not present

## 2024-06-01 DIAGNOSIS — N1832 Chronic kidney disease, stage 3b: Secondary | ICD-10-CM | POA: Diagnosis not present

## 2024-06-01 DIAGNOSIS — I890 Lymphedema, not elsewhere classified: Secondary | ICD-10-CM | POA: Diagnosis not present

## 2024-06-01 DIAGNOSIS — N390 Urinary tract infection, site not specified: Secondary | ICD-10-CM | POA: Diagnosis not present

## 2024-06-01 DIAGNOSIS — D529 Folate deficiency anemia, unspecified: Secondary | ICD-10-CM | POA: Diagnosis not present

## 2024-06-01 DIAGNOSIS — E782 Mixed hyperlipidemia: Secondary | ICD-10-CM | POA: Diagnosis not present

## 2024-06-01 DIAGNOSIS — K219 Gastro-esophageal reflux disease without esophagitis: Secondary | ICD-10-CM | POA: Diagnosis not present

## 2024-06-01 DIAGNOSIS — F419 Anxiety disorder, unspecified: Secondary | ICD-10-CM | POA: Diagnosis not present

## 2024-06-02 DIAGNOSIS — E1122 Type 2 diabetes mellitus with diabetic chronic kidney disease: Secondary | ICD-10-CM | POA: Diagnosis not present

## 2024-06-02 DIAGNOSIS — D529 Folate deficiency anemia, unspecified: Secondary | ICD-10-CM | POA: Diagnosis not present

## 2024-06-02 DIAGNOSIS — E782 Mixed hyperlipidemia: Secondary | ICD-10-CM | POA: Diagnosis not present

## 2024-06-02 DIAGNOSIS — I890 Lymphedema, not elsewhere classified: Secondary | ICD-10-CM | POA: Diagnosis not present

## 2024-06-02 DIAGNOSIS — F32A Depression, unspecified: Secondary | ICD-10-CM | POA: Diagnosis not present

## 2024-06-02 DIAGNOSIS — I69391 Dysphagia following cerebral infarction: Secondary | ICD-10-CM | POA: Diagnosis not present

## 2024-06-02 DIAGNOSIS — E1140 Type 2 diabetes mellitus with diabetic neuropathy, unspecified: Secondary | ICD-10-CM | POA: Diagnosis not present

## 2024-06-02 DIAGNOSIS — F419 Anxiety disorder, unspecified: Secondary | ICD-10-CM | POA: Diagnosis not present

## 2024-06-02 DIAGNOSIS — I69354 Hemiplegia and hemiparesis following cerebral infarction affecting left non-dominant side: Secondary | ICD-10-CM | POA: Diagnosis not present

## 2024-06-02 DIAGNOSIS — K219 Gastro-esophageal reflux disease without esophagitis: Secondary | ICD-10-CM | POA: Diagnosis not present

## 2024-06-02 DIAGNOSIS — U071 COVID-19: Secondary | ICD-10-CM | POA: Diagnosis not present

## 2024-06-02 DIAGNOSIS — N390 Urinary tract infection, site not specified: Secondary | ICD-10-CM | POA: Diagnosis not present

## 2024-06-02 DIAGNOSIS — L89152 Pressure ulcer of sacral region, stage 2: Secondary | ICD-10-CM | POA: Diagnosis not present

## 2024-06-02 DIAGNOSIS — E876 Hypokalemia: Secondary | ICD-10-CM | POA: Diagnosis not present

## 2024-06-02 DIAGNOSIS — N1832 Chronic kidney disease, stage 3b: Secondary | ICD-10-CM | POA: Diagnosis not present

## 2024-06-02 DIAGNOSIS — R1311 Dysphagia, oral phase: Secondary | ICD-10-CM | POA: Diagnosis not present

## 2024-06-02 DIAGNOSIS — M810 Age-related osteoporosis without current pathological fracture: Secondary | ICD-10-CM | POA: Diagnosis not present

## 2024-06-02 DIAGNOSIS — M1A9XX0 Chronic gout, unspecified, without tophus (tophi): Secondary | ICD-10-CM | POA: Diagnosis not present

## 2024-06-02 DIAGNOSIS — G4700 Insomnia, unspecified: Secondary | ICD-10-CM | POA: Diagnosis not present

## 2024-06-02 DIAGNOSIS — I129 Hypertensive chronic kidney disease with stage 1 through stage 4 chronic kidney disease, or unspecified chronic kidney disease: Secondary | ICD-10-CM | POA: Diagnosis not present

## 2024-06-02 DIAGNOSIS — D631 Anemia in chronic kidney disease: Secondary | ICD-10-CM | POA: Diagnosis not present

## 2024-06-02 DIAGNOSIS — M170 Bilateral primary osteoarthritis of knee: Secondary | ICD-10-CM | POA: Diagnosis not present

## 2024-06-02 DIAGNOSIS — G894 Chronic pain syndrome: Secondary | ICD-10-CM | POA: Diagnosis not present

## 2024-06-02 DIAGNOSIS — K429 Umbilical hernia without obstruction or gangrene: Secondary | ICD-10-CM | POA: Diagnosis not present

## 2024-06-03 DIAGNOSIS — I69354 Hemiplegia and hemiparesis following cerebral infarction affecting left non-dominant side: Secondary | ICD-10-CM | POA: Diagnosis not present

## 2024-06-03 DIAGNOSIS — I69391 Dysphagia following cerebral infarction: Secondary | ICD-10-CM | POA: Diagnosis not present

## 2024-06-03 DIAGNOSIS — K219 Gastro-esophageal reflux disease without esophagitis: Secondary | ICD-10-CM | POA: Diagnosis not present

## 2024-06-03 DIAGNOSIS — D529 Folate deficiency anemia, unspecified: Secondary | ICD-10-CM | POA: Diagnosis not present

## 2024-06-03 DIAGNOSIS — N390 Urinary tract infection, site not specified: Secondary | ICD-10-CM | POA: Diagnosis not present

## 2024-06-03 DIAGNOSIS — F32A Depression, unspecified: Secondary | ICD-10-CM | POA: Diagnosis not present

## 2024-06-03 DIAGNOSIS — E782 Mixed hyperlipidemia: Secondary | ICD-10-CM | POA: Diagnosis not present

## 2024-06-03 DIAGNOSIS — I890 Lymphedema, not elsewhere classified: Secondary | ICD-10-CM | POA: Diagnosis not present

## 2024-06-03 DIAGNOSIS — L89152 Pressure ulcer of sacral region, stage 2: Secondary | ICD-10-CM | POA: Diagnosis not present

## 2024-06-03 DIAGNOSIS — M1A9XX0 Chronic gout, unspecified, without tophus (tophi): Secondary | ICD-10-CM | POA: Diagnosis not present

## 2024-06-03 DIAGNOSIS — G894 Chronic pain syndrome: Secondary | ICD-10-CM | POA: Diagnosis not present

## 2024-06-03 DIAGNOSIS — K429 Umbilical hernia without obstruction or gangrene: Secondary | ICD-10-CM | POA: Diagnosis not present

## 2024-06-03 DIAGNOSIS — E1140 Type 2 diabetes mellitus with diabetic neuropathy, unspecified: Secondary | ICD-10-CM | POA: Diagnosis not present

## 2024-06-03 DIAGNOSIS — N1832 Chronic kidney disease, stage 3b: Secondary | ICD-10-CM | POA: Diagnosis not present

## 2024-06-03 DIAGNOSIS — M810 Age-related osteoporosis without current pathological fracture: Secondary | ICD-10-CM | POA: Diagnosis not present

## 2024-06-03 DIAGNOSIS — G4700 Insomnia, unspecified: Secondary | ICD-10-CM | POA: Diagnosis not present

## 2024-06-03 DIAGNOSIS — R1311 Dysphagia, oral phase: Secondary | ICD-10-CM | POA: Diagnosis not present

## 2024-06-03 DIAGNOSIS — I129 Hypertensive chronic kidney disease with stage 1 through stage 4 chronic kidney disease, or unspecified chronic kidney disease: Secondary | ICD-10-CM | POA: Diagnosis not present

## 2024-06-03 DIAGNOSIS — D631 Anemia in chronic kidney disease: Secondary | ICD-10-CM | POA: Diagnosis not present

## 2024-06-03 DIAGNOSIS — E1122 Type 2 diabetes mellitus with diabetic chronic kidney disease: Secondary | ICD-10-CM | POA: Diagnosis not present

## 2024-06-03 DIAGNOSIS — E876 Hypokalemia: Secondary | ICD-10-CM | POA: Diagnosis not present

## 2024-06-03 DIAGNOSIS — U071 COVID-19: Secondary | ICD-10-CM | POA: Diagnosis not present

## 2024-06-03 DIAGNOSIS — F419 Anxiety disorder, unspecified: Secondary | ICD-10-CM | POA: Diagnosis not present

## 2024-06-03 DIAGNOSIS — M170 Bilateral primary osteoarthritis of knee: Secondary | ICD-10-CM | POA: Diagnosis not present

## 2024-06-04 DIAGNOSIS — G4700 Insomnia, unspecified: Secondary | ICD-10-CM | POA: Diagnosis not present

## 2024-06-04 DIAGNOSIS — I69391 Dysphagia following cerebral infarction: Secondary | ICD-10-CM | POA: Diagnosis not present

## 2024-06-04 DIAGNOSIS — G894 Chronic pain syndrome: Secondary | ICD-10-CM | POA: Diagnosis not present

## 2024-06-04 DIAGNOSIS — I129 Hypertensive chronic kidney disease with stage 1 through stage 4 chronic kidney disease, or unspecified chronic kidney disease: Secondary | ICD-10-CM | POA: Diagnosis not present

## 2024-06-04 DIAGNOSIS — D529 Folate deficiency anemia, unspecified: Secondary | ICD-10-CM | POA: Diagnosis not present

## 2024-06-04 DIAGNOSIS — E876 Hypokalemia: Secondary | ICD-10-CM | POA: Diagnosis not present

## 2024-06-04 DIAGNOSIS — K219 Gastro-esophageal reflux disease without esophagitis: Secondary | ICD-10-CM | POA: Diagnosis not present

## 2024-06-04 DIAGNOSIS — U071 COVID-19: Secondary | ICD-10-CM | POA: Diagnosis not present

## 2024-06-04 DIAGNOSIS — M170 Bilateral primary osteoarthritis of knee: Secondary | ICD-10-CM | POA: Diagnosis not present

## 2024-06-04 DIAGNOSIS — E782 Mixed hyperlipidemia: Secondary | ICD-10-CM | POA: Diagnosis not present

## 2024-06-04 DIAGNOSIS — M810 Age-related osteoporosis without current pathological fracture: Secondary | ICD-10-CM | POA: Diagnosis not present

## 2024-06-04 DIAGNOSIS — N390 Urinary tract infection, site not specified: Secondary | ICD-10-CM | POA: Diagnosis not present

## 2024-06-04 DIAGNOSIS — I890 Lymphedema, not elsewhere classified: Secondary | ICD-10-CM | POA: Diagnosis not present

## 2024-06-04 DIAGNOSIS — N1832 Chronic kidney disease, stage 3b: Secondary | ICD-10-CM | POA: Diagnosis not present

## 2024-06-04 DIAGNOSIS — R1311 Dysphagia, oral phase: Secondary | ICD-10-CM | POA: Diagnosis not present

## 2024-06-04 DIAGNOSIS — E1140 Type 2 diabetes mellitus with diabetic neuropathy, unspecified: Secondary | ICD-10-CM | POA: Diagnosis not present

## 2024-06-04 DIAGNOSIS — F32A Depression, unspecified: Secondary | ICD-10-CM | POA: Diagnosis not present

## 2024-06-04 DIAGNOSIS — D631 Anemia in chronic kidney disease: Secondary | ICD-10-CM | POA: Diagnosis not present

## 2024-06-04 DIAGNOSIS — E1122 Type 2 diabetes mellitus with diabetic chronic kidney disease: Secondary | ICD-10-CM | POA: Diagnosis not present

## 2024-06-04 DIAGNOSIS — F419 Anxiety disorder, unspecified: Secondary | ICD-10-CM | POA: Diagnosis not present

## 2024-06-04 DIAGNOSIS — L89152 Pressure ulcer of sacral region, stage 2: Secondary | ICD-10-CM | POA: Diagnosis not present

## 2024-06-04 DIAGNOSIS — M1A9XX0 Chronic gout, unspecified, without tophus (tophi): Secondary | ICD-10-CM | POA: Diagnosis not present

## 2024-06-04 DIAGNOSIS — I69354 Hemiplegia and hemiparesis following cerebral infarction affecting left non-dominant side: Secondary | ICD-10-CM | POA: Diagnosis not present

## 2024-06-04 DIAGNOSIS — K429 Umbilical hernia without obstruction or gangrene: Secondary | ICD-10-CM | POA: Diagnosis not present

## 2024-06-05 NOTE — Progress Notes (Signed)
 Laurie Hunter                                          MRN: [id number]   06/05/2024   The [facility] Quality Team Specialist reviewed this patient medical record for the purposes of chart review for care gap closure. The following were reviewed: chart review for care gap closure-kidney health evaluation for diabetes:eGFR  and uACR.    [facility]BCI Quality Team

## 2024-06-06 DIAGNOSIS — E1140 Type 2 diabetes mellitus with diabetic neuropathy, unspecified: Secondary | ICD-10-CM | POA: Diagnosis not present

## 2024-06-06 DIAGNOSIS — E1122 Type 2 diabetes mellitus with diabetic chronic kidney disease: Secondary | ICD-10-CM | POA: Diagnosis not present

## 2024-06-06 DIAGNOSIS — I69354 Hemiplegia and hemiparesis following cerebral infarction affecting left non-dominant side: Secondary | ICD-10-CM | POA: Diagnosis not present

## 2024-06-06 DIAGNOSIS — N1832 Chronic kidney disease, stage 3b: Secondary | ICD-10-CM | POA: Diagnosis not present

## 2024-06-06 DIAGNOSIS — D529 Folate deficiency anemia, unspecified: Secondary | ICD-10-CM | POA: Diagnosis not present

## 2024-06-06 DIAGNOSIS — M170 Bilateral primary osteoarthritis of knee: Secondary | ICD-10-CM | POA: Diagnosis not present

## 2024-06-06 DIAGNOSIS — L89152 Pressure ulcer of sacral region, stage 2: Secondary | ICD-10-CM | POA: Diagnosis not present

## 2024-06-06 DIAGNOSIS — R1311 Dysphagia, oral phase: Secondary | ICD-10-CM | POA: Diagnosis not present

## 2024-06-06 DIAGNOSIS — K429 Umbilical hernia without obstruction or gangrene: Secondary | ICD-10-CM | POA: Diagnosis not present

## 2024-06-06 DIAGNOSIS — D631 Anemia in chronic kidney disease: Secondary | ICD-10-CM | POA: Diagnosis not present

## 2024-06-06 DIAGNOSIS — F32A Depression, unspecified: Secondary | ICD-10-CM | POA: Diagnosis not present

## 2024-06-06 DIAGNOSIS — G4700 Insomnia, unspecified: Secondary | ICD-10-CM | POA: Diagnosis not present

## 2024-06-06 DIAGNOSIS — I890 Lymphedema, not elsewhere classified: Secondary | ICD-10-CM | POA: Diagnosis not present

## 2024-06-06 DIAGNOSIS — N390 Urinary tract infection, site not specified: Secondary | ICD-10-CM | POA: Diagnosis not present

## 2024-06-06 DIAGNOSIS — M1A9XX0 Chronic gout, unspecified, without tophus (tophi): Secondary | ICD-10-CM | POA: Diagnosis not present

## 2024-06-06 DIAGNOSIS — E876 Hypokalemia: Secondary | ICD-10-CM | POA: Diagnosis not present

## 2024-06-06 DIAGNOSIS — F419 Anxiety disorder, unspecified: Secondary | ICD-10-CM | POA: Diagnosis not present

## 2024-06-06 DIAGNOSIS — I69391 Dysphagia following cerebral infarction: Secondary | ICD-10-CM | POA: Diagnosis not present

## 2024-06-06 DIAGNOSIS — I129 Hypertensive chronic kidney disease with stage 1 through stage 4 chronic kidney disease, or unspecified chronic kidney disease: Secondary | ICD-10-CM | POA: Diagnosis not present

## 2024-06-06 DIAGNOSIS — M810 Age-related osteoporosis without current pathological fracture: Secondary | ICD-10-CM | POA: Diagnosis not present

## 2024-06-06 DIAGNOSIS — E782 Mixed hyperlipidemia: Secondary | ICD-10-CM | POA: Diagnosis not present

## 2024-06-06 DIAGNOSIS — G894 Chronic pain syndrome: Secondary | ICD-10-CM | POA: Diagnosis not present

## 2024-06-06 DIAGNOSIS — K219 Gastro-esophageal reflux disease without esophagitis: Secondary | ICD-10-CM | POA: Diagnosis not present

## 2024-06-06 DIAGNOSIS — U071 COVID-19: Secondary | ICD-10-CM | POA: Diagnosis not present

## 2024-06-08 DIAGNOSIS — M170 Bilateral primary osteoarthritis of knee: Secondary | ICD-10-CM | POA: Diagnosis not present

## 2024-06-08 DIAGNOSIS — G4700 Insomnia, unspecified: Secondary | ICD-10-CM | POA: Diagnosis not present

## 2024-06-08 DIAGNOSIS — K429 Umbilical hernia without obstruction or gangrene: Secondary | ICD-10-CM | POA: Diagnosis not present

## 2024-06-08 DIAGNOSIS — N390 Urinary tract infection, site not specified: Secondary | ICD-10-CM | POA: Diagnosis not present

## 2024-06-08 DIAGNOSIS — I890 Lymphedema, not elsewhere classified: Secondary | ICD-10-CM | POA: Diagnosis not present

## 2024-06-08 DIAGNOSIS — U071 COVID-19: Secondary | ICD-10-CM | POA: Diagnosis not present

## 2024-06-08 DIAGNOSIS — L89152 Pressure ulcer of sacral region, stage 2: Secondary | ICD-10-CM | POA: Diagnosis not present

## 2024-06-08 DIAGNOSIS — I69354 Hemiplegia and hemiparesis following cerebral infarction affecting left non-dominant side: Secondary | ICD-10-CM | POA: Diagnosis not present

## 2024-06-08 DIAGNOSIS — F419 Anxiety disorder, unspecified: Secondary | ICD-10-CM | POA: Diagnosis not present

## 2024-06-08 DIAGNOSIS — F32A Depression, unspecified: Secondary | ICD-10-CM | POA: Diagnosis not present

## 2024-06-08 DIAGNOSIS — K219 Gastro-esophageal reflux disease without esophagitis: Secondary | ICD-10-CM | POA: Diagnosis not present

## 2024-06-08 DIAGNOSIS — D631 Anemia in chronic kidney disease: Secondary | ICD-10-CM | POA: Diagnosis not present

## 2024-06-08 DIAGNOSIS — R1311 Dysphagia, oral phase: Secondary | ICD-10-CM | POA: Diagnosis not present

## 2024-06-08 DIAGNOSIS — I69391 Dysphagia following cerebral infarction: Secondary | ICD-10-CM | POA: Diagnosis not present

## 2024-06-08 DIAGNOSIS — N1832 Chronic kidney disease, stage 3b: Secondary | ICD-10-CM | POA: Diagnosis not present

## 2024-06-08 DIAGNOSIS — M810 Age-related osteoporosis without current pathological fracture: Secondary | ICD-10-CM | POA: Diagnosis not present

## 2024-06-08 DIAGNOSIS — G894 Chronic pain syndrome: Secondary | ICD-10-CM | POA: Diagnosis not present

## 2024-06-08 DIAGNOSIS — M1A9XX0 Chronic gout, unspecified, without tophus (tophi): Secondary | ICD-10-CM | POA: Diagnosis not present

## 2024-06-08 DIAGNOSIS — E876 Hypokalemia: Secondary | ICD-10-CM | POA: Diagnosis not present

## 2024-06-08 DIAGNOSIS — D529 Folate deficiency anemia, unspecified: Secondary | ICD-10-CM | POA: Diagnosis not present

## 2024-06-08 DIAGNOSIS — E782 Mixed hyperlipidemia: Secondary | ICD-10-CM | POA: Diagnosis not present

## 2024-06-08 DIAGNOSIS — I129 Hypertensive chronic kidney disease with stage 1 through stage 4 chronic kidney disease, or unspecified chronic kidney disease: Secondary | ICD-10-CM | POA: Diagnosis not present

## 2024-06-08 DIAGNOSIS — E1122 Type 2 diabetes mellitus with diabetic chronic kidney disease: Secondary | ICD-10-CM | POA: Diagnosis not present

## 2024-06-08 DIAGNOSIS — E1140 Type 2 diabetes mellitus with diabetic neuropathy, unspecified: Secondary | ICD-10-CM | POA: Diagnosis not present

## 2024-06-09 DIAGNOSIS — L89152 Pressure ulcer of sacral region, stage 2: Secondary | ICD-10-CM | POA: Diagnosis not present

## 2024-06-09 DIAGNOSIS — K219 Gastro-esophageal reflux disease without esophagitis: Secondary | ICD-10-CM | POA: Diagnosis not present

## 2024-06-09 DIAGNOSIS — I129 Hypertensive chronic kidney disease with stage 1 through stage 4 chronic kidney disease, or unspecified chronic kidney disease: Secondary | ICD-10-CM | POA: Diagnosis not present

## 2024-06-09 DIAGNOSIS — N1832 Chronic kidney disease, stage 3b: Secondary | ICD-10-CM | POA: Diagnosis not present

## 2024-06-09 DIAGNOSIS — E782 Mixed hyperlipidemia: Secondary | ICD-10-CM | POA: Diagnosis not present

## 2024-06-09 DIAGNOSIS — I69354 Hemiplegia and hemiparesis following cerebral infarction affecting left non-dominant side: Secondary | ICD-10-CM | POA: Diagnosis not present

## 2024-06-09 DIAGNOSIS — E1122 Type 2 diabetes mellitus with diabetic chronic kidney disease: Secondary | ICD-10-CM | POA: Diagnosis not present

## 2024-06-09 DIAGNOSIS — U071 COVID-19: Secondary | ICD-10-CM | POA: Diagnosis not present

## 2024-06-09 DIAGNOSIS — M1A9XX0 Chronic gout, unspecified, without tophus (tophi): Secondary | ICD-10-CM | POA: Diagnosis not present

## 2024-06-09 DIAGNOSIS — M170 Bilateral primary osteoarthritis of knee: Secondary | ICD-10-CM | POA: Diagnosis not present

## 2024-06-09 DIAGNOSIS — M810 Age-related osteoporosis without current pathological fracture: Secondary | ICD-10-CM | POA: Diagnosis not present

## 2024-06-09 DIAGNOSIS — F419 Anxiety disorder, unspecified: Secondary | ICD-10-CM | POA: Diagnosis not present

## 2024-06-09 DIAGNOSIS — G4700 Insomnia, unspecified: Secondary | ICD-10-CM | POA: Diagnosis not present

## 2024-06-09 DIAGNOSIS — K429 Umbilical hernia without obstruction or gangrene: Secondary | ICD-10-CM | POA: Diagnosis not present

## 2024-06-09 DIAGNOSIS — E876 Hypokalemia: Secondary | ICD-10-CM | POA: Diagnosis not present

## 2024-06-09 DIAGNOSIS — D631 Anemia in chronic kidney disease: Secondary | ICD-10-CM | POA: Diagnosis not present

## 2024-06-09 DIAGNOSIS — I69391 Dysphagia following cerebral infarction: Secondary | ICD-10-CM | POA: Diagnosis not present

## 2024-06-09 DIAGNOSIS — E1140 Type 2 diabetes mellitus with diabetic neuropathy, unspecified: Secondary | ICD-10-CM | POA: Diagnosis not present

## 2024-06-09 DIAGNOSIS — D529 Folate deficiency anemia, unspecified: Secondary | ICD-10-CM | POA: Diagnosis not present

## 2024-06-09 DIAGNOSIS — I890 Lymphedema, not elsewhere classified: Secondary | ICD-10-CM | POA: Diagnosis not present

## 2024-06-09 DIAGNOSIS — R1311 Dysphagia, oral phase: Secondary | ICD-10-CM | POA: Diagnosis not present

## 2024-06-09 DIAGNOSIS — F32A Depression, unspecified: Secondary | ICD-10-CM | POA: Diagnosis not present

## 2024-06-09 DIAGNOSIS — G894 Chronic pain syndrome: Secondary | ICD-10-CM | POA: Diagnosis not present

## 2024-06-09 DIAGNOSIS — N390 Urinary tract infection, site not specified: Secondary | ICD-10-CM | POA: Diagnosis not present

## 2024-06-10 DIAGNOSIS — R1311 Dysphagia, oral phase: Secondary | ICD-10-CM | POA: Diagnosis not present

## 2024-06-10 DIAGNOSIS — L89152 Pressure ulcer of sacral region, stage 2: Secondary | ICD-10-CM | POA: Diagnosis not present

## 2024-06-10 DIAGNOSIS — N390 Urinary tract infection, site not specified: Secondary | ICD-10-CM | POA: Diagnosis not present

## 2024-06-10 DIAGNOSIS — M810 Age-related osteoporosis without current pathological fracture: Secondary | ICD-10-CM | POA: Diagnosis not present

## 2024-06-10 DIAGNOSIS — K429 Umbilical hernia without obstruction or gangrene: Secondary | ICD-10-CM | POA: Diagnosis not present

## 2024-06-10 DIAGNOSIS — G894 Chronic pain syndrome: Secondary | ICD-10-CM | POA: Diagnosis not present

## 2024-06-10 DIAGNOSIS — E1140 Type 2 diabetes mellitus with diabetic neuropathy, unspecified: Secondary | ICD-10-CM | POA: Diagnosis not present

## 2024-06-10 DIAGNOSIS — I129 Hypertensive chronic kidney disease with stage 1 through stage 4 chronic kidney disease, or unspecified chronic kidney disease: Secondary | ICD-10-CM | POA: Diagnosis not present

## 2024-06-10 DIAGNOSIS — D631 Anemia in chronic kidney disease: Secondary | ICD-10-CM | POA: Diagnosis not present

## 2024-06-10 DIAGNOSIS — M170 Bilateral primary osteoarthritis of knee: Secondary | ICD-10-CM | POA: Diagnosis not present

## 2024-06-10 DIAGNOSIS — F32A Depression, unspecified: Secondary | ICD-10-CM | POA: Diagnosis not present

## 2024-06-10 DIAGNOSIS — U071 COVID-19: Secondary | ICD-10-CM | POA: Diagnosis not present

## 2024-06-10 DIAGNOSIS — I69391 Dysphagia following cerebral infarction: Secondary | ICD-10-CM | POA: Diagnosis not present

## 2024-06-10 DIAGNOSIS — D529 Folate deficiency anemia, unspecified: Secondary | ICD-10-CM | POA: Diagnosis not present

## 2024-06-10 DIAGNOSIS — I890 Lymphedema, not elsewhere classified: Secondary | ICD-10-CM | POA: Diagnosis not present

## 2024-06-10 DIAGNOSIS — E876 Hypokalemia: Secondary | ICD-10-CM | POA: Diagnosis not present

## 2024-06-10 DIAGNOSIS — N1832 Chronic kidney disease, stage 3b: Secondary | ICD-10-CM | POA: Diagnosis not present

## 2024-06-10 DIAGNOSIS — M1A9XX0 Chronic gout, unspecified, without tophus (tophi): Secondary | ICD-10-CM | POA: Diagnosis not present

## 2024-06-10 DIAGNOSIS — G4700 Insomnia, unspecified: Secondary | ICD-10-CM | POA: Diagnosis not present

## 2024-06-10 DIAGNOSIS — F419 Anxiety disorder, unspecified: Secondary | ICD-10-CM | POA: Diagnosis not present

## 2024-06-10 DIAGNOSIS — I69354 Hemiplegia and hemiparesis following cerebral infarction affecting left non-dominant side: Secondary | ICD-10-CM | POA: Diagnosis not present

## 2024-06-10 DIAGNOSIS — E1122 Type 2 diabetes mellitus with diabetic chronic kidney disease: Secondary | ICD-10-CM | POA: Diagnosis not present

## 2024-06-10 DIAGNOSIS — K219 Gastro-esophageal reflux disease without esophagitis: Secondary | ICD-10-CM | POA: Diagnosis not present

## 2024-06-10 DIAGNOSIS — E782 Mixed hyperlipidemia: Secondary | ICD-10-CM | POA: Diagnosis not present

## 2024-06-12 DIAGNOSIS — U071 COVID-19: Secondary | ICD-10-CM | POA: Diagnosis not present

## 2024-06-12 DIAGNOSIS — I129 Hypertensive chronic kidney disease with stage 1 through stage 4 chronic kidney disease, or unspecified chronic kidney disease: Secondary | ICD-10-CM | POA: Diagnosis not present

## 2024-06-12 DIAGNOSIS — E876 Hypokalemia: Secondary | ICD-10-CM | POA: Diagnosis not present

## 2024-06-12 DIAGNOSIS — K429 Umbilical hernia without obstruction or gangrene: Secondary | ICD-10-CM | POA: Diagnosis not present

## 2024-06-12 DIAGNOSIS — G894 Chronic pain syndrome: Secondary | ICD-10-CM | POA: Diagnosis not present

## 2024-06-12 DIAGNOSIS — F32A Depression, unspecified: Secondary | ICD-10-CM | POA: Diagnosis not present

## 2024-06-12 DIAGNOSIS — E782 Mixed hyperlipidemia: Secondary | ICD-10-CM | POA: Diagnosis not present

## 2024-06-12 DIAGNOSIS — F419 Anxiety disorder, unspecified: Secondary | ICD-10-CM | POA: Diagnosis not present

## 2024-06-12 DIAGNOSIS — I69354 Hemiplegia and hemiparesis following cerebral infarction affecting left non-dominant side: Secondary | ICD-10-CM | POA: Diagnosis not present

## 2024-06-12 DIAGNOSIS — D529 Folate deficiency anemia, unspecified: Secondary | ICD-10-CM | POA: Diagnosis not present

## 2024-06-12 DIAGNOSIS — N390 Urinary tract infection, site not specified: Secondary | ICD-10-CM | POA: Diagnosis not present

## 2024-06-12 DIAGNOSIS — I890 Lymphedema, not elsewhere classified: Secondary | ICD-10-CM | POA: Diagnosis not present

## 2024-06-12 DIAGNOSIS — N1832 Chronic kidney disease, stage 3b: Secondary | ICD-10-CM | POA: Diagnosis not present

## 2024-06-12 DIAGNOSIS — R1311 Dysphagia, oral phase: Secondary | ICD-10-CM | POA: Diagnosis not present

## 2024-06-12 DIAGNOSIS — I69391 Dysphagia following cerebral infarction: Secondary | ICD-10-CM | POA: Diagnosis not present

## 2024-06-12 DIAGNOSIS — K219 Gastro-esophageal reflux disease without esophagitis: Secondary | ICD-10-CM | POA: Diagnosis not present

## 2024-06-12 DIAGNOSIS — G4700 Insomnia, unspecified: Secondary | ICD-10-CM | POA: Diagnosis not present

## 2024-06-12 DIAGNOSIS — M1A9XX0 Chronic gout, unspecified, without tophus (tophi): Secondary | ICD-10-CM | POA: Diagnosis not present

## 2024-06-12 DIAGNOSIS — M170 Bilateral primary osteoarthritis of knee: Secondary | ICD-10-CM | POA: Diagnosis not present

## 2024-06-12 DIAGNOSIS — L89152 Pressure ulcer of sacral region, stage 2: Secondary | ICD-10-CM | POA: Diagnosis not present

## 2024-06-12 DIAGNOSIS — E1140 Type 2 diabetes mellitus with diabetic neuropathy, unspecified: Secondary | ICD-10-CM | POA: Diagnosis not present

## 2024-06-12 DIAGNOSIS — E1122 Type 2 diabetes mellitus with diabetic chronic kidney disease: Secondary | ICD-10-CM | POA: Diagnosis not present

## 2024-06-12 DIAGNOSIS — M810 Age-related osteoporosis without current pathological fracture: Secondary | ICD-10-CM | POA: Diagnosis not present

## 2024-06-12 DIAGNOSIS — D631 Anemia in chronic kidney disease: Secondary | ICD-10-CM | POA: Diagnosis not present

## 2024-06-15 DIAGNOSIS — K219 Gastro-esophageal reflux disease without esophagitis: Secondary | ICD-10-CM | POA: Diagnosis not present

## 2024-06-15 DIAGNOSIS — F419 Anxiety disorder, unspecified: Secondary | ICD-10-CM | POA: Diagnosis not present

## 2024-06-15 DIAGNOSIS — M810 Age-related osteoporosis without current pathological fracture: Secondary | ICD-10-CM | POA: Diagnosis not present

## 2024-06-15 DIAGNOSIS — F32A Depression, unspecified: Secondary | ICD-10-CM | POA: Diagnosis not present

## 2024-06-15 DIAGNOSIS — I890 Lymphedema, not elsewhere classified: Secondary | ICD-10-CM | POA: Diagnosis not present

## 2024-06-15 DIAGNOSIS — I129 Hypertensive chronic kidney disease with stage 1 through stage 4 chronic kidney disease, or unspecified chronic kidney disease: Secondary | ICD-10-CM | POA: Diagnosis not present

## 2024-06-15 DIAGNOSIS — N1832 Chronic kidney disease, stage 3b: Secondary | ICD-10-CM | POA: Diagnosis not present

## 2024-06-15 DIAGNOSIS — U071 COVID-19: Secondary | ICD-10-CM | POA: Diagnosis not present

## 2024-06-15 DIAGNOSIS — L89152 Pressure ulcer of sacral region, stage 2: Secondary | ICD-10-CM | POA: Diagnosis not present

## 2024-06-15 DIAGNOSIS — R1311 Dysphagia, oral phase: Secondary | ICD-10-CM | POA: Diagnosis not present

## 2024-06-15 DIAGNOSIS — N390 Urinary tract infection, site not specified: Secondary | ICD-10-CM | POA: Diagnosis not present

## 2024-06-15 DIAGNOSIS — E1140 Type 2 diabetes mellitus with diabetic neuropathy, unspecified: Secondary | ICD-10-CM | POA: Diagnosis not present

## 2024-06-15 DIAGNOSIS — D529 Folate deficiency anemia, unspecified: Secondary | ICD-10-CM | POA: Diagnosis not present

## 2024-06-15 DIAGNOSIS — E876 Hypokalemia: Secondary | ICD-10-CM | POA: Diagnosis not present

## 2024-06-15 DIAGNOSIS — M1A9XX0 Chronic gout, unspecified, without tophus (tophi): Secondary | ICD-10-CM | POA: Diagnosis not present

## 2024-06-15 DIAGNOSIS — I69354 Hemiplegia and hemiparesis following cerebral infarction affecting left non-dominant side: Secondary | ICD-10-CM | POA: Diagnosis not present

## 2024-06-15 DIAGNOSIS — M170 Bilateral primary osteoarthritis of knee: Secondary | ICD-10-CM | POA: Diagnosis not present

## 2024-06-15 DIAGNOSIS — D631 Anemia in chronic kidney disease: Secondary | ICD-10-CM | POA: Diagnosis not present

## 2024-06-15 DIAGNOSIS — I69391 Dysphagia following cerebral infarction: Secondary | ICD-10-CM | POA: Diagnosis not present

## 2024-06-15 DIAGNOSIS — K429 Umbilical hernia without obstruction or gangrene: Secondary | ICD-10-CM | POA: Diagnosis not present

## 2024-06-15 DIAGNOSIS — E1122 Type 2 diabetes mellitus with diabetic chronic kidney disease: Secondary | ICD-10-CM | POA: Diagnosis not present

## 2024-06-15 DIAGNOSIS — G4700 Insomnia, unspecified: Secondary | ICD-10-CM | POA: Diagnosis not present

## 2024-06-15 DIAGNOSIS — G894 Chronic pain syndrome: Secondary | ICD-10-CM | POA: Diagnosis not present

## 2024-06-15 DIAGNOSIS — E782 Mixed hyperlipidemia: Secondary | ICD-10-CM | POA: Diagnosis not present

## 2024-07-06 ENCOUNTER — Ambulatory Visit (HOSPITAL_COMMUNITY)

## 2024-07-06 ENCOUNTER — Ambulatory Visit

## 2024-11-20 ENCOUNTER — Ambulatory Visit (HOSPITAL_COMMUNITY)

## 2024-11-20 ENCOUNTER — Encounter: Admitting: Vascular Surgery
